# Patient Record
Sex: Male | Born: 1980 | Race: White | Hispanic: No | Marital: Single | Smoking: Former smoker
Health system: Southern US, Community
[De-identification: ages and names within clinical notes are randomized; demographics above are authoritative.]

## PROBLEM LIST (undated history)

## (undated) DIAGNOSIS — F329 Major depressive disorder, single episode, unspecified: Secondary | ICD-10-CM

## (undated) DIAGNOSIS — I10 Essential (primary) hypertension: Secondary | ICD-10-CM

## (undated) DIAGNOSIS — F32A Depression, unspecified: Secondary | ICD-10-CM

## (undated) HISTORY — PX: TONSILLECTOMY: SUR1361

## (undated) HISTORY — DX: Depression, unspecified: F32.A

## (undated) HISTORY — PX: FACIAL RECONSTRUCTION SURGERY: SHX631

## (undated) HISTORY — DX: Major depressive disorder, single episode, unspecified: F32.9

---

## 2001-06-30 ENCOUNTER — Inpatient Hospital Stay (HOSPITAL_COMMUNITY): Admission: EM | Admit: 2001-06-30 | Discharge: 2001-07-05 | Payer: Self-pay | Admitting: Psychiatry

## 2004-08-08 ENCOUNTER — Other Ambulatory Visit: Payer: Self-pay

## 2004-08-08 ENCOUNTER — Emergency Department: Payer: Self-pay | Admitting: Emergency Medicine

## 2008-01-07 ENCOUNTER — Emergency Department: Payer: Self-pay | Admitting: Emergency Medicine

## 2010-10-24 ENCOUNTER — Emergency Department: Payer: Self-pay | Admitting: Emergency Medicine

## 2011-04-08 ENCOUNTER — Emergency Department: Payer: Self-pay | Admitting: Internal Medicine

## 2011-08-18 ENCOUNTER — Encounter: Payer: Self-pay | Admitting: Orthopedic Surgery

## 2011-09-13 ENCOUNTER — Encounter: Payer: Self-pay | Admitting: Orthopedic Surgery

## 2012-09-24 ENCOUNTER — Inpatient Hospital Stay: Payer: Self-pay | Admitting: Psychiatry

## 2012-09-24 LAB — ETHANOL
Ethanol %: 0.153 % — ABNORMAL HIGH (ref 0.000–0.080)
Ethanol: 153 mg/dL

## 2012-09-24 LAB — DRUG SCREEN, URINE
Benzodiazepine, Ur Scrn: NEGATIVE (ref ?–200)
Cannabinoid 50 Ng, Ur ~~LOC~~: NEGATIVE (ref ?–50)
Cocaine Metabolite,Ur ~~LOC~~: NEGATIVE (ref ?–300)
Methadone, Ur Screen: NEGATIVE (ref ?–300)
Opiate, Ur Screen: NEGATIVE (ref ?–300)
Phencyclidine (PCP) Ur S: NEGATIVE (ref ?–25)

## 2012-09-24 LAB — URINALYSIS, COMPLETE
Bilirubin,UR: NEGATIVE
Blood: NEGATIVE
Glucose,UR: NEGATIVE mg/dL (ref 0–75)
Ketone: NEGATIVE
Ph: 5 (ref 4.5–8.0)
Protein: NEGATIVE
Specific Gravity: 1.01 (ref 1.003–1.030)
Squamous Epithelial: NONE SEEN
WBC UR: 1 /HPF (ref 0–5)

## 2012-09-24 LAB — COMPREHENSIVE METABOLIC PANEL
Alkaline Phosphatase: 69 U/L (ref 50–136)
BUN: 21 mg/dL — ABNORMAL HIGH (ref 7–18)
Bilirubin,Total: 0.3 mg/dL (ref 0.2–1.0)
Calcium, Total: 8.6 mg/dL (ref 8.5–10.1)
Chloride: 106 mmol/L (ref 98–107)
Creatinine: 0.95 mg/dL (ref 0.60–1.30)
EGFR (African American): >60
EGFR (Non-African Amer.): >60
Glucose: 123 mg/dL — ABNORMAL HIGH (ref 65–99)
Potassium: 3.5 mmol/L (ref 3.5–5.1)
SGOT(AST): 39 U/L — ABNORMAL HIGH (ref 15–37)
Sodium: 139 mmol/L (ref 136–145)
Total Protein: 7.6 g/dL (ref 6.4–8.2)

## 2012-09-24 LAB — TSH: Thyroid Stimulating Horm: 1.86 u[IU]/mL

## 2012-09-24 LAB — CBC
HCT: 46.7 % (ref 40.0–52.0)
HGB: 15.6 g/dL (ref 13.0–18.0)
MCH: 30.5 pg (ref 26.0–34.0)
MCV: 91 fL (ref 80–100)
Platelet: 252 10*3/uL (ref 150–440)
RBC: 5.11 10*6/uL (ref 4.40–5.90)

## 2012-10-08 ENCOUNTER — Emergency Department: Payer: Self-pay | Admitting: Internal Medicine

## 2012-10-08 ENCOUNTER — Emergency Department: Payer: Self-pay | Admitting: Emergency Medicine

## 2012-10-08 LAB — CBC
MCHC: 34.4 g/dL (ref 32.0–36.0)
Platelet: 269 10*3/uL (ref 150–440)
RDW: 12.5 % (ref 11.5–14.5)

## 2012-10-08 LAB — ETHANOL
Ethanol %: 0.021 % (ref 0.000–0.080)
Ethanol: 21 mg/dL

## 2012-10-08 LAB — DRUG SCREEN, URINE
Amphetamines, Ur Screen: NEGATIVE (ref ?–1000)
Benzodiazepine, Ur Scrn: NEGATIVE (ref ?–200)
Cocaine Metabolite,Ur ~~LOC~~: NEGATIVE (ref ?–300)
Methadone, Ur Screen: NEGATIVE (ref ?–300)
Opiate, Ur Screen: NEGATIVE (ref ?–300)
Phencyclidine (PCP) Ur S: NEGATIVE (ref ?–25)
Tricyclic, Ur Screen: NEGATIVE (ref ?–1000)

## 2012-10-08 LAB — COMPREHENSIVE METABOLIC PANEL
Albumin: 4.5 g/dL (ref 3.4–5.0)
Anion Gap: 7 (ref 7–16)
BUN: 12 mg/dL (ref 7–18)
Calcium, Total: 9.1 mg/dL (ref 8.5–10.1)
Co2: 27 mmol/L (ref 21–32)
Creatinine: 0.78 mg/dL (ref 0.60–1.30)
EGFR (African American): >60
EGFR (Non-African Amer.): >60
Glucose: 101 mg/dL — ABNORMAL HIGH (ref 65–99)
Potassium: 4 mmol/L (ref 3.5–5.1)
SGOT(AST): 28 U/L (ref 15–37)
SGPT (ALT): 25 U/L (ref 12–78)
Sodium: 138 mmol/L (ref 136–145)
Total Protein: 8 g/dL (ref 6.4–8.2)

## 2012-10-08 LAB — ACETAMINOPHEN LEVEL: Acetaminophen: <2 ug/mL

## 2012-10-08 LAB — URINALYSIS, COMPLETE
Bacteria: NONE SEEN
Blood: NEGATIVE
Ketone: NEGATIVE
Protein: NEGATIVE
Specific Gravity: 1.018 (ref 1.003–1.030)

## 2012-10-08 LAB — SALICYLATE LEVEL: Salicylates, Serum: <1.7 mg/dL

## 2012-10-08 LAB — TSH: Thyroid Stimulating Horm: 1.21 u[IU]/mL

## 2013-08-19 ENCOUNTER — Emergency Department: Payer: Self-pay | Admitting: Emergency Medicine

## 2013-08-19 LAB — ACETAMINOPHEN LEVEL: Acetaminophen: <2 ug/mL

## 2013-08-19 LAB — URINALYSIS, COMPLETE
Bilirubin,UR: NEGATIVE
Glucose,UR: NEGATIVE mg/dL (ref 0–75)
KETONE: NEGATIVE
LEUKOCYTE ESTERASE: NEGATIVE
NITRITE: NEGATIVE
PROTEIN: NEGATIVE
Ph: 5 (ref 4.5–8.0)
Specific Gravity: 1.005 (ref 1.003–1.030)

## 2013-08-19 LAB — COMPREHENSIVE METABOLIC PANEL
ALK PHOS: 64 U/L
AST: 44 U/L — AB (ref 15–37)
Albumin: 4 g/dL (ref 3.4–5.0)
Anion Gap: 10 (ref 7–16)
BUN: 16 mg/dL (ref 7–18)
Bilirubin,Total: 0.3 mg/dL (ref 0.2–1.0)
CALCIUM: 7.9 mg/dL — AB (ref 8.5–10.1)
CO2: 23 mmol/L (ref 21–32)
Chloride: 107 mmol/L (ref 98–107)
Creatinine: 0.89 mg/dL (ref 0.60–1.30)
EGFR (Non-African Amer.): >60
Glucose: 119 mg/dL — ABNORMAL HIGH (ref 65–99)
OSMOLALITY: 282 (ref 275–301)
Potassium: 3.7 mmol/L (ref 3.5–5.1)
SGPT (ALT): 40 U/L (ref 12–78)
SODIUM: 140 mmol/L (ref 136–145)
TOTAL PROTEIN: 7.5 g/dL (ref 6.4–8.2)

## 2013-08-19 LAB — CBC
HCT: 47.7 % (ref 40.0–52.0)
HGB: 16.1 g/dL (ref 13.0–18.0)
MCH: 31 pg (ref 26.0–34.0)
MCHC: 33.7 g/dL (ref 32.0–36.0)
MCV: 92 fL (ref 80–100)
PLATELETS: 253 10*3/uL (ref 150–440)
RBC: 5.2 10*6/uL (ref 4.40–5.90)
RDW: 13.3 % (ref 11.5–14.5)
WBC: 7.6 10*3/uL (ref 3.8–10.6)

## 2013-08-19 LAB — DRUG SCREEN, URINE
Amphetamines, Ur Screen: NEGATIVE (ref ?–1000)
Barbiturates, Ur Screen: NEGATIVE (ref ?–200)
Benzodiazepine, Ur Scrn: NEGATIVE (ref ?–200)
CANNABINOID 50 NG, UR ~~LOC~~: NEGATIVE (ref ?–50)
COCAINE METABOLITE, UR ~~LOC~~: NEGATIVE (ref ?–300)
MDMA (Ecstasy)Ur Screen: NEGATIVE (ref ?–500)
METHADONE, UR SCREEN: NEGATIVE (ref ?–300)
Opiate, Ur Screen: NEGATIVE (ref ?–300)
PHENCYCLIDINE (PCP) UR S: NEGATIVE (ref ?–25)
TRICYCLIC, UR SCREEN: NEGATIVE (ref ?–1000)

## 2013-08-19 LAB — ETHANOL
ETHANOL %: 0.263 % — AB (ref 0.000–0.080)
Ethanol: 263 mg/dL

## 2013-08-19 LAB — SALICYLATE LEVEL

## 2013-09-07 ENCOUNTER — Emergency Department: Payer: Self-pay | Admitting: Emergency Medicine

## 2013-09-07 LAB — URINALYSIS, COMPLETE
BACTERIA: NONE SEEN
BILIRUBIN, UR: NEGATIVE
Blood: NEGATIVE
GLUCOSE, UR: NEGATIVE mg/dL (ref 0–75)
Leukocyte Esterase: NEGATIVE
Nitrite: NEGATIVE
PROTEIN: NEGATIVE
Ph: 5 (ref 4.5–8.0)
Specific Gravity: 1.024 (ref 1.003–1.030)
Squamous Epithelial: <1

## 2013-09-15 ENCOUNTER — Emergency Department: Payer: Self-pay | Admitting: Emergency Medicine

## 2013-09-15 LAB — COMPREHENSIVE METABOLIC PANEL
ANION GAP: 4 — AB (ref 7–16)
AST: 36 U/L (ref 15–37)
Albumin: 4.1 g/dL (ref 3.4–5.0)
Alkaline Phosphatase: 70 U/L
BUN: 16 mg/dL (ref 7–18)
Bilirubin,Total: 0.3 mg/dL (ref 0.2–1.0)
CALCIUM: 8.8 mg/dL (ref 8.5–10.1)
Chloride: 102 mmol/L (ref 98–107)
Co2: 31 mmol/L (ref 21–32)
Creatinine: 1 mg/dL (ref 0.60–1.30)
EGFR (Non-African Amer.): >60
GLUCOSE: 78 mg/dL (ref 65–99)
Osmolality: 274 (ref 275–301)
POTASSIUM: 4 mmol/L (ref 3.5–5.1)
SGPT (ALT): 27 U/L (ref 12–78)
SODIUM: 137 mmol/L (ref 136–145)
TOTAL PROTEIN: 7.5 g/dL (ref 6.4–8.2)

## 2013-09-15 LAB — URINALYSIS, COMPLETE
BLOOD: NEGATIVE
Bacteria: NONE SEEN
Bilirubin,UR: NEGATIVE
Glucose,UR: NEGATIVE mg/dL (ref 0–75)
KETONE: NEGATIVE
NITRITE: NEGATIVE
PH: 6 (ref 4.5–8.0)
Protein: NEGATIVE
RBC, UR: NONE SEEN /HPF (ref 0–5)
SQUAMOUS EPITHELIAL: NONE SEEN
Specific Gravity: 1.029 (ref 1.003–1.030)

## 2013-09-15 LAB — ACETAMINOPHEN LEVEL

## 2013-09-15 LAB — SALICYLATE LEVEL: Salicylates, Serum: <1.7 mg/dL

## 2013-09-15 LAB — TROPONIN I: Troponin-I: <0.02 ng/mL

## 2013-09-15 LAB — DRUG SCREEN, URINE
Amphetamines, Ur Screen: NEGATIVE (ref ?–1000)
BARBITURATES, UR SCREEN: NEGATIVE (ref ?–200)
BENZODIAZEPINE, UR SCRN: NEGATIVE (ref ?–200)
Cannabinoid 50 Ng, Ur ~~LOC~~: NEGATIVE (ref ?–50)
Cocaine Metabolite,Ur ~~LOC~~: NEGATIVE (ref ?–300)
MDMA (ECSTASY) UR SCREEN: NEGATIVE (ref ?–500)
METHADONE, UR SCREEN: NEGATIVE (ref ?–300)
Opiate, Ur Screen: NEGATIVE (ref ?–300)
Phencyclidine (PCP) Ur S: NEGATIVE (ref ?–25)
Tricyclic, Ur Screen: NEGATIVE (ref ?–1000)

## 2013-09-15 LAB — CBC
HCT: 44.1 % (ref 40.0–52.0)
HGB: 15 g/dL (ref 13.0–18.0)
MCH: 30.8 pg (ref 26.0–34.0)
MCHC: 33.9 g/dL (ref 32.0–36.0)
MCV: 91 fL (ref 80–100)
PLATELETS: 232 10*3/uL (ref 150–440)
RBC: 4.86 10*6/uL (ref 4.40–5.90)
RDW: 12.9 % (ref 11.5–14.5)
WBC: 6 10*3/uL (ref 3.8–10.6)

## 2013-09-15 LAB — TSH: THYROID STIMULATING HORM: 0.88 u[IU]/mL

## 2013-09-15 LAB — ETHANOL

## 2014-01-01 ENCOUNTER — Encounter: Payer: Self-pay | Admitting: Internal Medicine

## 2014-01-01 ENCOUNTER — Encounter (INDEPENDENT_AMBULATORY_CARE_PROVIDER_SITE_OTHER): Payer: Self-pay

## 2014-01-01 ENCOUNTER — Ambulatory Visit (INDEPENDENT_AMBULATORY_CARE_PROVIDER_SITE_OTHER): Payer: PRIVATE HEALTH INSURANCE | Admitting: Internal Medicine

## 2014-01-01 VITALS — BP 118/74 | HR 61 | Temp 97.9°F | Ht 69.25 in | Wt 185.0 lb

## 2014-01-01 DIAGNOSIS — Z789 Other specified health status: Secondary | ICD-10-CM | POA: Insufficient documentation

## 2014-01-01 DIAGNOSIS — F329 Major depressive disorder, single episode, unspecified: Secondary | ICD-10-CM

## 2014-01-01 DIAGNOSIS — F3289 Other specified depressive episodes: Secondary | ICD-10-CM

## 2014-01-01 DIAGNOSIS — F32A Depression, unspecified: Secondary | ICD-10-CM | POA: Insufficient documentation

## 2014-01-01 LAB — CBC
HCT: 46.3 % (ref 39.0–52.0)
HEMOGLOBIN: 15.7 g/dL (ref 13.0–17.0)
MCHC: 33.9 g/dL (ref 30.0–36.0)
MCV: 93.7 fl (ref 78.0–100.0)
PLATELETS: 240 10*3/uL (ref 150.0–400.0)
RBC: 4.95 Mil/uL (ref 4.22–5.81)
RDW: 12.6 % (ref 11.5–15.5)
WBC: 6 10*3/uL (ref 4.0–10.5)

## 2014-01-01 LAB — COMPREHENSIVE METABOLIC PANEL
ALK PHOS: 54 U/L (ref 39–117)
ALT: 18 U/L (ref 0–53)
AST: 24 U/L (ref 0–37)
Albumin: 4.2 g/dL (ref 3.5–5.2)
BUN: 17 mg/dL (ref 6–23)
CALCIUM: 9.6 mg/dL (ref 8.4–10.5)
CHLORIDE: 100 meq/L (ref 96–112)
CO2: 31 mEq/L (ref 19–32)
CREATININE: 1.1 mg/dL (ref 0.4–1.5)
GFR: 83.72 mL/min (ref >60.00)
Glucose, Bld: 98 mg/dL (ref 70–99)
Potassium: 4.2 mEq/L (ref 3.5–5.1)
Sodium: 135 mEq/L (ref 135–145)
Total Bilirubin: 0.9 mg/dL (ref 0.2–1.2)
Total Protein: 7.3 g/dL (ref 6.0–8.3)

## 2014-01-01 LAB — TSH: TSH: 2.8 u[IU]/mL (ref 0.35–4.50)

## 2014-01-01 NOTE — Progress Notes (Signed)
Pre visit review using our clinic review tool, if applicable. No additional management support is needed unless otherwise documented below in the visit note. 

## 2014-01-01 NOTE — Assessment & Plan Note (Addendum)
R/t anxiety He is concerned about his drinking but does not feel like he can quit at this time Will check liver enzymes

## 2014-01-01 NOTE — Patient Instructions (Addendum)
Depression, Adult Depression refers to feeling sad, low, down in the dumps, blue, gloomy, or empty. In general, there are two kinds of depression: 1. Depression that we all experience from time to time because of upsetting life experiences, including the loss of a job or the ending of a relationship (normal sadness or normal grief). This kind of depression is considered normal, is short lived, and resolves within a few days to 2 weeks. (Depression experienced after the loss of a loved one is called bereavement. Bereavement often lasts longer than 2 weeks but normally gets better with time.) 2. Clinical depression, which lasts longer than normal sadness or normal grief or interferes with your ability to function at home, at work, and in school. It also interferes with your personal relationships. It affects almost every aspect of your life. Clinical depression is an illness. Symptoms of depression also can be caused by conditions other than normal sadness and grief or clinical depression. Examples of these conditions are listed as follows:  Physical illness--Some physical illnesses, including underactive thyroid gland (hypothyroidism), severe anemia, specific types of cancer, diabetes, uncontrolled seizures, heart and lung problems, strokes, and chronic pain are commonly associated with symptoms of depression.  Side effects of some prescription medicine--In some people, certain types of prescription medicine can cause symptoms of depression.  Substance abuse--Abuse of alcohol and illicit drugs can cause symptoms of depression. SYMPTOMS Symptoms of normal sadness and normal grief include the following:  Feeling sad or crying for short periods of time.  Not caring about anything (apathy).  Difficulty sleeping or sleeping too much.  No longer able to enjoy the things you used to enjoy.  Desire to be by oneself all the time (social isolation).  Lack of energy or motivation.  Difficulty  concentrating or remembering.  Change in appetite or weight.  Restlessness or agitation. Symptoms of clinical depression include the same symptoms of normal sadness or normal grief and also the following symptoms:  Feeling sad or crying all the time.  Feelings of guilt or worthlessness.  Feelings of hopelessness or helplessness.  Thoughts of suicide or the desire to harm yourself (suicidal ideation).  Loss of touch with reality (psychotic symptoms). Seeing or hearing things that are not real (hallucinations) or having false beliefs about your life or the people around you (delusions and paranoia). DIAGNOSIS  The diagnosis of clinical depression usually is based on the severity and duration of the symptoms. Your caregiver also will ask you questions about your medical history and substance use to find out if physical illness, use of prescription medicine, or substance abuse is causing your depression. Your caregiver also may order blood tests. TREATMENT  Typically, normal sadness and normal grief do not require treatment. However, sometimes antidepressant medicine is prescribed for bereavement to ease the depressive symptoms until they resolve. The treatment for clinical depression depends on the severity of your symptoms but typically includes antidepressant medicine, counseling with a mental health professional, or a combination of both. Your caregiver will help to determine what treatment is best for you. Depression caused by physical illness usually goes away with appropriate medical treatment of the illness. If prescription medicine is causing depression, talk with your caregiver about stopping the medicine, decreasing the dose, or substituting another medicine. Depression caused by abuse of alcohol or illicit drugs abuse goes away with abstinence from these substances. Some adults need professional help in order to stop drinking or using drugs. SEEK IMMEDIATE CARE IF:  You have thoughts  about   hurting yourself or others.  You lose touch with reality (have psychotic symptoms).  You are taking medicine for depression and have a serious side effect. FOR MORE INFORMATION National Alliance on Mental Illness: www.nami.org National Institute of Mental Health: www.nimh.nih.gov Document Released: 05/28/2000 Document Revised: 11/30/2011 Document Reviewed: 08/30/2011 ExitCare Patient Information 2015 [employer]. This information is not intended to replace advice given to you by your health care provider. Make sure you discuss any questions you have with your health care provider.  

## 2014-01-01 NOTE — Assessment & Plan Note (Addendum)
Would like new referral for psychiatry Referral placed today On paxil and trazadone Will check TSH

## 2014-01-01 NOTE — Progress Notes (Signed)
HPI  Pt present to the clinic today to establish care. He has not had a PCP in many years. He really wants to establish care for a new psychiatrist but was told he would need a referral from a primary care provider.  Flu: never Tetanus: 2012? Dentist: biannually  Past Medical History  Diagnosis Date  . Depression     Current Outpatient Prescriptions  Medication Sig Dispense Refill  . PARoxetine (PAXIL) 20 MG tablet Take 20 mg by mouth daily.      . traZODone (DESYREL) 50 MG tablet Take 50 mg by mouth at bedtime.       No current facility-administered medications for this visit.    No Known Allergies  Family History  Problem Relation Age of Onset  . Hyperlipidemia Mother   . Hyperlipidemia Father   . Alcohol abuse Maternal Grandmother   . Heart disease Maternal Grandmother   . Heart disease Maternal Grandfather   . Alzheimer's disease Paternal Grandfather     History   Social History  . Marital Status: Single    Spouse Name: N/A    Number of Children: N/A  . Years of Education: N/A   Occupational History  . Not on file.   Social History Main Topics  . Smoking status: Current Every Day Smoker -- 0.75 packs/day    Types: Cigarettes  . Smokeless tobacco: Never Used  . Alcohol Use: Yes     Comment: moderate  . Drug Use: No  . Sexual Activity: Not on file   Other Topics Concern  . Not on file   Social History Narrative  . No narrative on file    ROS:  Constitutional: Denies fever, malaise, fatigue, headache or abrupt weight changes.  Psych: Pt reports anxiety and depression. Denies SI/HI.  No other specific complaints in a complete review of systems (except as listed in HPI above).  PE:  BP 118/74  Pulse 61  Temp(Src) 97.9 F (36.6 C) (Oral)  Ht 5' 9.25" (1.759 m)  Wt 185 lb (83.915 kg)  BMI 27.12 kg/m2  SpO2 98% Wt Readings from Last 3 Encounters:  01/01/14 185 lb (83.915 kg)    General: Appears his stated age, in NAD. Cardiovascular:  Normal rate and rhythm. S1,S2 noted.  No murmur, rubs or gallops noted. No JVD or BLE edema. No carotid bruits noted. Pulmonary/Chest: Normal effort and positive vesicular breath sounds. No respiratory distress. No wheezes, rales or ronchi noted.  Psychiatric: Mood and affect normal. Behavior is normal. Judgment and thought content normal.    Assessment and Plan:

## 2014-01-17 ENCOUNTER — Other Ambulatory Visit: Payer: Self-pay

## 2014-01-17 MED ORDER — PAROXETINE HCL 20 MG PO TABS: 20.0000 mg | ORAL_TABLET | Freq: Every day | ORAL | Status: DC

## 2014-01-17 NOTE — Telephone Encounter (Signed)
Called [phone] and advised to call pts cell #. Left v/m requesting cb.

## 2014-01-17 NOTE — Telephone Encounter (Signed)
Pt left v/m requesting refill paxil to [facility] [address]. Nicki Reaperegina Baity NP has never prescribed; Doctor in [place] was prescribing.Please advise.

## 2014-01-18 NOTE — Telephone Encounter (Signed)
Left message advising pt to ck with pharmacy.

## 2014-01-18 NOTE — Telephone Encounter (Signed)
Pt called back and advised to check with pharm and that rx was called in yesterday

## 2014-03-05 ENCOUNTER — Ambulatory Visit: Payer: PRIVATE HEALTH INSURANCE | Admitting: Psychology

## 2014-03-28 ENCOUNTER — Telehealth: Payer: Self-pay

## 2014-03-28 NOTE — Telephone Encounter (Signed)
Pt said for 5 years pt has been taking Paxil 60 mg daily. Pt said when picked up rx was 20 mg a day. Pt has continued taking paxil 60 mg.pt request new rx sent to [facility] [address] with new instructions taking 60 mg daily.Please advise. No cb needed unless cannot get refill. Nicki Reaperegina Baity NP out of office. Please advise.

## 2014-03-28 NOTE — Telephone Encounter (Signed)
Ok to refill one month for now but needs to discuss this with PCP.

## 2014-03-29 NOTE — Telephone Encounter (Signed)
Per Jacob BryantLinda Key pt has appt with Psychologist on 04/02/2014 and pt was instructed by Jacob Kocheregina Key to take 20mg  QD pt was to take as instructed so pt should have refills lasting until January--

## 2014-04-02 ENCOUNTER — Ambulatory Visit: Payer: PRIVATE HEALTH INSURANCE | Admitting: Psychology

## 2014-04-03 ENCOUNTER — Ambulatory Visit (INDEPENDENT_AMBULATORY_CARE_PROVIDER_SITE_OTHER): Payer: PRIVATE HEALTH INSURANCE | Admitting: Psychology

## 2014-04-03 DIAGNOSIS — F1010 Alcohol abuse, uncomplicated: Secondary | ICD-10-CM

## 2014-04-08 ENCOUNTER — Other Ambulatory Visit: Payer: Self-pay | Admitting: Internal Medicine

## 2014-04-08 MED ORDER — PAROXETINE HCL 20 MG PO TABS: 20.0000 mg | ORAL_TABLET | Freq: Every day | ORAL | Status: DC

## 2014-04-08 NOTE — Telephone Encounter (Signed)
Pt left v/m requesting refill paxil; spoke with [facility] [address]; pt picked up paxil #30 on 01/17/14,02/12/14,03/06/14 and 03/22/14.Please advise.

## 2014-04-08 NOTE — Telephone Encounter (Signed)
Ok to send in paxil x 6 months

## 2014-04-08 NOTE — Telephone Encounter (Signed)
Rx sent through [facility]  

## 2014-04-08 NOTE — Addendum Note (Signed)
Addended by: Roena MaladyEVONTENNO, Herman Fiero Y on: 04/08/2014 04:52 PM   Modules accepted: Orders

## 2014-04-16 ENCOUNTER — Ambulatory Visit: Payer: PRIVATE HEALTH INSURANCE | Admitting: Psychology

## 2014-05-07 ENCOUNTER — Ambulatory Visit: Payer: PRIVATE HEALTH INSURANCE | Admitting: Psychology

## 2014-08-05 ENCOUNTER — Other Ambulatory Visit: Payer: Self-pay | Admitting: Internal Medicine

## 2014-08-05 NOTE — Telephone Encounter (Signed)
He sees psych. Only needs yearly follow up with me

## 2014-08-05 NOTE — Telephone Encounter (Signed)
Left message on voicemail.

## 2014-08-05 NOTE — Telephone Encounter (Signed)
Pt left v/m requesting refill paxil to [facility] [address]. Pt last seen (only time seen) 04/08/14. Spoke with Jacki ConesLaurie at [facility] [address] pt got # 30 on 04/08/14,04/28/14,05/19/14,06/09/14,06/26/14 and 07/18/14. No future appt scheduled.Please advise.

## 2014-08-19 IMAGING — CT CT HEAD WITHOUT CONTRAST
1 series · 15 of 29 positions shown, 19 images · non-contrast
Comparison: September 07, 2013

CLINICAL DATA: Dizziness and altered mental status; recent trauma

EXAM:
CT HEAD WITHOUT CONTRAST
TECHNIQUE: Contiguous axial images were obtained from the base of the skull
through the vertex without intravenous contrast. Study was obtained
within 24 hr of patient's arrival at the emergency department.

[Series 2: soft tissue · axial · 0.42mm/px · z∈[-298,-168]mm · 15 of 29 slices shown, 19 images]
[im 2/29  brain]
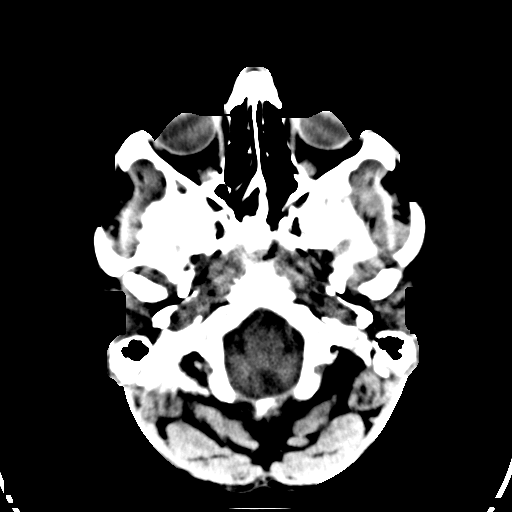
[im 2/29  bone]
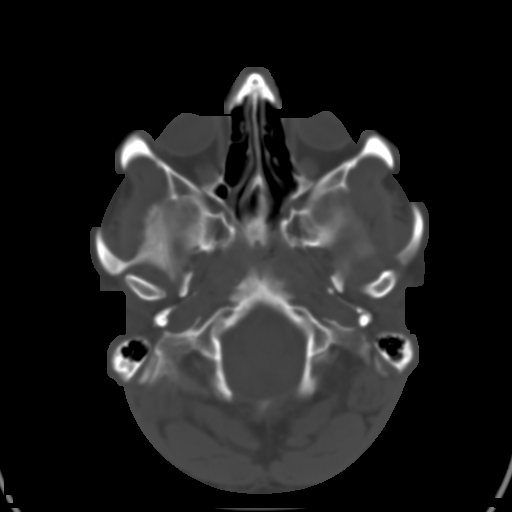
[im 4/29  brain]
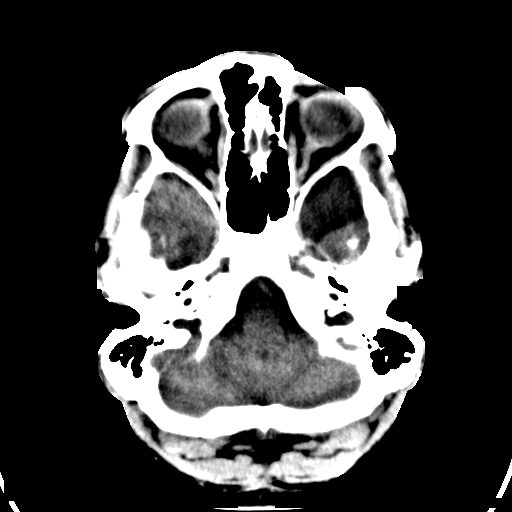
[im 6/29  brain]
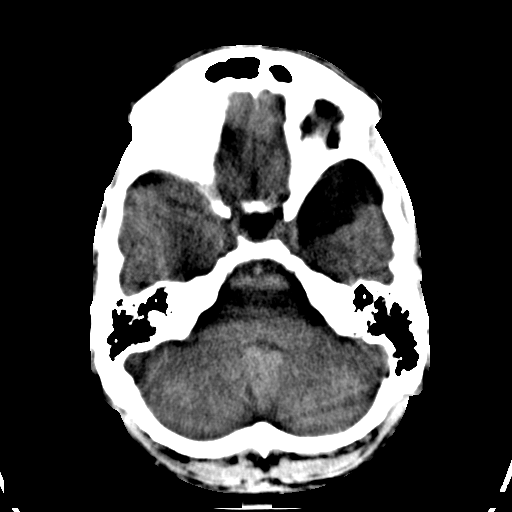
[im 8/29  brain]
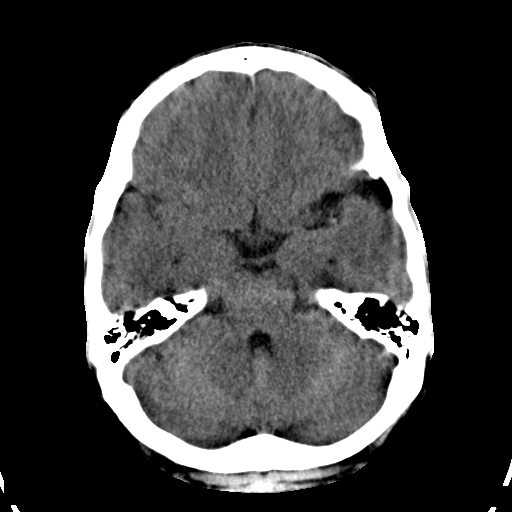
[im 10/29  brain]
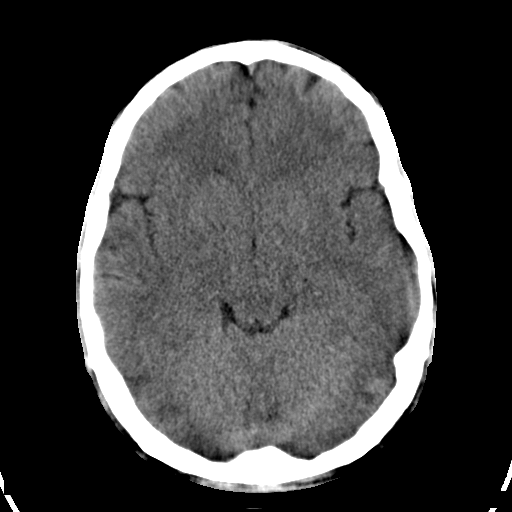
[im 10/29  bone]
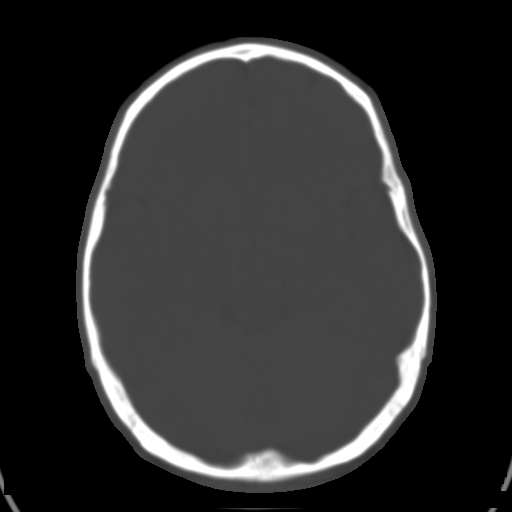
[im 11/29  brain]
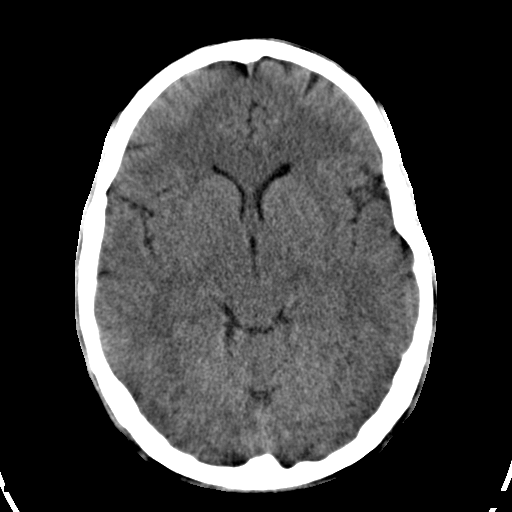
[im 13/29  brain]
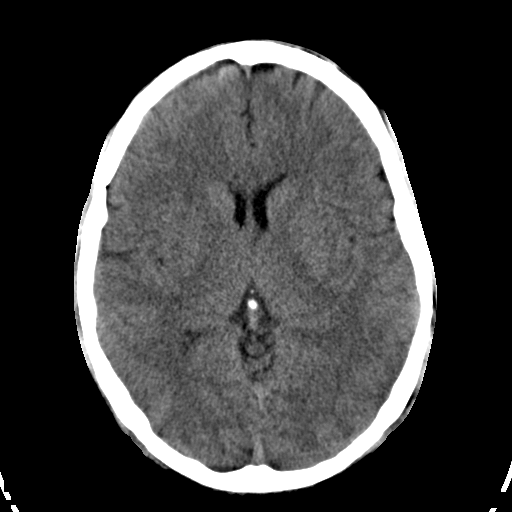
[im 15/29  brain]
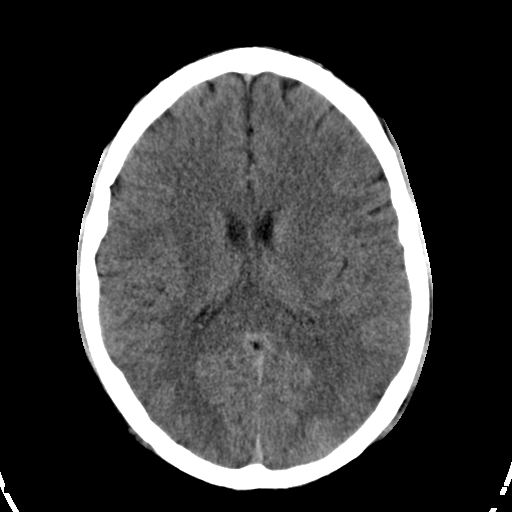
[im 17/29  brain]
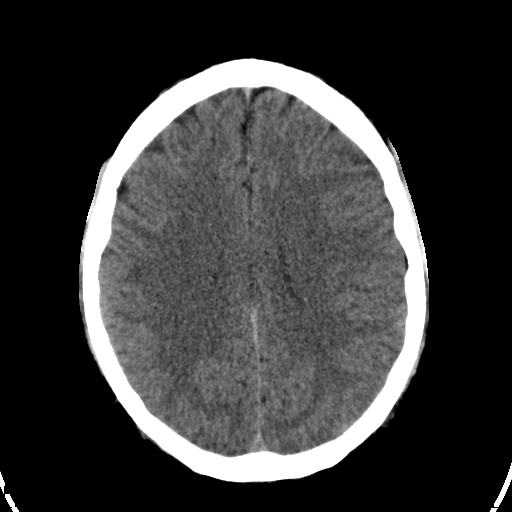
[im 17/29  bone]
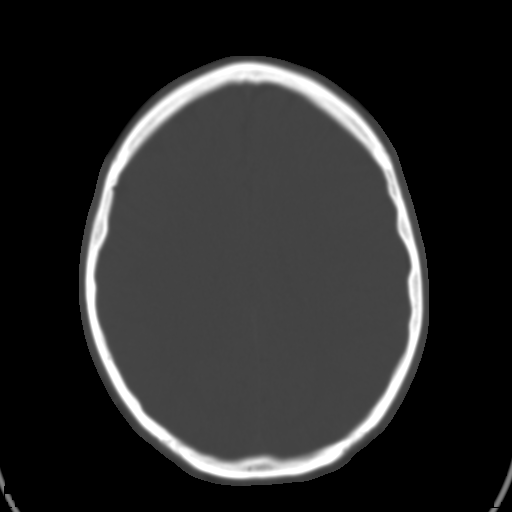
[im 19/29  brain]
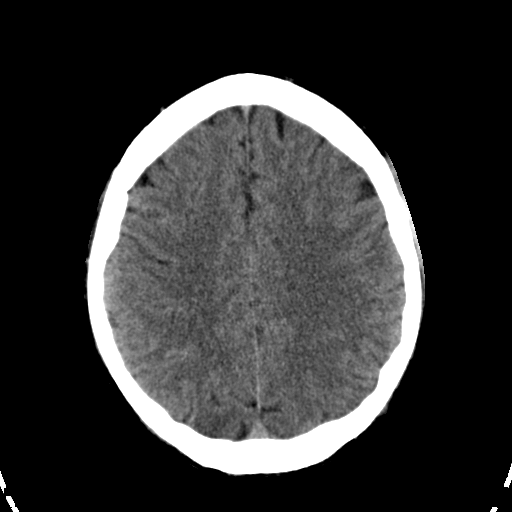
[im 20/29  brain]
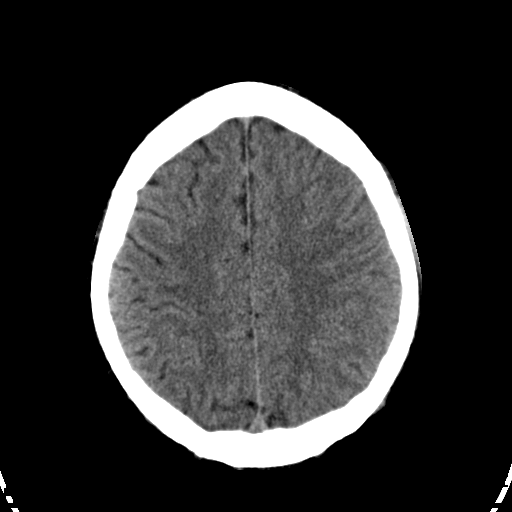
[im 22/29  brain]
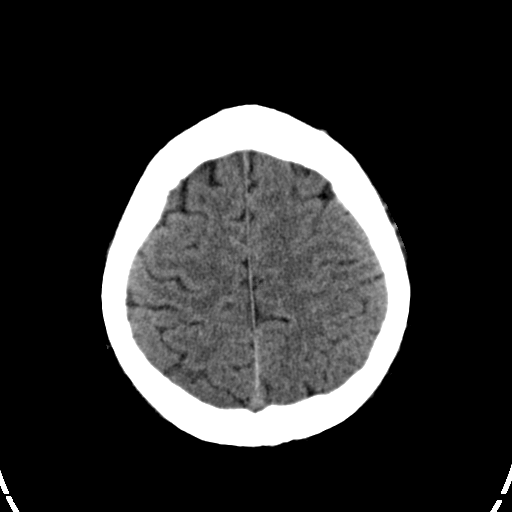
[im 24/29  brain]
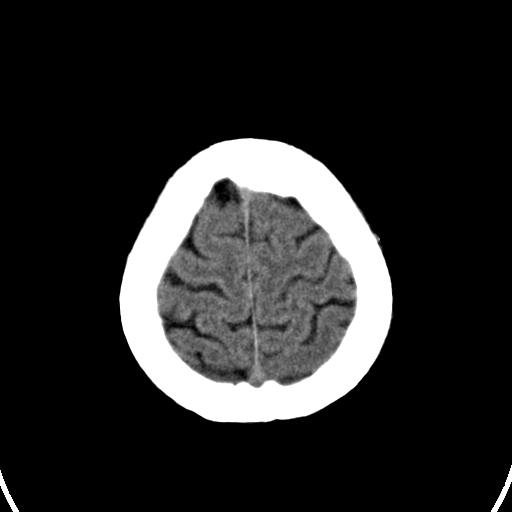
[im 24/29  bone]
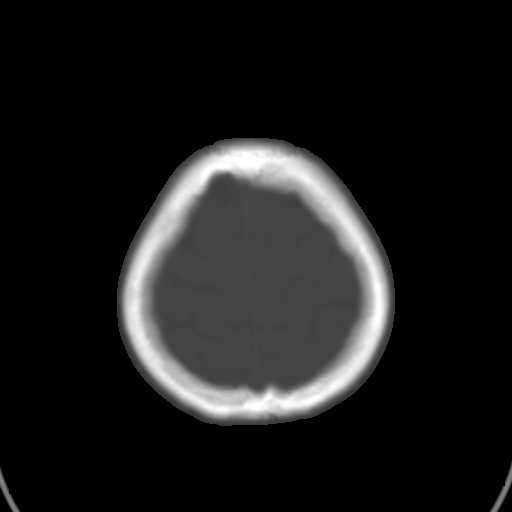
[im 26/29  brain]
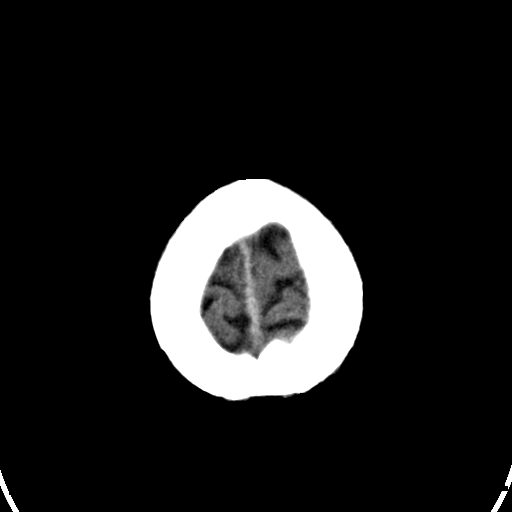
[im 28/29  brain]
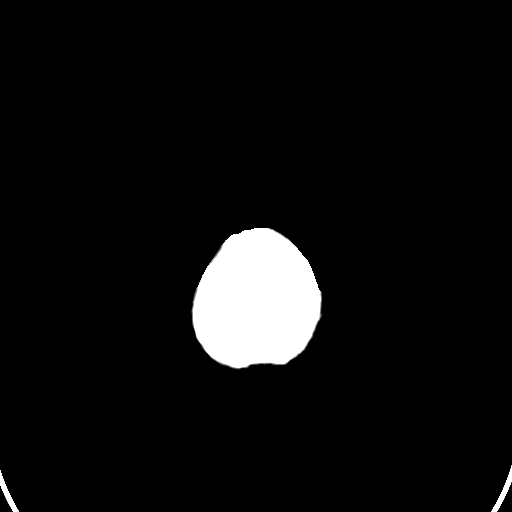

[15 of 29 positions shown; findings below may reference images not displayed]

FINDINGS: The ventricles are normal in size and configuration. There is a
degree of empty sella. There is a cystic lesion arising in the
anterior left temporal lobe measuring 4.0 x 2.1 cm consistent with
an arachnoid cyst. There is chronic appearing remodeling of the
anterior inferior left temporal lobe due to this apparent arachnoid
cyst, stable. There is no other mass. There is no hemorrhage,
extra-axial fluid collection, or midline shift. Gray-white
compartments appear normal. No acute infarct apparent. Bony
calvarium appears intact. The mastoid air cells are clear.
IMPRESSION: Apparent arachnoid cyst in the anterior left temporal lobe. There is
a degree of empty sella. There is no intracranial hemorrhage or
extra-axial fluid collection. No acute appearing infarct.

## 2014-08-20 ENCOUNTER — Ambulatory Visit: Payer: PRIVATE HEALTH INSURANCE | Admitting: Psychology
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
## 2014-10-05 NOTE — Consult Note (Signed)
PATIENT NAME:  [name], Jacob MR#:  [id number] DATE OF BIRTH:  [birth year]  DATE OF CONSULTATION:  08/19/2013  REFERRING PHYSICIAN:   CONSULTING PHYSICIAN:  Jinna Weinman K. Guss Bundehalla, MD  DATE OF DICTATION: 08/19/2013    PLACE OF DICTATION: [facility], [address], [place], [place].   AGE: 34 years   SEX: Male   RACE: White   SUBJECTIVE: The patient was seen in consultation in room #21A in Emergency Room at [facility]. 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 The patient just recently moved from [place]. Not married and has a friend. The patient reports that in [place] a physician prescribed him trazodone 50 mg to help him sleep, and last night, he felt nervous and anxious. He took a second tablet of trazodone and he also drank alcohol and got agitated and was brought here for help and IVC was taken out.   PAST PSYCHIATRIC HISTORY: No previous inpatient psychiatry. No history of suicide attempts. Not being followed by any psychiatrist. Got his trazodone 50 mg p.o. at bedtime from primary care physician in [place] as he just recently moved from [place].   ALCOHOL AND DRUGS: Drinks alcohol occasionally, not on a regular basis. Denies any other street or prescription drug abuse. Denies smoking nicotine cigarettes.   MENTAL STATUS: The patient is dressed in hospital scrubs. Alert and oriented. Calm, pleasant and cooperative. Affect is neutral. Mood stable. Denies feeling depressed. Denies feeling hopeless or helpless. No psychosis. Denies suicidal or homicidal plans. Insight and judgment fair.   IMPRESSION: Alcohol abuse with intoxication and took 2 tablets of trazodone 50 mg for anxiety and to help him rest.   PLAN: Discontinue IVC. Discharge the patient. He will go back to take care of the elderly man and will find a physician in the community for followup.   ____________________________ Jannet MantisSurya K. Guss Bunde[place]alla, MD skc:gb D: 08/19/2013 20:49:56  ET T: 08/20/2013 04:00:08 ET JOB#: [id number]  cc: Monika SalkSurya K. Guss Bunde[place]alla, MD, <Dictator> Beau FannySURYA K Reana Chacko MD ELECTRONICALLY SIGNED 08/22/2013 15:24

## 2014-11-15 ENCOUNTER — Other Ambulatory Visit: Payer: Self-pay

## 2014-11-15 NOTE — Telephone Encounter (Signed)
Pt left v/m requesting refill on paroxetine to [facility] [address]. Spoke with [facility] and pt got paroxetine # 30 refilled on 08/05/14,08/23/14, 09/14/14, 10/01/14 and 10/28/14. Please advise.

## 2014-11-18 MED ORDER — PAROXETINE HCL 20 MG PO TABS: 20.0000 mg | ORAL_TABLET | Freq: Every day | ORAL | Status: DC

## 2014-11-18 NOTE — Telephone Encounter (Signed)
Needs follow up in July

## 2014-11-19 ENCOUNTER — Telehealth: Payer: Self-pay | Admitting: Internal Medicine

## 2014-11-19 NOTE — Telephone Encounter (Signed)
Patient called asking about his rx.  I let him know the rx was called in to [facility] yesterday and he scheduled a follow up appointment on 12/26/14.

## 2014-12-26 ENCOUNTER — Ambulatory Visit: Payer: PRIVATE HEALTH INSURANCE | Admitting: Internal Medicine

## 2014-12-30 ENCOUNTER — Encounter: Payer: PRIVATE HEALTH INSURANCE | Admitting: Internal Medicine

## 2015-01-06 ENCOUNTER — Telehealth: Payer: Self-pay

## 2015-01-06 MED ORDER — PAROXETINE HCL 20 MG PO TABS: 20.0000 mg | ORAL_TABLET | Freq: Every day | ORAL | Status: DC

## 2015-01-06 NOTE — Telephone Encounter (Signed)
Pt request refill on paroxetine to [facility] [address]. Pt has cpx scheduled on 01/22/15 with Nicki Reaper NP; pt had 2 no shows on 12/26/14 and 12/30/14.Please advise. Pt seen 01/01/14 to establish care.

## 2015-01-06 NOTE — Telephone Encounter (Signed)
Will give 30 day supply If he no shows again, will dismiss from practice

## 2015-01-22 ENCOUNTER — Encounter: Payer: PRIVATE HEALTH INSURANCE | Admitting: Internal Medicine

## 2015-01-27 ENCOUNTER — Encounter: Payer: Self-pay | Admitting: Internal Medicine

## 2015-01-27 NOTE — Telephone Encounter (Signed)
Pt left v/m requesting refill on paxil to [facility] [address]. Pt apologized for missing appts. Appears pt no showed on 01/22/15.Please advise.

## 2015-01-27 NOTE — Addendum Note (Signed)
Addended by: Patience Musca on: 01/27/2015 03:14 PM   Modules accepted: Orders

## 2015-01-27 NOTE — Telephone Encounter (Signed)
He has been counseled on multiple occasion about the importance of keeping his appts. He needs to be dismissed for multiple no shows.

## 2015-01-28 ENCOUNTER — Other Ambulatory Visit: Payer: Self-pay | Admitting: Internal Medicine

## 2015-01-28 NOTE — Telephone Encounter (Signed)
Pt left v/m requesting cb about medication refill; advised pt via v/m to check with pharmacy about v/m question, anything further pt to cb.

## 2015-01-28 NOTE — Telephone Encounter (Signed)
Would you like to send in 30 day supply?--please advise

## 2015-01-29 ENCOUNTER — Telehealth: Payer: Self-pay | Admitting: Internal Medicine

## 2015-01-29 NOTE — Telephone Encounter (Signed)
Patient dismissed from [facility] by [facility] NP-C , effective January 27, 2015. Dismissal letter sent out by certified / registered mail.  DAJ  Received signed domestic return receipt verifying delivery of certified letter on January 31, 2015. Article number [phone] [id number]

## 2015-02-10 NOTE — Telephone Encounter (Signed)
We did  °

## 2015-02-10 NOTE — Telephone Encounter (Signed)
Patient will be dismissed due to no shows.  We are his provider for 30 more days for acute care.  Will we refill his Paxil?

## 2015-02-19 ENCOUNTER — Telehealth: Payer: Self-pay

## 2015-02-19 NOTE — Telephone Encounter (Signed)
We have from the time of his first appt, 20 mg daily. Not 60 mg daily. Can you call the pharmacy to verify and see if he has ever taken > 20 mg daily.

## 2015-02-19 NOTE — Telephone Encounter (Signed)
Pt said since he began seeing Nicki Reaper NP pt has been taking paxil 20 mg three tabs after lunch; pt is out of med and request refill to [facility] [address]. Pt has been without med for 2 days and does not want to go thru withdrawals. Pt request one more refill of med before dismissed from practice. Advised pt that the prescribed med of Paxil 20 mg should be taken only once daily according to instructions. Pt said he does not know why the instructions were never corrected. Pt request cb.

## 2015-02-20 ENCOUNTER — Other Ambulatory Visit: Payer: Self-pay | Admitting: Internal Medicine

## 2015-02-20 NOTE — Telephone Encounter (Signed)
I called [facility] and they stated dated back to 03/2014, it has been prescribed as  QD---not showing any other instructions

## 2015-02-20 NOTE — Telephone Encounter (Signed)
We will not given any further refills. He was dismissed for multiple no shows. If he needs to get his meds refilled he can go to [facility].

## 2015-02-20 NOTE — Telephone Encounter (Signed)
Rx had already been filled one last time since discharge on 01/28/2015--pt should not be out of medication

## 2015-03-20 ENCOUNTER — Encounter: Payer: Self-pay | Admitting: Family Medicine

## 2015-03-20 ENCOUNTER — Ambulatory Visit (INDEPENDENT_AMBULATORY_CARE_PROVIDER_SITE_OTHER): Payer: PRIVATE HEALTH INSURANCE | Admitting: Family Medicine

## 2015-03-20 VITALS — BP 128/76 | HR 57 | Temp 97.1°F | Ht 69.75 in | Wt 198.2 lb

## 2015-03-20 DIAGNOSIS — Z13220 Encounter for screening for lipoid disorders: Secondary | ICD-10-CM

## 2015-03-20 DIAGNOSIS — F329 Major depressive disorder, single episode, unspecified: Secondary | ICD-10-CM

## 2015-03-20 DIAGNOSIS — F32A Depression, unspecified: Secondary | ICD-10-CM

## 2015-03-20 DIAGNOSIS — R52 Pain, unspecified: Secondary | ICD-10-CM

## 2015-03-20 MED ORDER — PAROXETINE HCL 20 MG PO TABS: 20.0000 mg | ORAL_TABLET | Freq: Every day | ORAL | Status: DC

## 2015-03-20 NOTE — Assessment & Plan Note (Signed)
Will get him restarted on his paxil. Will start with  daily and will increase as needed per symptoms. Will follow up in 2-3 weeks to see how he is doing.

## 2015-03-20 NOTE — Progress Notes (Signed)
BP 128/76 mmHg  Pulse 57  Temp(Src) 97.1 F (36.2 C)  Ht 5' 9.75" (1.772 m)  Wt 198 lb 3.2 oz (89.903 kg)  BMI 28.63 kg/m2  SpO2 99%   Subjective:    Patient ID: Jacob Key, male    DOB: Sep 09, 1980, 34 y.o.   MRN: [id number]  HPI: Jacob Key is a 34 y.o. male  Chief Complaint  Patient presents with  . Establish Care    New Pt   2 weeks has been having bad body aches. Works outside a lot, doesn't work outside, Does have dogs at home. Did stop his paxil about a month ago and is not sure if this is what's going on.   DEPRESSION- has been off his paxil since the beginning of September. Attempted suicide when he was 54 Mood status: exacerbated Satisfied with current treatment?: did well on the paxil, but has been off it for about a month Symptom severity: moderate  Duration of current treatment : chronic Side effects: no Psychotherapy/counseling: yes in the past Depressed mood: yes Anxious mood: yes Anhedonia: no Significant weight loss or gain: no Insomnia: no  Fatigue: no Feelings of worthlessness or guilt: no Impaired concentration/indecisiveness: no Suicidal ideations: no Hopelessness: no Crying spells: no Depression screen PHQ 2/9 01/01/2014  Decreased Interest 0  Down, Depressed, Hopeless 0  PHQ - 2 Score 0   Relevant past medical, surgical, family and social history reviewed and updated as indicated. Interim medical history since our last visit reviewed. Allergies and medications reviewed and updated.  Review of Systems  Constitutional: Negative.   Respiratory: Negative.   Cardiovascular: Negative.   Gastrointestinal: Negative.   Musculoskeletal: Positive for myalgias. Negative for back pain, joint swelling, arthralgias, gait problem, neck pain and neck stiffness.  Skin: Negative.   Psychiatric/Behavioral: Negative.     Per HPI unless specifically indicated above     Objective:    BP 128/76 mmHg  Pulse 57  Temp(Src) 97.1 F (36.2 C)  Ht 5'  9.75" (1.772 m)  Wt 198 lb 3.2 oz (89.903 kg)  BMI 28.63 kg/m2  SpO2 99%  Wt Readings from Last 3 Encounters:  03/20/15 198 lb 3.2 oz (89.903 kg)  01/01/14 185 lb (83.915 kg)    Physical Exam  Constitutional: He is oriented to person, place, and time. He appears well-developed and well-nourished. No distress.  HENT:  Head: Normocephalic and atraumatic.  Right Ear: Hearing normal.  Left Ear: Hearing normal.  Nose: Nose normal.  Eyes: Conjunctivae and lids are normal. Right eye exhibits no discharge. Left eye exhibits no discharge. No scleral icterus.  Pulmonary/Chest: Effort normal. No respiratory distress.  Musculoskeletal: Normal range of motion.  Neurological: He is alert and oriented to person, place, and time.  Skin: Skin is intact. No rash noted.  Psychiatric: He has a normal mood and affect. His speech is normal and behavior is normal. Judgment and thought content normal. Cognition and memory are normal.    Results for orders placed or performed in visit on 01/01/14  CBC  Result Value Ref Range   WBC 6.0 4.0 - 10.5 K/uL   RBC 4.95 4.22 - 5.81 Mil/uL   Platelets 240.0 150.0 - 400.0 K/uL   Hemoglobin 15.7 13.0 - 17.0 g/dL   HCT 78.2 95.6 - 21.3 %   MCV 93.7 78.0 - 100.0 fl   MCHC 33.9 30.0 - 36.0 g/dL   RDW 08.6 57.8 - 46.9 %  Comprehensive metabolic panel  Result Value Ref Range  Sodium 135 135 - 145 mEq/L   Potassium 4.2 3.5 - 5.1 mEq/L   Chloride 100 96 - 112 mEq/L   CO2 31 19 - 32 mEq/L   Glucose, Bld 98 70 - 99 mg/dL   BUN 17 6 - 23 mg/dL   Creatinine, Ser 1.1 0.4 - 1.5 mg/dL   Total Bilirubin 0.9 0.2 - 1.2 mg/dL   Alkaline Phosphatase 54 39 - 117 U/L   AST 24 0 - 37 U/L   ALT 18 0 - 53 U/L   Total Protein 7.3 6.0 - 8.3 g/dL   Albumin 4.2 3.5 - 5.2 g/dL   Calcium 9.6 8.4 - 16.1 mg/dL   GFR 09.60 >45.40 mL/min  TSH  Result Value Ref Range   TSH 2.80 0.35 - 4.50 uIU/mL      Assessment & Plan:   Problem List Items Addressed This Visit      Other    Depression - Primary    Will get him restarted on his paxil. Will start with  daily and will increase as needed per symptoms. Will follow up in 2-3 weeks to see how he is doing.       Relevant Medications   PARoxetine (PAXIL) 20 MG tablet   Other Relevant Orders   CBC with Differential/Platelet   Comprehensive metabolic panel   Lipid Panel w/o Chol/HDL Ratio   TSH   Vit D  25 hydroxy (rtn osteoporosis monitoring)   Lyme Ab/Western Blot Reflex   Rocky mtn spotted fvr abs pnl(IgG+IgM)   Babesia microti Antibody Panel   Ehrlichia Antibody Panel    Other Visit Diagnoses    Body aches        Possibly due to paxil withdrawl. Will check labs including tick-bourne diseases and vitamin D and CMP. Await results. Continue to monitor.     Relevant Orders    CBC with Differential/Platelet    Comprehensive metabolic panel    Lipid Panel w/o Chol/HDL Ratio    TSH    Vit D  25 hydroxy (rtn osteoporosis monitoring)    Lyme Ab/Western Blot Reflex    Rocky mtn spotted fvr abs pnl(IgG+IgM)    Babesia microti Antibody Panel    Ehrlichia Antibody Panel    Screening for cholesterol level        Has not eaten. Will check cholesterol today. Await results.     Relevant Orders    CBC with Differential/Platelet    Comprehensive metabolic panel    Lipid Panel w/o Chol/HDL Ratio    TSH    Vit D  25 hydroxy (rtn osteoporosis monitoring)    Lyme Ab/Western Blot Reflex    Rocky mtn spotted fvr abs pnl(IgG+IgM)    Babesia microti Antibody Panel    Ehrlichia Antibody Panel        Follow up plan: Return 2-3 weeks, for Physical and follow up depression.

## 2015-03-21 LAB — CBC WITH DIFFERENTIAL/PLATELET
Basophils Absolute: 0 10*3/uL (ref 0.0–0.2)
Basos: 0 %
EOS (ABSOLUTE): 0.2 10*3/uL (ref 0.0–0.4)
Eos: 4 %
Hematocrit: 44.2 % (ref 37.5–51.0)
Hemoglobin: 15.7 g/dL (ref 12.6–17.7)
Immature Grans (Abs): 0 10*3/uL (ref 0.0–0.1)
Immature Granulocytes: 0 %
LYMPHS: 31 %
Lymphocytes Absolute: 1.6 10*3/uL (ref 0.7–3.1)
MCH: 31.8 pg (ref 26.6–33.0)
MCHC: 35.5 g/dL (ref 31.5–35.7)
MCV: 90 fL (ref 79–97)
Monocytes Absolute: 0.4 10*3/uL (ref 0.1–0.9)
Monocytes: 7 %
NEUTROS ABS: 3 10*3/uL (ref 1.4–7.0)
Neutrophils: 58 %
Platelets: 243 10*3/uL (ref 150–379)
RBC: 4.94 x10E6/uL (ref 4.14–5.80)
RDW: 13.7 % (ref 12.3–15.4)
WBC: 5.2 10*3/uL (ref 3.4–10.8)

## 2015-03-21 LAB — LIPID PANEL W/O CHOL/HDL RATIO
CHOLESTEROL TOTAL: 249 mg/dL — AB (ref 100–199)
HDL: 44 mg/dL (ref >39)
LDL Calculated: 157 mg/dL — ABNORMAL HIGH (ref 0–99)
Triglycerides: 240 mg/dL — ABNORMAL HIGH (ref 0–149)
VLDL CHOLESTEROL CAL: 48 mg/dL — AB (ref 5–40)

## 2015-03-21 LAB — COMPREHENSIVE METABOLIC PANEL
ALK PHOS: 65 IU/L (ref 39–117)
ALT: 25 IU/L (ref 0–44)
AST: 25 IU/L (ref 0–40)
Albumin/Globulin Ratio: 1.9 (ref 1.1–2.5)
Albumin: 4.4 g/dL (ref 3.5–5.5)
BILIRUBIN TOTAL: 0.5 mg/dL (ref 0.0–1.2)
BUN/Creatinine Ratio: 20 — ABNORMAL HIGH (ref 8–19)
BUN: 20 mg/dL (ref 6–20)
CHLORIDE: 99 mmol/L (ref 97–108)
CO2: 26 mmol/L (ref 18–29)
Calcium: 9.8 mg/dL (ref 8.7–10.2)
Creatinine, Ser: 0.98 mg/dL (ref 0.76–1.27)
GFR calc Af Amer: 116 mL/min/{1.73_m2} (ref >59)
GFR calc non Af Amer: 100 mL/min/{1.73_m2} (ref >59)
GLUCOSE: 96 mg/dL (ref 65–99)
Globulin, Total: 2.3 g/dL (ref 1.5–4.5)
Potassium: 4.6 mmol/L (ref 3.5–5.2)
Sodium: 140 mmol/L (ref 134–144)
Total Protein: 6.7 g/dL (ref 6.0–8.5)

## 2015-03-21 LAB — EHRLICHIA ANTIBODY PANEL
E. CHAFFEENSIS (HME) IGM TITER: NEGATIVE
E.Chaffeensis (HME) IgG: NEGATIVE
HGE IGG TITER: NEGATIVE
HGE IGM TITER: NEGATIVE

## 2015-03-21 LAB — LYME AB/WESTERN BLOT REFLEX
LYME DISEASE AB, QUANT, IGM: <0.8 index (ref 0.00–0.79)
Lyme IgG/IgM Ab: <0.91 {ISR} (ref 0.00–0.90)

## 2015-03-21 LAB — VITAMIN D 25 HYDROXY (VIT D DEFICIENCY, FRACTURES): Vit D, 25-Hydroxy: 31.4 ng/mL (ref 30.0–100.0)

## 2015-03-24 ENCOUNTER — Encounter: Payer: Self-pay | Admitting: Family Medicine

## 2015-03-24 ENCOUNTER — Telehealth: Payer: Self-pay | Admitting: Family Medicine

## 2015-03-24 DIAGNOSIS — B6000 Babesiosis, unspecified: Secondary | ICD-10-CM | POA: Insufficient documentation

## 2015-03-24 DIAGNOSIS — E785 Hyperlipidemia, unspecified: Secondary | ICD-10-CM | POA: Insufficient documentation

## 2015-03-24 LAB — BABESIA MICROTI ANTIBODY PANEL

## 2015-03-24 MED ORDER — ATOVAQUONE 750 MG/5ML PO SUSP: 750.0000 mg | Freq: Two times a day (BID) | ORAL | Status: DC

## 2015-03-24 MED ORDER — AZITHROMYCIN 250 MG PO TABS: ORAL_TABLET | ORAL | Status: DC

## 2015-03-24 NOTE — Telephone Encounter (Signed)
Discussed results with patient. Will start medicine. Will obtain peripheral smear next visit to confirm eradication.

## 2015-03-25 ENCOUNTER — Telehealth: Payer: Self-pay

## 2015-03-25 LAB — ROCKY MTN SPOTTED FVR ABS PNL(IGG+IGM): RMSF IGG: UNDETERMINED

## 2015-03-25 LAB — RMSF, IGG, IFA

## 2015-03-25 NOTE — Telephone Encounter (Signed)
Patient called, he would like to speak with you about the tick disease.  He would like to know the name of it, also he states that the antibiotic is really expensive, he can't afford it.

## 2015-03-25 NOTE — Telephone Encounter (Signed)
Called patient. He is feeling better since getting on the paxil- concerned if he needs to take this medicine. Will check peripheral smear to look for acute infection. Will do quinine and clindamycin if positive for cost.

## 2015-03-26 ENCOUNTER — Other Ambulatory Visit: Payer: Self-pay | Admitting: Family Medicine

## 2015-03-26 ENCOUNTER — Other Ambulatory Visit: Payer: PRIVATE HEALTH INSURANCE

## 2015-03-26 DIAGNOSIS — B6000 Babesiosis, unspecified: Secondary | ICD-10-CM

## 2015-03-28 LAB — PATHOLOGIST SMEAR REVIEW
Path Rev PLTs: NORMAL
Path Rev RBC: NORMAL
Path Rev WBC: NORMAL

## 2015-03-31 ENCOUNTER — Encounter: Payer: Self-pay | Admitting: Family Medicine

## 2015-03-31 ENCOUNTER — Telehealth: Payer: Self-pay | Admitting: Family Medicine

## 2015-03-31 NOTE — Telephone Encounter (Signed)
Called Jacob Key and gave him results of negative smear. Likely exposed to babesia in the past, but doesn't appear to have active infection. He is aware. Feeling better since back on his paxil.

## 2015-04-04 ENCOUNTER — Encounter: Payer: PRIVATE HEALTH INSURANCE | Admitting: Family Medicine

## 2015-04-11 ENCOUNTER — Encounter: Payer: Self-pay | Admitting: Family Medicine

## 2015-04-11 ENCOUNTER — Ambulatory Visit (INDEPENDENT_AMBULATORY_CARE_PROVIDER_SITE_OTHER): Payer: PRIVATE HEALTH INSURANCE | Admitting: Family Medicine

## 2015-04-11 VITALS — BP 134/78 | HR 67 | Temp 98.5°F | Ht 68.7 in | Wt 195.0 lb

## 2015-04-11 DIAGNOSIS — F32A Depression, unspecified: Secondary | ICD-10-CM

## 2015-04-11 DIAGNOSIS — F329 Major depressive disorder, single episode, unspecified: Secondary | ICD-10-CM | POA: Diagnosis not present

## 2015-04-11 DIAGNOSIS — Z0000 Encounter for general adult medical examination without abnormal findings: Secondary | ICD-10-CM | POA: Diagnosis not present

## 2015-04-11 NOTE — Patient Instructions (Signed)
Fat and Cholesterol Restricted Diet  Getting too much fat and cholesterol in your diet may cause health problems. Following this diet helps keep your fat and cholesterol at normal levels. This can keep you from getting sick.  WHAT TYPES OF FAT SHOULD I CHOOSE?  · Choose monosaturated and polyunsaturated fats. These are found in foods such as olive oil, canola oil, flaxseeds, walnuts, almonds, and seeds.  · Eat more omega-3 fats. Good choices include salmon, mackerel, sardines, tuna, flaxseed oil, and ground flaxseeds.  · Limit saturated fats. These are in animal products such as meats, butter, and cream. They can also be in plant products such as palm oil, palm kernel oil, and coconut oil.  ·  Avoid foods with partially hydrogenated oils in them. These contain trans fats. Examples of foods that have trans fats are stick margarine, some tub margarines, cookies, crackers, and other baked goods.  WHAT GENERAL GUIDELINES DO I NEED TO FOLLOW?   · Check food labels. Look for the words "trans fat" and "saturated fat."  · When preparing a meal:    Fill half of your plate with vegetables and green salads.    Fill one fourth of your plate with whole grains. Look for the word "whole" as the first word in the ingredient list.    Fill one fourth of your plate with lean protein foods.  · Limit fruit to two servings a day. Choose fruit instead of juice.  · Eat more foods with soluble fiber. Examples of foods with this type of fiber are apples, broccoli, carrots, beans, peas, and barley. Try to get 20-30 g (grams) of fiber per day.  · Eat more home-cooked foods. Eat less at restaurants and buffets.  · Limit or avoid alcohol.  · Limit foods high in starch and sugar.  · Limit fried foods.  · Cook foods without frying them. Baking, boiling, grilling, and broiling are all great options.  · Lose weight if you are overweight. Losing even a small amount of weight can help your overall health. It can also help prevent diseases such as  diabetes and heart disease.  WHAT FOODS CAN I EAT?  Grains  Whole grains, such as whole wheat or whole grain breads, crackers, cereals, and pasta. Unsweetened oatmeal, bulgur, barley, quinoa, or brown rice. Corn or whole wheat flour tortillas.  Vegetables  Fresh or frozen vegetables (raw, steamed, roasted, or grilled). Green salads.  Fruits  All fresh, canned (in natural juice), or frozen fruits.  Meat and Other Protein Products  Ground beef (85% or leaner), grass-fed beef, or beef trimmed of fat. Skinless chicken or turkey. Ground chicken or turkey. Pork trimmed of fat. All fish and seafood. Eggs. Dried beans, peas, or lentils. Unsalted nuts or seeds. Unsalted canned or dry beans.  Dairy  Low-fat dairy products, such as skim or 1% milk, 2% or reduced-fat cheeses, low-fat ricotta or cottage cheese, or plain low-fat yogurt.  Fats and Oils  Tub margarines without trans fats. Light or reduced-fat mayonnaise and salad dressings. Avocado. Olive, canola, sesame, or safflower oils. Natural peanut or almond butter (choose ones without added sugar and oil).  The items listed above may not be a complete list of recommended foods or beverages. Contact your dietitian for more options.  WHAT FOODS ARE NOT RECOMMENDED?  Grains  White bread. White pasta. White rice. Cornbread. Bagels, pastries, and croissants. Crackers that contain trans fat.  Vegetables  White potatoes. Corn. Creamed or fried vegetables. Vegetables in a cheese sauce.    Fruits  Dried fruits. Canned fruit in light or heavy syrup. Fruit juice.  Meat and Other Protein Products  Fatty cuts of meat. Ribs, chicken wings, bacon, sausage, bologna, salami, chitterlings, fatback, hot dogs, bratwurst, and packaged luncheon meats. Liver and organ meats.  Dairy  Whole or 2% milk, cream, half-and-half, and cream cheese. Whole milk cheeses. Whole-fat or sweetened yogurt. Full-fat cheeses. Nondairy creamers and whipped toppings. Processed cheese, cheese spreads, or cheese  curds.  Sweets and Desserts  Corn syrup, sugars, honey, and molasses. Candy. Jam and jelly. Syrup. Sweetened cereals. Cookies, pies, cakes, donuts, muffins, and ice cream.  Fats and Oils  Butter, stick margarine, lard, shortening, ghee, or bacon fat. Coconut, palm kernel, or palm oils.  Beverages  Alcohol. Sweetened drinks (such as sodas, lemonade, and fruit drinks or punches).  The items listed above may not be a complete list of foods and beverages to avoid. Contact your dietitian for more information.     This information is not intended to replace advice given to you by your health care provider. Make sure you discuss any questions you have with your health care provider.     Document Released: 11/30/2011 Document Revised: 06/21/2014 Document Reviewed: 08/30/2013  Elsevier Interactive Patient Education ©2016 Elsevier Inc.

## 2015-04-11 NOTE — Assessment & Plan Note (Signed)
Under good control. Continue current regimen. Call with any concerns. Follow up 6 months.

## 2015-04-11 NOTE — Progress Notes (Signed)
BP 134/78 mmHg  Pulse 67  Temp(Src) 98.5 F (36.9 C)  Ht 5' 8.7" (1.745 m)  Wt 195 lb (88.451 kg)  BMI 29.05 kg/m2  SpO2 98%   Subjective:    Patient ID: Jacob Key, male    DOB: [birth year], 34 y.o.   MRN: [id number]  HPI: Jacob Key is a 34 y.o. male presenting on 04/11/2015 for comprehensive medical examination. Current medical complaints include:  DEPRESSION Mood status: controlled Satisfied with current treatment?: yes Symptom severity: mild  Duration of current treatment : 1 month Side effects: no Medication compliance: excellent compliance Psychotherapy/counseling: no  Depressed mood: no Anxious mood: no Anhedonia: no Significant weight loss or gain: no Insomnia: no  Fatigue: no Feelings of worthlessness or guilt: no Impaired concentration/indecisiveness: no Suicidal ideations: no Hopelessness: no Crying spells: yes Depression screen [facility] 2/9 04/11/2015 01/01/2014  Decreased Interest 0 0  Down, Depressed, Hopeless 1 0  PHQ - 2 Score 1 0  Altered sleeping 0 -  Tired, decreased energy 0 -  Change in appetite 1 -  Feeling bad or failure about yourself  1 -  Trouble concentrating 0 -  Moving slowly or fidgety/restless 0 -  Suicidal thoughts 0 -  PHQ-9 Score 3 -  Difficult doing work/chores Somewhat difficult -   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 Depression      Surgical History:  Past Surgical History  Procedure Laterality Date  . Tonsillectomy    . Facial reconstruction surgery      Lower face    Medications:  Current Outpatient Prescriptions on File Prior to Visit  Medication Sig  . PARoxetine (PAXIL) 20 MG tablet Take 1 tablet (20 mg total) by mouth daily.   No current facility-administered medications on file prior to visit.    Allergies:  No Known Allergies  Social History:  Social History   Social History  . Marital Status: Single    Spouse Name: N/A  . Number of Children: N/A  . Years of Education: N/A   Occupational History  . Not on file.   Social History Main Topics  . Smoking status: Former Smoker -- 0.75 packs/day for .5 years    Types: Cigarettes, E-cigarettes    Start date: 01/18/2015  . Smokeless tobacco: Never Used  . Alcohol Use: 12.0 oz/week    20 Cans of beer per week     Comment: moderate  . Drug Use: No  . Sexual Activity: Yes   Other Topics Concern  . Not on file   Social History Narrative   History  Smoking status  . Former Smoker -- 0.75 packs/day for .5 years  . Types: Cigarettes, E-cigarettes  . Start date: 01/18/2015  Smokeless tobacco  . Never Used   History  Alcohol Use  . 12.0 oz/week  . 20 Cans of  beer per week    Comment: moderate    Family History:  Family History  Problem Relation Age of Onset  . Hyperlipidemia Mother   . Hyperlipidemia Father   . Alcohol abuse Maternal Grandmother   . Heart disease Maternal Grandmother   . Heart disease Maternal Grandfather   . Alzheimer's disease Paternal Grandfather     Past medical history, surgical history, medications, allergies, family history and social history reviewed with patient today and changes made to appropriate areas of the chart.   Review of Systems  Constitutional: Negative.   HENT: Negative.   Eyes: Negative.   Respiratory: Negative.   Cardiovascular: Negative.   Gastrointestinal: Positive for  heartburn. Negative for nausea, vomiting, abdominal pain, diarrhea, constipation, blood in stool and melena.  Genitourinary: Negative.   Musculoskeletal: Negative.   Skin: Negative.   Neurological: Negative.   Endo/Heme/Allergies: Negative.   Psychiatric/Behavioral: Negative.    All other ROS negative except what is listed above and in the HPI.      Objective:    BP 134/78 mmHg  Pulse 67  Temp(Src) 98.5 F (36.9 C)  Ht 5' 8.7" (1.745 m)  Wt 195 lb (88.451 kg)  BMI 29.05 kg/m2  SpO2 98%  Wt Readings from Last 3 Encounters:  04/11/15 195 lb (88.451 kg)  03/20/15 198 lb 3.2 oz (89.903 kg)  01/01/14 185 lb (83.915 kg)    Physical Exam  Constitutional: He is oriented to person, place, and time. He appears well-developed and well-nourished. No distress.  HENT:  Head: Normocephalic and atraumatic.  Right Ear: Hearing and external ear normal.  Left Ear: Hearing and external ear normal.  Nose: Nose normal.  Mouth/Throat: Oropharynx is clear and moist. No oropharyngeal exudate.  Eyes: Conjunctivae, EOM and lids are normal. Pupils are equal, round, and reactive to light. Right eye exhibits no discharge. Left eye exhibits no discharge. No scleral icterus.  Neck: Normal range of motion. Neck supple. No JVD present. No tracheal deviation present. No thyromegaly present.  Cardiovascular: Normal rate, regular rhythm, normal heart sounds and intact distal pulses.  Exam reveals no gallop and no friction rub.   No murmur heard. Pulmonary/Chest: Effort normal and breath sounds normal. No stridor. No respiratory distress. He has no wheezes. He has no rales. He exhibits no tenderness.  Abdominal: Soft. Bowel sounds are normal. He exhibits no distension and no mass. There is no tenderness. There is no rebound and no guarding. Hernia confirmed negative in the right inguinal area and confirmed negative in the left inguinal area.  Genitourinary: Testes normal and penis normal. Circumcised. No penile  tenderness.  Musculoskeletal: Normal range of motion. He exhibits no edema or tenderness.  Lymphadenopathy:    He has no cervical adenopathy.       Right: No inguinal adenopathy present.       Left: No inguinal adenopathy present.  Neurological: He is alert and oriented to person, place, and time. He has normal reflexes. He displays normal reflexes. No cranial nerve deficit. He exhibits normal muscle tone. Coordination normal.  Skin: Skin is warm, dry and intact. No rash noted. He is not diaphoretic. No erythema. No pallor.  Psychiatric: He has a normal mood and affect. His speech is normal and behavior is normal. Judgment and thought content normal. Cognition and memory are normal.  Nursing note and vitals reviewed.   Results for orders placed or performed in visit on 03/26/15  Pathologist smear review  Result Value Ref Range   Path Rev  WBC  Normal    Path Rev RBC  Normal    Path Rev PLTs  Normal    PATHOLOGIST NAME Comment       Assessment & Plan:   Problem List Items Addressed This Visit      Other   Depression    Under good control. Continue current regimen. Call with any concerns. Follow up 6 months.        Other Visit Diagnoses    Routine general medical examination at a health care facility    -  Primary    Up to date on tdap. Refuses pneumo and flu. Screening labs done previously. Work on diet and exercise. Check in in 6 months.        LABORATORY TESTING:  Health maintenance labs ordered today as discussed above.   IMMUNIZATIONS:   - Tdap: Tetanus vaccination status reviewed: last tetanus booster within 10 years. - Influenza: Refused - Pneumovax: Refused  PATIENT COUNSELING:    Sexuality: Discussed sexually transmitted diseases, partner selection, use of condoms, avoidance of unintended pregnancy  and contraceptive alternatives.   Advised to avoid cigarette smoking.  I discussed with the patient that most people either abstain from alcohol or drink within  safe limits (<=14/week and <=4 drinks/occasion for males, <=7/weeks and <= 3 drinks/occasion for females) and that the risk for alcohol disorders and other health effects rises proportionally with the number of drinks per week and how often a drinker exceeds daily limits.  Discussed cessation/primary prevention of drug use and availability of treatment for abuse.   Diet: Encouraged to adjust caloric intake to maintain  or achieve ideal body weight, to reduce intake of dietary saturated fat and total fat, to limit sodium intake by avoiding high sodium foods and not adding table salt, and to maintain adequate dietary potassium and calcium preferably from fresh fruits, vegetables, and low-fat dairy products.    stressed the importance of regular exercise  Injury prevention: Discussed safety belts, safety helmets, smoke detector, smoking near bedding or upholstery.   Dental health: Discussed importance of regular tooth brushing, flossing, and dental visits.   Follow up plan: NEXT PREVENTATIVE PHYSICAL DUE IN 1 YEAR. Return in about 6 months (around 10/10/2015).

## 2015-06-05 ENCOUNTER — Telehealth: Payer: Self-pay

## 2015-06-05 MED ORDER — PAROXETINE HCL 20 MG PO TABS: 20.0000 mg | ORAL_TABLET | Freq: Every day | ORAL | Status: DC

## 2015-06-05 NOTE — Telephone Encounter (Signed)
Yes, done.  

## 2015-06-05 NOTE — Telephone Encounter (Signed)
[facility]: [address] [place] [id number]  [phone]  Patient is out of town for the holidays and he left his paroxetine 20mg  here, he would like to know if 5 days can be sent to the above pharmacy.

## 2015-06-25 ENCOUNTER — Telehealth: Payer: Self-pay | Admitting: Family Medicine

## 2015-06-25 MED ORDER — PAROXETINE HCL 20 MG PO TABS: 20.0000 mg | ORAL_TABLET | Freq: Every day | ORAL | Status: DC

## 2015-06-25 NOTE — Telephone Encounter (Signed)
Patient will just go ahead and get the same dose. He will call and schedule for another appointment if he thinks he needs it

## 2015-06-25 NOTE — Telephone Encounter (Signed)
Rx sent to his pharmacy

## 2015-06-25 NOTE — Telephone Encounter (Signed)
Needs appointment if he wants to go up. Will send refill through depending on what he wants to do.

## 2015-06-25 NOTE — Telephone Encounter (Signed)
Pt called stated he needs a refill on Paxil, pt stated he would like to increase dosage to 40mg  if possible. Pharm is [facility] on [address]. Thanks.

## 2015-06-25 NOTE — Telephone Encounter (Signed)
Forward to provider, Last seen in 04/11/15 next due in April.

## 2015-06-26 ENCOUNTER — Other Ambulatory Visit: Payer: Self-pay | Admitting: Family Medicine

## 2015-06-26 MED ORDER — PAROXETINE HCL 20 MG PO TABS: 20.0000 mg | ORAL_TABLET | Freq: Every day | ORAL | Status: DC

## 2015-06-26 NOTE — Telephone Encounter (Signed)
Rx refill error. Rx sent to his pharmacy.

## 2015-06-26 NOTE — Addendum Note (Signed)
Addended by: Dorcas CarrowJOHNSON, Meeghan Skipper P on: 06/26/2015 01:51 PM   Modules accepted: Orders, Medications

## 2015-10-10 ENCOUNTER — Ambulatory Visit: Payer: PRIVATE HEALTH INSURANCE | Admitting: Family Medicine

## 2015-10-13 ENCOUNTER — Ambulatory Visit: Payer: PRIVATE HEALTH INSURANCE | Admitting: Family Medicine

## 2015-10-16 ENCOUNTER — Ambulatory Visit (INDEPENDENT_AMBULATORY_CARE_PROVIDER_SITE_OTHER): Payer: PRIVATE HEALTH INSURANCE | Admitting: Family Medicine

## 2015-10-16 ENCOUNTER — Encounter: Payer: Self-pay | Admitting: Family Medicine

## 2015-10-16 VITALS — BP 131/80 | HR 76 | Temp 97.9°F | Ht 62.2 in | Wt 176.0 lb

## 2015-10-16 DIAGNOSIS — E785 Hyperlipidemia, unspecified: Secondary | ICD-10-CM | POA: Diagnosis not present

## 2015-10-16 DIAGNOSIS — F329 Major depressive disorder, single episode, unspecified: Secondary | ICD-10-CM | POA: Diagnosis not present

## 2015-10-16 DIAGNOSIS — F32A Depression, unspecified: Secondary | ICD-10-CM

## 2015-10-16 LAB — LIPID PANEL PICCOLO, WAIVED
CHOLESTEROL PICCOLO, WAIVED: 264 mg/dL — AB (ref <200)
Chol/HDL Ratio Piccolo,Waive: 4.3 mg/dL
HDL CHOL PICCOLO, WAIVED: 62 mg/dL (ref >59)
LDL Chol Calc Piccolo Waived: 150 mg/dL — ABNORMAL HIGH (ref <100)
Triglycerides Piccolo,Waived: 263 mg/dL — ABNORMAL HIGH (ref <150)
VLDL Chol Calc Piccolo,Waive: 53 mg/dL — ABNORMAL HIGH (ref <30)

## 2015-10-16 MED ORDER — PAROXETINE HCL 40 MG PO TABS: 40.0000 mg | ORAL_TABLET | Freq: Every day | ORAL | Status: DC

## 2015-10-16 MED ORDER — TRAZODONE HCL 50 MG PO TABS
25.0000 mg | ORAL_TABLET | Freq: Every evening | ORAL | Status: DC | PRN
Start: 2015-10-16 — End: 2015-11-20

## 2015-10-16 MED ORDER — PAROXETINE HCL 40 MG PO TABS: 20.0000 mg | ORAL_TABLET | Freq: Every day | ORAL | Status: DC

## 2015-10-16 NOTE — Patient Instructions (Signed)
High Cholesterol High cholesterol refers to having a high level of cholesterol in your blood. Cholesterol is a white, waxy, fat-like protein that your body needs in small amounts. Your liver makes all the cholesterol you need. Excess cholesterol comes from the food you eat. Cholesterol travels in your bloodstream through your blood vessels. If you have high cholesterol, deposits (plaque) may build up on the walls of your blood vessels. This makes the arteries narrower and stiffer. Plaque increases your risk of heart attack and stroke. Work with your health care provider to keep your cholesterol levels in a healthy range. RISK FACTORS Several things can make you more likely to have high cholesterol. These include:   Eating foods high in animal fat (saturated fat) or cholesterol.  Being overweight.  Not getting enough exercise.  Having a family history of high cholesterol. SIGNS AND SYMPTOMS High cholesterol does not cause symptoms. DIAGNOSIS  Your health care provider can do a blood test to check whether you have high cholesterol. If you are older than 20, your health care provider may check your cholesterol every 4-6 years. You may be checked more often if you already have high cholesterol or other risk factors for heart disease. The blood test for cholesterol measures the following:  Bad cholesterol (LDL cholesterol). This is the type of cholesterol that causes heart disease. This number should be less than 100.  Good cholesterol (HDL cholesterol). This type helps protect against heart disease. A healthy level of HDL cholesterol is 60 or higher.  Total cholesterol. This is the combined number of LDL cholesterol and HDL cholesterol. A healthy number is less than 200. TREATMENT  High cholesterol can be treated with diet changes, lifestyle changes, and medicine.   Diet changes may include eating more whole grains, fruits, vegetables, nuts, and fish. You may also have to cut back on red meat  and foods with a lot of added sugar.  Lifestyle changes may include getting at least 40 minutes of aerobic exercise three times a week. Aerobic exercises include walking, biking, and swimming. Aerobic exercise along with a healthy diet can help you maintain a healthy weight. Lifestyle changes may also include quitting smoking.  If diet and lifestyle changes are not enough to lower your cholesterol, your health care provider may prescribe a statin medicine. This medicine has been shown to lower cholesterol and also lower the risk of heart disease. HOME CARE INSTRUCTIONS  Only take over-the-counter or prescription medicines as directed by your health care provider.   Follow a healthy diet as directed by your health care provider. For instance:   Eat chicken (without skin), fish, veal, shellfish, ground turkey breast, and round or loin cuts of red meat.  Do not eat fried foods and fatty meats, such as hot dogs and salami.   Eat plenty of fruits, such as apples.   Eat plenty of vegetables, such as broccoli, potatoes, and carrots.   Eat beans, peas, and lentils.   Eat grains, such as barley, rice, couscous, and bulgur wheat.   Eat pasta without cream sauces.   Use skim or nonfat milk and low-fat or nonfat yogurt and cheeses. Do not eat or drink whole milk, cream, ice cream, egg yolks, and hard cheeses.   Do not eat stick margarine or tub margarines that contain trans fats (also called partially hydrogenated oils).   Do not eat cakes, cookies, crackers, or other baked goods that contain trans fats.   Do not eat saturated tropical oils, such as   coconut and palm oil.   Exercise as directed by your health care provider. Increase your activity level with activities such as gardening or walking.   Keep all follow-up appointments.  SEEK MEDICAL CARE IF:  You are struggling to maintain a healthy diet or weight.  You need help starting an exercise program.  You need help to  stop smoking. SEEK IMMEDIATE MEDICAL CARE IF:  You have chest pain.  You have trouble breathing.   This information is not intended to replace advice given to you by your health care provider. Make sure you discuss any questions you have with your health care provider.   Document Released: 05/31/2005 Document Revised: 06/21/2014 Document Reviewed: 03/23/2013 Elsevier Interactive Patient Education 2016 [employer] Food Choices to Lower Your Triglycerides Triglycerides are a type of fat in your blood. High levels of triglycerides can increase the risk of heart disease and stroke. If your triglyceride levels are high, the foods you eat and your eating habits are very important. Choosing the right foods can help lower your triglycerides.  WHAT GENERAL GUIDELINES DO I NEED TO FOLLOW?  Lose weight if you are overweight.   Limit or avoid alcohol.   Fill one half of your plate with vegetables and green salads.   Limit fruit to two servings a day. Choose fruit instead of juice.   Make one fourth of your plate whole grains. Look for the word "whole" as the first word in the ingredient list.  Fill one fourth of your plate with lean protein foods.  Enjoy fatty fish (such as salmon, mackerel, sardines, and tuna) three times a week.   Choose healthy fats.   Limit foods high in starch and sugar.  Eat more home-cooked food and less restaurant, buffet, and fast food.  Limit fried foods.  Cook foods using methods other than frying.  Limit saturated fats.  Check ingredient lists to avoid foods with partially hydrogenated oils (trans fats) in them. WHAT FOODS CAN I EAT?  Grains Whole grains, such as whole wheat or whole grain breads, crackers, cereals, and pasta. Unsweetened oatmeal, bulgur, barley, quinoa, or brown rice. Corn or whole wheat flour tortillas.  Vegetables Fresh or frozen vegetables (raw, steamed, roasted, or grilled). Green salads. Fruits All fresh, canned (in  natural juice), or frozen fruits. Meat and Other Protein Products Ground beef (85% or leaner), grass-fed beef, or beef trimmed of fat. Skinless chicken or [place]. Ground chicken or [place]. Pork trimmed of fat. All fish and seafood. Eggs. Dried beans, peas, or lentils. Unsalted nuts or seeds. Unsalted canned or dry beans. Dairy Low-fat dairy products, such as skim or 1% milk, 2% or reduced-fat cheeses, low-fat ricotta or cottage cheese, or plain low-fat yogurt. Fats and Oils Tub margarines without trans fats. Light or reduced-fat mayonnaise and salad dressings. Avocado. Safflower, olive, or canola oils. Natural peanut or almond butter. The items listed above may not be a complete list of recommended foods or beverages. Contact your dietitian for more options. WHAT FOODS ARE NOT RECOMMENDED?  Grains White bread. White pasta. White rice. Cornbread. Bagels, pastries, and croissants. Crackers that contain trans fat. Vegetables White potatoes. Corn. Creamed or fried vegetables. Vegetables in a cheese sauce. Fruits Dried fruits. Canned fruit in light or heavy syrup. Fruit juice. Meat and Other Protein Products Fatty cuts of meat. Ribs, chicken wings, bacon, sausage, bologna, salami, chitterlings, fatback, hot dogs, bratwurst, and packaged luncheon meats. Dairy Whole or 2% milk, cream, half-and-half, and cream cheese. Whole-fat or sweetened yogurt.  Full-fat cheeses. Nondairy creamers and whipped toppings. Processed cheese, cheese spreads, or cheese curds. Sweets and Desserts Corn syrup, sugars, honey, and molasses. Candy. Jam and jelly. Syrup. Sweetened cereals. Cookies, pies, cakes, donuts, muffins, and ice cream. Fats and Oils Butter, stick margarine, lard, shortening, ghee, or bacon fat. Coconut, palm kernel, or palm oils. Beverages Alcohol. Sweetened drinks (such as sodas, lemonade, and fruit drinks or punches). The items listed above may not be a complete list of foods and beverages to avoid.  Contact your dietitian for more information.   This information is not intended to replace advice given to you by your health care provider. Make sure you discuss any questions you have with your health care provider.   Document Released: 03/18/2004 Document Revised: 06/21/2014 Document Reviewed: 04/04/2013 Elsevier Interactive Patient Education [employer]

## 2015-10-16 NOTE — Assessment & Plan Note (Signed)
Worse than previously, but has not been watching very much. Will work on diet and exercise and recheck in about 3 months. Call with concerns.

## 2015-10-16 NOTE — Assessment & Plan Note (Signed)
Not under good control. Will increase his paxil and add trazodone for sleep, consider buspar or atarax if not improving. Call if not getting better. Recheck 1 month.

## 2015-10-16 NOTE — Progress Notes (Signed)
BP 131/80 mmHg  Pulse 76  Temp(Src) 97.9 F (36.6 C)  Ht 5' 2.2" (1.58 m)  Wt 176 lb (79.833 kg)  BMI 31.98 kg/m2  SpO2 98%   Subjective:    Patient ID: Jacob Key, Jacob    DOB: [birth year], 35 y.o.   MRN: [id number]  HPI: Jacob Key is a 35 y.o. Jacob  Chief Complaint  Patient presents with  . Depression  . Anxiety  . 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 Mood status: exacerbated Satisfied with current treatment?: no Symptom severity: moderate  Duration of current treatment : chronic Side effects: no Medication compliance: excellent compliance Psychotherapy/counseling: no to talk to hospice about grief counseling and would like to see someone long term Depressed mood: yes Anxious mood: yes Anhedonia: no Significant weight loss or gain: yes- with dietary changes Insomnia: yes hard to stay asleep Fatigue: yes Feelings of worthlessness or guilt: yes Impaired concentration/indecisiveness: yes Suicidal ideations: no Hopelessness: no Crying spells: no Depression screen [facility] 2/9 10/16/2015 04/11/2015 01/01/2014  Decreased Interest 0 0 0  Down, Depressed, Hopeless 2 1 0  PHQ - 2 Score 2 1 0  Altered sleeping 1 0 -  Tired, decreased energy 0 0 -  Change in appetite 1 1 -  Feeling bad or failure about yourself  2 1 -  Trouble concentrating 0 0 -  Moving slowly or fidgety/restless 1 0 -  Suicidal thoughts 0 0 -  PHQ-9 Score 7 3 -  Difficult doing work/chores - Somewhat difficult -   HYPERLIPIDEMIA Hyperlipidemia status: Worse than previously- has been trying to watch diet a little bit, but ate [employer] today Satisfied with current treatment?  no Past cholesterol meds: none Supplements: none Aspirin:  no Chest pain:  no Coronary artery disease:  no Family history CAD:  yes Family history early CAD:  no  Relevant past medical, surgical, family and social history reviewed and updated as indicated. Interim  medical history since our last visit reviewed. Allergies and medications reviewed and updated.  Review of Systems  Constitutional: Negative.   Respiratory: Negative.   Cardiovascular: Negative.   Psychiatric/Behavioral: Positive for sleep disturbance and dysphoric mood. Negative for suicidal ideas, hallucinations, behavioral problems, confusion, self-injury, decreased concentration and agitation. The patient is nervous/anxious. The patient is not hyperactive.     Per HPI unless specifically indicated above     Objective:    BP 131/80 mmHg  Pulse 76  Temp(Src) 97.9 F (36.6 C)  Ht 5' 2.2" (1.58 m)  Wt 176 lb (79.833 kg)  BMI 31.98 kg/m2  SpO2 98%  Wt Readings from Last 3 Encounters:  10/16/15 176 lb (79.833 kg)  04/11/15 195 lb (88.451 kg)  03/20/15 198 lb 3.2 oz (89.903 kg)    Physical Exam  Constitutional: He is oriented to person, place, and time. He appears well-developed and well-nourished. No distress.  HENT:  Head: Normocephalic and atraumatic.  Right Ear: Hearing normal.  Left Ear: Hearing normal.  Nose: Nose normal.  Eyes: Conjunctivae and lids are normal. Right eye exhibits no discharge. Left eye exhibits no discharge. No scleral icterus.  Cardiovascular: Normal rate, regular rhythm, normal heart sounds and intact distal pulses.  Exam reveals no gallop and no friction rub.   No murmur heard. Pulmonary/Chest: Effort normal and breath sounds normal. No respiratory distress. He has no wheezes. He has no rales. He exhibits no tenderness.  Musculoskeletal: Normal range of motion.  Neurological:  He is alert and oriented to person, place, and time.  Skin: Skin is warm, dry and intact. No rash noted. No erythema. No pallor.  Psychiatric: He has a normal mood and affect. His speech is normal and behavior is normal. Judgment and thought content normal. Cognition and memory are normal.  Nursing note and vitals reviewed.   Results for orders placed or performed in visit on  03/26/15  Pathologist smear review  Result Value Ref Range   Path Rev WBC  Normal    Path Rev RBC  Normal    Path Rev PLTs  Normal    PATHOLOGIST NAME Comment       Assessment & Plan:   Problem List Items Addressed This Visit      Other   Depression - Primary    Not under good control. Will increase his paxil and add trazodone for sleep, consider buspar or atarax if not improving. Call if not getting better. Recheck 1 month.       Relevant Medications   PARoxetine (PAXIL) 40 MG tablet   traZODone (DESYREL) 50 MG tablet   Other Relevant Orders   Comprehensive metabolic panel   TSH   Hyperlipidemia    Worse than previously, but has not been watching very much. Will work on diet and exercise and recheck in about 3 months. Call with concerns.       Relevant Orders   Comprehensive metabolic panel   Lipid Panel Piccolo, Waived       Follow up plan: Return in about 4 weeks (around 11/13/2015) for Follow up.

## 2015-10-17 ENCOUNTER — Encounter: Payer: Self-pay | Admitting: Family Medicine

## 2015-10-17 LAB — COMPREHENSIVE METABOLIC PANEL
ALT: 19 IU/L (ref 0–44)
AST: 22 IU/L (ref 0–40)
Albumin/Globulin Ratio: 2.3 — ABNORMAL HIGH (ref 1.2–2.2)
Albumin: 5.1 g/dL (ref 3.5–5.5)
Alkaline Phosphatase: 54 IU/L (ref 39–117)
BUN/Creatinine Ratio: 13 (ref 9–20)
BUN: 11 mg/dL (ref 6–20)
Bilirubin Total: 0.5 mg/dL (ref 0.0–1.2)
CALCIUM: 9.9 mg/dL (ref 8.7–10.2)
CHLORIDE: 98 mmol/L (ref 96–106)
CO2: 26 mmol/L (ref 18–29)
Creatinine, Ser: 0.88 mg/dL (ref 0.76–1.27)
GFR, EST AFRICAN AMERICAN: 130 mL/min/{1.73_m2} (ref >59)
GFR, EST NON AFRICAN AMERICAN: 112 mL/min/{1.73_m2} (ref >59)
GLUCOSE: 83 mg/dL (ref 65–99)
Globulin, Total: 2.2 g/dL (ref 1.5–4.5)
Potassium: 4.5 mmol/L (ref 3.5–5.2)
Sodium: 142 mmol/L (ref 134–144)
TOTAL PROTEIN: 7.3 g/dL (ref 6.0–8.5)

## 2015-10-17 LAB — TSH: TSH: 1.07 u[IU]/mL (ref 0.450–4.500)

## 2015-11-20 ENCOUNTER — Encounter: Payer: Self-pay | Admitting: Family Medicine

## 2015-11-20 ENCOUNTER — Ambulatory Visit (INDEPENDENT_AMBULATORY_CARE_PROVIDER_SITE_OTHER): Payer: PRIVATE HEALTH INSURANCE | Admitting: Family Medicine

## 2015-11-20 VITALS — BP 137/79 | HR 61 | Temp 97.9°F | Wt 177.0 lb

## 2015-11-20 DIAGNOSIS — F32A Depression, unspecified: Secondary | ICD-10-CM

## 2015-11-20 DIAGNOSIS — F329 Major depressive disorder, single episode, unspecified: Secondary | ICD-10-CM | POA: Diagnosis not present

## 2015-11-20 MED ORDER — TRAZODONE HCL 50 MG PO TABS: 25.0000 mg | ORAL_TABLET | Freq: Every evening | ORAL | Status: DC | PRN

## 2015-11-20 MED ORDER — PAROXETINE HCL 40 MG PO TABS: 40.0000 mg | ORAL_TABLET | Freq: Every day | ORAL | Status: DC

## 2015-11-20 NOTE — Progress Notes (Signed)
BP 137/79 mmHg  Pulse 61  Temp(Src) 97.9 F (36.6 C)  Wt 177 lb (80.287 kg)  SpO2 100%   Subjective:    Patient ID: Jacob Key, male    DOB: 01/04/1981, 35 y.o.   MRN: [id number]  HPI: Jacob Key is a 35 y.o. male  Chief Complaint  Patient presents with  . Depression   DEPRESSION Mood status: better Satisfied with current treatment?: yes Symptom severity: mild  Duration of current treatment : chronic Side effects: no Medication compliance: excellent compliance Psychotherapy/counseling: no in the past Depressed mood: yes Anxious mood: yes Anhedonia: no Significant weight loss or gain: no Insomnia: no  Fatigue: no Feelings of worthlessness or guilt: no Impaired concentration/indecisiveness: no Suicidal ideations: no Hopelessness: no Crying spells: no [facility] 11/20/2015 10/16/2015 04/11/2015 01/01/2014  Decreased Interest 0 0 0 0  Down, Depressed, Hopeless 1 2 1  0  PHQ - 2 Score 1 2 1  0  Altered sleeping 1 1 0 -  Tired, decreased energy 0 0 0 -  Change in appetite 1 1 1  -  Feeling bad or failure about yourself  2 2 1  -  Trouble concentrating 1 0 0 -  Moving slowly or fidgety/restless 1 1 0 -  Suicidal thoughts 0 0 0 -  PHQ-9 Score 7 7 3  -  Difficult doing work/chores - - Somewhat difficult -   Relevant past medical, surgical, family and social history reviewed and updated as indicated. Interim medical history since our last visit reviewed. Allergies and medications reviewed and updated.  Review of Systems  Constitutional: Negative.   Respiratory: Negative.   Cardiovascular: Negative.   Psychiatric/Behavioral: Negative.    Per HPI unless specifically indicated above     Objective:    BP 137/79 mmHg  Pulse 61  Temp(Src) 97.9 F (36.6 C)  Wt 177 lb (80.287 kg)  SpO2 100%  Wt Readings from Last 3 Encounters:  11/20/15 177 lb (80.287 kg)  10/16/15 176 lb (79.833 kg)  04/11/15 195 lb (88.451 kg)    Physical Exam  Constitutional:  He is oriented to person, place, and time. He appears well-developed and well-nourished. No distress.  HENT:  Head: Normocephalic and atraumatic.  Right Ear: Hearing normal.  Left Ear: Hearing normal.  Nose: Nose normal.  Eyes: Conjunctivae and lids are normal. Right eye exhibits no discharge. Left eye exhibits no discharge. No scleral icterus.  Cardiovascular: Normal rate, regular rhythm, normal heart sounds and intact distal pulses.  Exam reveals no gallop and no friction rub.   No murmur heard. Pulmonary/Chest: Effort normal and breath sounds normal. No respiratory distress. He has no wheezes. He has no rales. He exhibits no tenderness.  Musculoskeletal: Normal range of motion.  Neurological: He is alert and oriented to person, place, and time.  Skin: Skin is intact. No rash noted.  Psychiatric: He has a normal mood and affect. His speech is normal and behavior is normal. Judgment and thought content normal. Cognition and memory are normal.  Nursing note and vitals reviewed.   Results for orders placed or performed in visit on 10/16/15  Comprehensive metabolic panel  Result Value Ref Range   Glucose 83 65 - 99 mg/dL   BUN 11 6 - 20 mg/dL   Creatinine, Ser 3.080.88 0.76 - 1.27 mg/dL   GFR calc non Af Amer 112 >59 mL/min/1.73   GFR calc Af Amer 130 >59 mL/min/1.73   BUN/Creatinine Ratio 13 9 - 20   Sodium 142 134 - 144  mmol/L   Potassium 4.5 3.5 - 5.2 mmol/L   Chloride 98 96 - 106 mmol/L   CO2 26 18 - 29 mmol/L   Calcium 9.9 8.7 - 10.2 mg/dL   Total Protein 7.3 6.0 - 8.5 g/dL   Albumin 5.1 3.5 - 5.5 g/dL   Globulin, Total 2.2 1.5 - 4.5 g/dL   Albumin/Globulin Ratio 2.3 (H) 1.2 - 2.2   Bilirubin Total 0.5 0.0 - 1.2 mg/dL   Alkaline Phosphatase 54 39 - 117 IU/L   AST 22 0 - 40 IU/L   ALT 19 0 - 44 IU/L  Lipid Panel Piccolo, Waived  Result Value Ref Range   Cholesterol Piccolo, Waived 264 (H) <200 mg/dL   HDL Chol Piccolo, Waived 62 >59 mg/dL   Triglycerides Piccolo,Waived 263  (H) <150 mg/dL   Chol/HDL Ratio Piccolo,Waive 4.3 mg/dL   LDL Chol Calc Piccolo Waived 150 (H) <100 mg/dL   VLDL Chol Calc Piccolo,Waive 53 (H) <30 mg/dL  TSH  Result Value Ref Range   TSH 1.070 0.450 - 4.500 uIU/mL      Assessment & Plan:   Problem List Items Addressed This Visit      Other   Depression - Primary    Doing much better. Doesn't want to change dose on medicine. Continue current regimen. Continue to monitor. Recheck 2 months.       Relevant Medications   traZODone (DESYREL) 50 MG tablet   PARoxetine (PAXIL) 40 MG tablet       Follow up plan: Return in about 2 months (around 01/20/2016) for cholesterol follow up.

## 2015-11-20 NOTE — Assessment & Plan Note (Signed)
Doing much better. Doesn't want to change dose on medicine. Continue current regimen. Continue to monitor. Recheck 2 months.

## 2016-01-27 ENCOUNTER — Ambulatory Visit: Payer: PRIVATE HEALTH INSURANCE | Admitting: Family Medicine

## 2016-01-29 ENCOUNTER — Ambulatory Visit: Payer: PRIVATE HEALTH INSURANCE | Admitting: Family Medicine

## 2016-01-29 NOTE — Progress Notes (Deleted)
   There were no vitals taken for this visit.   Subjective:    Patient ID: Jacob Key, male    DOB: 08/31/1980, 35 y.o.   MRN: [id number]  HPI: Jacob Ladendrew Wiens is a 35 y.o. male  No chief complaint on file.  HYPERLIPIDEMIA Hyperlipidemia status: {Blank single:19197::"excellent compliance","good compliance","fair compliance","poor compliance"} Past cholesterol meds: none Supplements: {Blank multiple:19196::"none","fish oil","niacin","red yeast rice"} Aspirin:  {Blank single:19197::"yes","no"} Chest pain:  {Blank single:19197::"yes","no"} Coronary artery disease:  {Blank single:19197::"yes","no"} Family history CAD:  {Blank single:19197::"yes","no"} Family history early CAD:  {Blank single:19197::"yes","no"}  Relevant past medical, surgical, family and social history reviewed and updated as indicated. Interim medical history since our last visit reviewed. Allergies and medications reviewed and updated.  Review of Systems  Per HPI unless specifically indicated above     Objective:    There were no vitals taken for this visit.  Wt Readings from Last 3 Encounters:  11/20/15 177 lb (80.3 kg)  10/16/15 176 lb (79.8 kg)  04/11/15 195 lb (88.5 kg)    Physical Exam  Results for orders placed or performed in visit on 10/16/15  Comprehensive metabolic panel  Result Value Ref Range   Glucose 83 65 - 99 mg/dL   BUN 11 6 - 20 mg/dL   Creatinine, Ser 4.090.88 0.76 - 1.27 mg/dL   GFR calc non Af Amer 112 >59 mL/min/1.73   GFR calc Af Amer 130 >59 mL/min/1.73   BUN/Creatinine Ratio 13 9 - 20   Sodium 142 134 - 144 mmol/L   Potassium 4.5 3.5 - 5.2 mmol/L   Chloride 98 96 - 106 mmol/L   CO2 26 18 - 29 mmol/L   Calcium 9.9 8.7 - 10.2 mg/dL   Total Protein 7.3 6.0 - 8.5 g/dL   Albumin 5.1 3.5 - 5.5 g/dL   Globulin, Total 2.2 1.5 - 4.5 g/dL   Albumin/Globulin Ratio 2.3 (H) 1.2 - 2.2   Bilirubin Total 0.5 0.0 - 1.2 mg/dL   Alkaline Phosphatase 54 39 - 117 IU/L   AST 22 0 - 40 IU/L     ALT 19 0 - 44 IU/L  Lipid Panel Piccolo, Waived  Result Value Ref Range   Cholesterol Piccolo, Waived 264 (H) <200 mg/dL   HDL Chol Piccolo, Waived 62 >59 mg/dL   Triglycerides Piccolo,Waived 263 (H) <150 mg/dL   Chol/HDL Ratio Piccolo,Waive 4.3 mg/dL   LDL Chol Calc Piccolo Waived 150 (H) <100 mg/dL   VLDL Chol Calc Piccolo,Waive 53 (H) <30 mg/dL  TSH  Result Value Ref Range   TSH 1.070 0.450 - 4.500 uIU/mL      Assessment & Plan:   Problem List Items Addressed This Visit      Other   Hyperlipidemia - Primary    Other Visit Diagnoses   None.      Follow up plan: No Follow-up on file.

## 2016-02-02 ENCOUNTER — Ambulatory Visit: Payer: PRIVATE HEALTH INSURANCE | Admitting: Family Medicine

## 2016-02-06 ENCOUNTER — Ambulatory Visit (INDEPENDENT_AMBULATORY_CARE_PROVIDER_SITE_OTHER): Payer: PRIVATE HEALTH INSURANCE | Admitting: Family Medicine

## 2016-02-06 ENCOUNTER — Encounter: Payer: Self-pay | Admitting: Family Medicine

## 2016-02-06 VITALS — BP 138/84 | HR 62 | Temp 97.5°F | Wt 180.0 lb

## 2016-02-06 DIAGNOSIS — E785 Hyperlipidemia, unspecified: Secondary | ICD-10-CM

## 2016-02-06 LAB — LP+ALT+AST PICCOLO, WAIVED
ALT (SGPT) PICCOLO, WAIVED: 23 U/L (ref 10–47)
AST (SGOT) PICCOLO, WAIVED: 33 U/L (ref 11–38)
CHOLESTEROL PICCOLO, WAIVED: 220 mg/dL — AB (ref <200)
Chol/HDL Ratio Piccolo,Waive: 4.3 mg/dL
HDL CHOL PICCOLO, WAIVED: 52 mg/dL — AB (ref >59)
LDL Chol Calc Piccolo Waived: 120 mg/dL — ABNORMAL HIGH (ref <100)
Triglycerides Piccolo,Waived: 241 mg/dL — ABNORMAL HIGH (ref <150)
VLDL Chol Calc Piccolo,Waive: 48 mg/dL — ABNORMAL HIGH (ref <30)

## 2016-02-06 NOTE — Assessment & Plan Note (Signed)
Doing much better! Down to 220. Continue to work on diet and exercise. Recheck at physical in  6months.

## 2016-02-06 NOTE — Progress Notes (Signed)
BP 138/84 (BP Location: Left Arm, Patient Position: Sitting, Cuff Size: Normal)   Pulse 62   Temp 97.5 F (36.4 C)   Wt 180 lb (81.6 kg)   BMI 32.71 kg/m    Subjective:    Patient ID: Jacob Key, male    DOB: 05/03/1981, 35 y.o.   MRN: [id number]  HPI: Jacob Key is a 35 y.o. male  Chief Complaint  Patient presents with  . Hyperlipidemia   HYPERLIPIDEMIA Hyperlipidemia status: Doing better on diet Satisfied with current treatment?  yes Past cholesterol meds: none Supplements: none Aspirin:  no Chest pain:  no Coronary artery disease:  no Family history CAD:  yes  Needs a form filled out for insurance stating that he has been treated for mental illness and has a history of suicide attempt. Will fill out for him.  Relevant past medical, surgical, family and social history reviewed and updated as indicated. Interim medical history since our last visit reviewed. Allergies and medications reviewed and updated.  Review of Systems  Constitutional: Negative.   Respiratory: Negative.   Cardiovascular: Negative.   Psychiatric/Behavioral: Negative.     Per HPI unless specifically indicated above     Objective:    BP 138/84 (BP Location: Left Arm, Patient Position: Sitting, Cuff Size: Normal)   Pulse 62   Temp 97.5 F (36.4 C)   Wt 180 lb (81.6 kg)   BMI 32.71 kg/m   Wt Readings from Last 3 Encounters:  02/06/16 180 lb (81.6 kg)  11/20/15 177 lb (80.3 kg)  10/16/15 176 lb (79.8 kg)    Physical Exam  Constitutional: He is oriented to person, place, and time. He appears well-developed and well-nourished. No distress.  HENT:  Head: Normocephalic and atraumatic.  Right Ear: Hearing normal.  Left Ear: Hearing normal.  Nose: Nose normal.  Eyes: Conjunctivae and lids are normal. Right eye exhibits no discharge. Left eye exhibits no discharge. No scleral icterus.  Cardiovascular: Normal rate, regular rhythm, normal heart sounds and intact distal pulses.  Exam  reveals no gallop and no friction rub.   No murmur heard. Pulmonary/Chest: Effort normal and breath sounds normal. No respiratory distress. He has no wheezes. He has no rales. He exhibits no tenderness.  Musculoskeletal: Normal range of motion.  Neurological: He is alert and oriented to person, place, and time.  Skin: Skin is warm, dry and intact. No rash noted. No erythema. No pallor.  Psychiatric: He has a normal mood and affect. His speech is normal and behavior is normal. Judgment and thought content normal. Cognition and memory are normal.  Nursing note and vitals reviewed.   Results for orders placed or performed in visit on 10/16/15  Comprehensive metabolic panel  Result Value Ref Range   Glucose 83 65 - 99 mg/dL   BUN 11 6 - 20 mg/dL   Creatinine, Ser 6.96 0.76 - 1.27 mg/dL   GFR calc non Af Amer 112 >59 mL/min/1.73   GFR calc Af Amer 130 >59 mL/min/1.73   BUN/Creatinine Ratio 13 9 - 20   Sodium 142 134 - 144 mmol/L   Potassium 4.5 3.5 - 5.2 mmol/L   Chloride 98 96 - 106 mmol/L   CO2 26 18 - 29 mmol/L   Calcium 9.9 8.7 - 10.2 mg/dL   Total Protein 7.3 6.0 - 8.5 g/dL   Albumin 5.1 3.5 - 5.5 g/dL   Globulin, Total 2.2 1.5 - 4.5 g/dL   Albumin/Globulin Ratio 2.3 (H) 1.2 - 2.2  Bilirubin Total 0.5 0.0 - 1.2 mg/dL   Alkaline Phosphatase 54 39 - 117 IU/L   AST 22 0 - 40 IU/L   ALT 19 0 - 44 IU/L  Lipid Panel Piccolo, Waived  Result Value Ref Range   Cholesterol Piccolo, Waived 264 (H) <200 mg/dL   HDL Chol Piccolo, Waived 62 >59 mg/dL   Triglycerides Piccolo,Waived 263 (H) <150 mg/dL   Chol/HDL Ratio Piccolo,Waive 4.3 mg/dL   LDL Chol Calc Piccolo Waived 150 (H) <100 mg/dL   VLDL Chol Calc Piccolo,Waive 53 (H) <30 mg/dL  TSH  Result Value Ref Range   TSH 1.070 0.450 - 4.500 uIU/mL      Assessment & Plan:   Problem List Items Addressed This Visit      Other   Hyperlipidemia - Primary    Doing much better! Down to 220. Continue to work on diet and exercise. Recheck  at physical in  6months.       Relevant Orders   LP+ALT+AST [place]colo, [place]    Other Visit Diagnoses   None.      Follow up plan: Return in about 6 months (around 08/08/2016) for Physical.

## 2016-08-09 ENCOUNTER — Ambulatory Visit: Payer: PRIVATE HEALTH INSURANCE | Admitting: Family Medicine

## 2016-08-11 ENCOUNTER — Encounter: Payer: PRIVATE HEALTH INSURANCE | Admitting: Family Medicine

## 2016-11-22 ENCOUNTER — Telehealth: Payer: Self-pay | Admitting: Family Medicine

## 2016-11-22 ENCOUNTER — Other Ambulatory Visit: Payer: Self-pay | Admitting: Family Medicine

## 2016-11-22 MED ORDER — PAROXETINE HCL 40 MG PO TABS: 40.0000 mg | ORAL_TABLET | Freq: Every day | ORAL | 6 refills | Status: DC

## 2016-11-22 NOTE — Telephone Encounter (Signed)
Rx sent to his pharmacy. Please have him make an appointment in the next 6 months.

## 2016-11-22 NOTE — Telephone Encounter (Signed)
Pt called and stated he had lost his insurance and would like to know if he could have a refill for his PARoxetine (PAXIL) 40 MG tablet sent to [facility]. Pt scheduled appt for 02/24/17.

## 2017-02-24 ENCOUNTER — Ambulatory Visit: Payer: PRIVATE HEALTH INSURANCE | Admitting: Family Medicine

## 2018-01-19 ENCOUNTER — Telehealth: Payer: Self-pay | Admitting: Family Medicine

## 2018-01-19 ENCOUNTER — Other Ambulatory Visit: Payer: Self-pay | Admitting: Family Medicine

## 2018-01-19 NOTE — Telephone Encounter (Signed)
Copied from CRM [id number]. Topic: General - Other >> Jan 19, 2018 12:59 PM Dawoud, Joyce CopaJessica L wrote: Reason for CRM: pt called and stated that he does not have insurance but needs a medication refill and would like to know a round about cost it would be. Pt is having financial issues. Please advise. CB#[phone] >> Jan 19, 2018  1:40 PM Sharol GivenWilliamson, Christan M wrote: Is there a way to know about what level this pt will be? Pt last seen 02/06/16.

## 2018-01-19 NOTE — Telephone Encounter (Signed)
appt made. Pt notified that he would be able to make a $50 payment and be billed for the remaining balance.

## 2018-01-19 NOTE — Telephone Encounter (Signed)
Would likely be a [id number], but I wouldn't know without seeing him, shouldn't be more than that.

## 2018-01-21 ENCOUNTER — Other Ambulatory Visit: Payer: Self-pay | Admitting: Family Medicine

## 2018-01-27 ENCOUNTER — Ambulatory Visit: Payer: Self-pay | Admitting: Family Medicine

## 2018-01-27 ENCOUNTER — Telehealth: Payer: Self-pay

## 2018-01-27 ENCOUNTER — Encounter: Payer: Self-pay | Admitting: Family Medicine

## 2018-01-27 ENCOUNTER — Other Ambulatory Visit: Payer: Self-pay

## 2018-01-27 VITALS — BP 145/78 | HR 61 | Temp 98.2°F | Ht 69.0 in | Wt 170.3 lb

## 2018-01-27 DIAGNOSIS — Z599 Problem related to housing and economic circumstances, unspecified: Secondary | ICD-10-CM

## 2018-01-27 DIAGNOSIS — Z598 Other problems related to housing and economic circumstances: Secondary | ICD-10-CM

## 2018-01-27 DIAGNOSIS — E782 Mixed hyperlipidemia: Secondary | ICD-10-CM

## 2018-01-27 DIAGNOSIS — F331 Major depressive disorder, recurrent, moderate: Secondary | ICD-10-CM

## 2018-01-27 DIAGNOSIS — F429 Obsessive-compulsive disorder, unspecified: Secondary | ICD-10-CM

## 2018-01-27 MED ORDER — PAROXETINE HCL 10 MG PO TABS: 10.0000 mg | ORAL_TABLET | Freq: Every day | ORAL | 6 refills | Status: DC

## 2018-01-27 NOTE — Telephone Encounter (Signed)
Patient requesting information on insurance, will place c3 referral to see if he qualifies for medicaid or if there are any other resources available .

## 2018-01-27 NOTE — Progress Notes (Signed)
BP (!) 145/78   Pulse 61   Temp 98.2 F (36.8 C) (Oral)   Ht 5\' 9"  (1.753 m)   Wt 170 lb 4.8 oz (77.2 kg)   SpO2 99%   BMI 25.15 kg/m    Subjective:    Patient ID: Jacob Key, male    DOB: 07/26/1980, 37 y.o.   MRN: [id number]  HPI: Jacob Ladendrew Seidman is a 37 y.o. male  Chief Complaint  Patient presents with  . Medication Refill    paxil  . Depression    pt would like to discuss mental health per pt  . Flu Vaccine    pt refused   States his OCD is bothering him more than normal, and also having some paranoia from his obsessive thoughts. Has been under decent control with 40 mg paxil for over a year. Leery about trying new psychiatric medications. Took zoloft as a teenager and felt like a zombie. Has not tried any counseling. Lost insurance a while back and states he can't afford it as well as most medications. Denies SI/HI, hallucinations, severe mood swings.   [facility] 01/27/2018 11/20/2015 10/16/2015  Decreased Interest 0 0 0  Down, Depressed, Hopeless 0 1 2  PHQ - 2 Score 0 1 2  Altered sleeping 0 1 1  Tired, decreased energy 0 0 0  Change in appetite 0 1 1  Feeling bad or failure about yourself  0 2 2  Trouble concentrating 0 1 0  Moving slowly or fidgety/restless 1 1 1   Suicidal thoughts 0 0 0  PHQ-9 Score 1 7 7   Difficult doing work/chores - - -    Relevant past medical, surgical, family and social history reviewed and updated as indicated. Interim medical history since our last visit reviewed. Allergies and medications reviewed and updated.  Review of Systems  Per HPI unless specifically indicated above     Objective:    BP (!) 145/78   Pulse 61   Temp 98.2 F (36.8 C) (Oral)   Ht 5\' 9"  (1.753 m)   Wt 170 lb 4.8 oz (77.2 kg)   SpO2 99%   BMI 25.15 kg/m   Wt Readings from Last 3 Encounters:  01/27/18 170 lb 4.8 oz (77.2 kg)  02/06/16 180 lb (81.6 kg)  11/20/15 177 lb (80.3 kg)    Physical Exam  Constitutional: He appears  well-developed and well-nourished. No distress.  HENT:  Head: Atraumatic.  Eyes: Conjunctivae and EOM are normal.  Neck: Normal range of motion. Neck supple.  Cardiovascular: Normal rate and regular rhythm.  Pulmonary/Chest: Effort normal and breath sounds normal.  Musculoskeletal: Normal range of motion.  Neurological: He is alert.  Skin: Skin is warm and dry.  Psychiatric: He has a normal mood and affect. His behavior is normal.  Nursing note and vitals reviewed.   Results for orders placed or performed in visit on 01/27/18  Lipid Panel w/o Chol/HDL Ratio  Result Value Ref Range   Cholesterol, Total 226 (H) 100 - 199 mg/dL   Triglycerides 409232 (H) 0 - 149 mg/dL   HDL 52 >81>39 mg/dL   VLDL Cholesterol Cal 46 (H) 5 - 40 mg/dL   LDL Calculated 191128 (H) 0 - 99 mg/dL      Assessment & Plan:   Problem List Items Addressed This Visit      Other   Depression - Primary    Several suggestions on medications to try, pt wanting to just increase to max dose paxil  at 50 mg. Scared of sedation risks with trying a new medication as well as cost. Pt will work on obtaining some sort of insurance in the near future to open up options for his care      Relevant Medications   PARoxetine (PAXIL) 10 MG tablet   Hyperlipidemia    Diet controlled, wanting to check levels today while he's here. Await lipid results. Adjust as needed      Relevant Orders   Lipid Panel w/o Chol/HDL Ratio (Completed)    Other Visit Diagnoses    Obsessive-compulsive disorder, unspecified type       Increase paxil, monitor closely. Scared to start new medications due to sedation risks and cost per patient.    Relevant Medications   PARoxetine (PAXIL) 10 MG tablet       Follow up plan: Return in about 4 weeks (around 02/24/2018) for Mood f/u.

## 2018-01-28 LAB — LIPID PANEL W/O CHOL/HDL RATIO
CHOLESTEROL TOTAL: 226 mg/dL — AB (ref 100–199)
HDL: 52 mg/dL (ref >39)
LDL Calculated: 128 mg/dL — ABNORMAL HIGH (ref 0–99)
TRIGLYCERIDES: 232 mg/dL — AB (ref 0–149)
VLDL Cholesterol Cal: 46 mg/dL — ABNORMAL HIGH (ref 5–40)

## 2018-01-28 NOTE — Telephone Encounter (Signed)
Referral received will reach out to Mr. Jacob Key this coming week. Knb

## 2018-01-30 NOTE — Assessment & Plan Note (Signed)
Several suggestions on medications to try, pt wanting to just increase to max dose paxil at 50 mg. Scared of sedation risks with trying a new medication as well as cost. Pt will work on obtaining some sort of insurance in the near future to open up options for his care

## 2018-01-30 NOTE — Patient Instructions (Signed)
Follow up in 1 month   

## 2018-01-30 NOTE — Assessment & Plan Note (Signed)
Diet controlled, wanting to check levels today while he's here. Await lipid results. Adjust as needed

## 2018-02-07 ENCOUNTER — Telehealth: Payer: Self-pay | Admitting: Family Medicine

## 2018-02-07 MED ORDER — PAROXETINE HCL 10 MG PO TABS: 10.0000 mg | ORAL_TABLET | Freq: Every day | ORAL | 6 refills | Status: DC

## 2018-02-07 NOTE — Telephone Encounter (Signed)
Spoke with pt.  [facility] [place]  Pt. was advised that nurse will process his request.  [facility]  [facility], per pt. request.

## 2018-02-07 NOTE — Telephone Encounter (Signed)
Copied from CRM [id number]. Topic: Quick Communication - See Telephone Encounter >> Feb 07, 2018  2:22 PM Tamela OddiMartin, Don'Quashia, NT wrote: CRM for notification. See Telephone encounter for: 02/07/18. Patient called and states his PARoxetine (PAXIL) 10 MG [facility]. [facility] [address] - [place], [place] - [id number] [phone] (Phone) [phone] (Fax)

## 2018-05-15 ENCOUNTER — Ambulatory Visit: Payer: Self-pay

## 2018-05-15 NOTE — Telephone Encounter (Signed)
Pt c/o left side of upper lip edema. Pt edema began of Friday. On Friday, pt had upper lip swelling. Pt thought it could have been a food allergy. Saturday the pt's whole face became swollen and he took 1 Benadryl and the swelling improved. Today he woke up and the left half of upper lip was swollen. Pt took 1 Benadryl at 8:30 and at lunch. Pt stated that his lip is already improving. Pt also c/o aching ribs, back and intermittent sore throat pain. Pt given care advice and pt verbalized understanding. Appt made for pt 05/16/18 at 8:45 am with Olevia PerchesMegan Johnson DO.  Reason for Disposition . Lip swelling lasts > 3 days  Answer Assessment - Initial Assessment Questions 1. ONSET: "When did the swelling start?" (e.g., minutes, hours, days)     Friday 2. SEVERITY: "How swollen is it?"     Left half of lip 3. ITCHING: "Is there any itching?" If so, ask: "How much?"   (Scale 1-10; mild, moderate or severe)     no 4. PAIN: "Is the swelling painful to touch?" If so, ask: "How painful is it?"   (Scale 1-10; mild, moderate or severe)    Body aches none from the lip edema 5. CAUSE: "What do you think is causing the lip swelling?"     Pt does not know 6. RECURRENT SYMPTOM: "Have you had lip swelling before?" If so, ask: "When was the last time?" "What happened that time?"    Yes  from a bee sting 7. OTHER SYMPTOMS: "Do you have any other symptoms?" (e.g., toothache)    Aching ribs, neck , back, intermittent sore throat pain 8. PREGNANCY: "Is there any chance you are pregnant?" "When was your last menstrual period?"     n/a  Protocols used: [facility]

## 2018-05-16 ENCOUNTER — Encounter: Payer: Self-pay | Admitting: Family Medicine

## 2018-05-16 ENCOUNTER — Ambulatory Visit (INDEPENDENT_AMBULATORY_CARE_PROVIDER_SITE_OTHER): Payer: Self-pay | Admitting: Family Medicine

## 2018-05-16 VITALS — BP 128/85 | HR 70 | Temp 97.6°F | Wt 171.0 lb

## 2018-05-16 DIAGNOSIS — T783XXA Angioneurotic edema, initial encounter: Secondary | ICD-10-CM

## 2018-05-16 MED ORDER — TRIAMCINOLONE ACETONIDE 40 MG/ML IJ SUSP
40.0000 mg | Freq: Once | INTRAMUSCULAR | Status: AC
  Administered 2018-05-16: 40 mg via INTRAMUSCULAR

## 2018-05-16 MED ORDER — EPINEPHRINE 0.3 MG/0.3ML IJ SOAJ: 0.3000 mg | Freq: Once | INTRAMUSCULAR | 12 refills | Status: AC

## 2018-05-16 MED ORDER — PREDNISONE 10 MG PO TABS: ORAL_TABLET | ORAL | 0 refills | Status: DC

## 2018-05-16 NOTE — Patient Instructions (Signed)
Angioedema Angioedema is the sudden swelling of tissue in the body. Angioedema can affect any part of the body, but it most often affects the deeper parts of the skin, causing red, itchy patches (hives) to appear over the affected area. It often begins during the night and is found in the morning. Depending on the cause, angioedema may happen:  Only once.  Several times. It may come back in unpredictable patterns.  Repeatedly for several years. Over time, it may gradually stop coming back.  Angioedema can be life-threatening if it affects the air passages that you breathe through. What are the causes? This condition may be caused by:  Foods, such as milk, eggs, shellfish, wheat, or nuts.  Certain medicines, such as ACE inhibitors, antibiotics, nonsteroidal anti-inflammatory drugs, birth control pills, or dyes used in X-rays.  Insect stings.  Infections.  Angioedema can be inherited, and episodes can be triggered by:  Mild injury.  Dental work.  Surgery.  Stress.  Sudden changes in temperature.  Exercise.  In some cases, the cause of this condition is not known. What are the signs or symptoms? Symptoms of this condition depend on where the swelling happens. Symptoms may include:  Swollen skin.  Red, itchy patches of skin (hives).  Redness in the affected area.  Pain in the affected area.  Swollen lips or tongue.  Wheezing.  Breathing problems.  If your internal organs are involved, symptoms may also include:  Nausea.  Abdominal pain.  Vomiting.  Difficulty swallowing.  Difficulty passing urine.  How is this diagnosed? This condition may be diagnosed based on:  An exam of the affected area.  Your medical history.  Whether anyone in your family has had this condition before.  A review of any medicines you have been taking.  Tests, including: ? Allergy skin tests to see if the condition was caused by an allergic reaction. ? Blood tests to see  if the condition was caused by a gene. ? Tests to check for underlying diseases that could cause the condition.  How is this treated? Treatment for this condition depends on the cause. It may involve any of the following:  If something triggered the condition, making changes to keep it from triggering the condition again.  If the condition affects your breathing, having tubes placed in your airway to keep it open.  Taking medicines to treat symptoms or prevent future episodes. These may include: ? Antihistamines. ? Epinephrine injections. ? Steroids.  If your condition is severe, you may need to be treated at the hospital. Angioedema usually gets better in 24-48 hours. Follow these instructions at home:  Take over-the-counter and prescription medicines only as told by your health care provider.  If you were given medicines for emergency allergy treatment, always carry them with you.  Wear a medical bracelet as told by your health care provider.  If something triggers your condition, avoid the trigger, if possible.  If your condition is inherited and you are thinking about having children, talk to your health care provider. It is important to discuss the risks of passing on the condition to your children. Contact a health care provider if:  You have repeated episodes of angioedema.  Episodes of angioedema start to happen more often than they used to, even after you take steps to prevent them.  You have episodes of angioedema that are more severe than they have been before, even after you take steps to prevent them.  You are thinking about having children. Get   help right away if:  You have severe swelling of your mouth, tongue, or lips.  You have trouble breathing.  You have trouble swallowing.  You faint. This information is not intended to replace advice given to you by your health care provider. Make sure you discuss any questions you have with your health care  provider. Document Released: 08/09/2001 Document Revised: 12/27/2015 Document Reviewed: 12/09/2015 Elsevier Interactive Patient Education  2018 [employer]  

## 2018-05-16 NOTE — Progress Notes (Signed)
BP 128/85   Pulse 70   Temp 97.6 F (36.4 C) (Oral)   Wt 171 lb (77.6 kg)   SpO2 99%   BMI 25.25 kg/m    Subjective:    Patient ID: Jacob Key, male    DOB: 12/05/1980, 37 y.o.   MRN: [id number]  HPI: Jacob Ladendrew [place]kington is a 37 y.o. male  Chief Complaint  Patient presents with  . Allergic Reaction    a few days ago. Edema. Started on Friday with swollen upper lip. Saturday his whole face was swollen. Been taking benadryl to control. Has been better but woke up w/ some edema in lip yestrerday   Had a stomach virus about 2 weeks ago. Started 5 days ago with a swollen face, and sore throat. Body aches. No fevers. No chills or sweats. Has been taking benadryl with some benefit. Not sure if he ate anything that he hadn't had before. Did have some swelling of the back of his tongue, but that is gone now. Otherwise feeling well with no other concerns or complaints at this time.   Relevant past medical, surgical, family and social history reviewed and updated as indicated. Interim medical history since our last visit reviewed. Allergies and medications reviewed and updated.  Review of Systems  Constitutional: Negative.   HENT: Positive for facial swelling and sore throat. Negative for congestion, dental problem, drooling, ear discharge, ear pain, hearing loss, mouth sores, nosebleeds, postnasal drip, rhinorrhea, sinus pressure, sinus pain, sneezing, tinnitus, trouble swallowing and voice change.   Eyes: Negative.   Respiratory: Negative.   Cardiovascular: Negative.   Gastrointestinal: Negative.   Musculoskeletal: Positive for myalgias. Negative for arthralgias, back pain, gait problem, joint swelling, neck pain and neck stiffness.  Skin: Negative.   Neurological: Negative.   Psychiatric/Behavioral: Negative.     Per HPI unless specifically indicated above     Objective:    BP 128/85   Pulse 70   Temp 97.6 F (36.4 C) (Oral)   Wt 171 lb (77.6 kg)   SpO2 99%   BMI 25.25  kg/m   Wt Readings from Last 3 Encounters:  05/16/18 171 lb (77.6 kg)  01/27/18 170 lb 4.8 oz (77.2 kg)  02/06/16 180 lb (81.6 kg)    Physical Exam  Constitutional: He is oriented to person, place, and time. He appears well-developed and well-nourished. No distress.  HENT:  Head: Normocephalic and atraumatic.  Right Ear: Hearing and external ear normal.  Left Ear: Hearing and external ear normal.  Nose: Nose normal.  Mouth/Throat: Oropharynx is clear and moist. No oropharyngeal exudate.  Mild swelling of upper lip  Eyes: Pupils are equal, round, and reactive to light. Conjunctivae, EOM and lids are normal. Right eye exhibits no discharge. Left eye exhibits no discharge. No scleral icterus.  Neck: Normal range of motion. Neck supple. No JVD present. No tracheal deviation present. No thyromegaly present.  Cardiovascular: Normal rate, regular rhythm, normal heart sounds and intact distal pulses. Exam reveals no gallop and no friction rub.  No murmur heard. Pulmonary/Chest: Effort normal and breath sounds normal. No stridor. No respiratory distress. He has no wheezes. He has no rales. He exhibits no tenderness.  Musculoskeletal: Normal range of motion.  Lymphadenopathy:    He has no cervical adenopathy.  Neurological: He is alert and oriented to person, place, and time.  Skin: Skin is warm, dry and intact. Capillary refill takes less than 2 seconds. No rash noted. He is not diaphoretic. No erythema. No pallor.  Psychiatric: He has a normal mood and affect. His speech is normal and behavior is normal. Judgment and thought content normal. Cognition and memory are normal.  Nursing note and vitals reviewed.   Results for orders placed or performed in visit on 01/27/18  Lipid Panel w/o Chol/HDL Ratio  Result Value Ref Range   Cholesterol, Total 226 (H) 100 - 199 mg/dL   Triglycerides 324 (H) 0 - 149 mg/dL   HDL 52 >40 mg/dL   VLDL Cholesterol Cal 46 (H) 5 - 40 mg/dL   LDL Calculated 102  (H) 0 - 99 mg/dL      Assessment & Plan:   Problem List Items Addressed This Visit    None    Visit Diagnoses    Angioedema of lips, initial encounter    -  Primary   Resolving. Will treat with triamcinalone shot, zyrtec, benadryl and prednisone. Offered referral to allergist- declined. Call if not getting better or worse.    Relevant Medications   triamcinolone acetonide (KENALOG-40) injection 40 mg       Follow up plan: Return if symptoms worsen or fail to improve.

## 2019-02-02 ENCOUNTER — Other Ambulatory Visit: Payer: Self-pay | Admitting: Family Medicine

## 2019-02-03 ENCOUNTER — Other Ambulatory Visit: Payer: Self-pay | Admitting: Family Medicine

## 2019-02-05 ENCOUNTER — Telehealth: Payer: Self-pay | Admitting: Family Medicine

## 2019-02-05 MED ORDER — PAROXETINE HCL 40 MG PO TABS: 40.0000 mg | ORAL_TABLET | Freq: Every day | ORAL | 0 refills | Status: DC

## 2019-02-05 NOTE — Telephone Encounter (Signed)
Rx sent to his pharmacy- OK to wait until his follow up in 1 month.

## 2019-02-05 NOTE — Telephone Encounter (Signed)
Copied from [place]. Topic: Quick Communication - See Telephone Encounter >> Feb 05, 2019  3:26 PM Loma Boston wrote: CRM for notification. See Telephone encounter for: 02/05/19. Pt has called in very upset not being able to receive PARoxetine (PAXIL) 40 MG tablet [Pharmacy Med Name: PARoxetine HCl 40 MG Oral Tablet PT was refused script but wants 10 to do till the end of the month till he has his appt., offered to move appt up but he is not happy, wants his dr to call him [phone] unsure as it seems that he does need an appointment before any meds can be issued

## 2019-03-12 ENCOUNTER — Other Ambulatory Visit: Payer: Self-pay

## 2019-03-12 ENCOUNTER — Encounter: Payer: Self-pay | Admitting: Family Medicine

## 2019-03-12 ENCOUNTER — Ambulatory Visit (INDEPENDENT_AMBULATORY_CARE_PROVIDER_SITE_OTHER): Payer: BC Managed Care – PPO | Admitting: Family Medicine

## 2019-03-12 VITALS — BP 131/83 | HR 61 | Temp 98.5°F | Ht 68.5 in | Wt 171.0 lb

## 2019-03-12 DIAGNOSIS — E782 Mixed hyperlipidemia: Secondary | ICD-10-CM | POA: Diagnosis not present

## 2019-03-12 DIAGNOSIS — Z0000 Encounter for general adult medical examination without abnormal findings: Secondary | ICD-10-CM

## 2019-03-12 DIAGNOSIS — F331 Major depressive disorder, recurrent, moderate: Secondary | ICD-10-CM

## 2019-03-12 LAB — UA/M W/RFLX CULTURE, ROUTINE
Bilirubin, UA: NEGATIVE
Glucose, UA: NEGATIVE
Leukocytes,UA: NEGATIVE
Nitrite, UA: NEGATIVE
Protein,UA: NEGATIVE
RBC, UA: NEGATIVE
Specific Gravity, UA: 1.025 (ref 1.005–1.030)
Urobilinogen, Ur: 4 mg/dL — ABNORMAL HIGH (ref 0.2–1.0)
pH, UA: 7 (ref 5.0–7.5)

## 2019-03-12 MED ORDER — PAROXETINE HCL 40 MG PO TABS: 40.0000 mg | ORAL_TABLET | Freq: Every day | ORAL | 1 refills | Status: DC

## 2019-03-12 NOTE — Assessment & Plan Note (Signed)
Rechecking labs today. Treat as needed. Call with any concerns.  

## 2019-03-12 NOTE — Patient Instructions (Signed)

## 2019-03-12 NOTE — Assessment & Plan Note (Signed)
Under good control on current regimen. Continue current regimen. Continue to monitor. Call with any concerns. Refills given.   

## 2019-03-12 NOTE — Progress Notes (Signed)
BP 131/83   Pulse 61   Temp 98.5 F (36.9 C) (Oral)   Ht 5' 8.5" (1.74 m)   Wt 171 lb (77.6 kg)   SpO2 99%   BMI 25.62 kg/m    Subjective:    Patient ID: Jacob Key, male    DOB: Apr 12, 1981, 38 y.o.   MRN: [id number]  HPI: Jacob Key is a 38 y.o. male presenting on 03/12/2019 for comprehensive medical examination. Current medical complaints include:  DEPRESSION Mood status: controlled Satisfied with current treatment?: no Symptom severity: mild  Duration of current treatment : chronic Side effects: no Medication compliance: excellent compliance Psychotherapy/counseling: no  Previous psychiatric medications: paxil Depressed mood: no Anxious mood: no Anhedonia: no Significant weight loss or gain: no Insomnia: no  Fatigue: no Feelings of worthlessness or guilt: no Impaired concentration/indecisiveness: no Suicidal ideations: no Hopelessness: no Crying spells: no Depression screen [facility] 2/9 03/12/2019 01/27/2018 11/20/2015 10/16/2015 04/11/2015  Decreased Interest 0 0 0 0 0  Down, Depressed, Hopeless 0 0 1 2 1   PHQ - 2 Score 0 0 1 2 1   Altered sleeping 1 0 1 1 0  Tired, decreased energy 0 0 0 0 0  Change in appetite 0 0 1 1 1   Feeling bad or failure about yourself  0 0 2 2 1   Trouble concentrating 0 0 1 0 0  Moving slowly or fidgety/restless 0 1 1 1  0  Suicidal thoughts 0 0 0 0 0  PHQ-9 Score 1 1 7 7 3   Difficult doing work/chores Not difficult at all - - - Somewhat difficult   GAD 7 : Generalized Anxiety Score 03/12/2019 10/16/2015  Nervous, Anxious, on Edge 1 2  Control/stop worrying 1 2  Worry too much - different things 1 2  Trouble relaxing 0 1  Restless 1 0  Easily annoyed or irritable 0 1  Afraid - awful might happen 1 2  Total GAD 7 Score 5 10  Anxiety Difficulty Not difficult at all Somewhat difficult   HYPERLIPIDEMIA Hyperlipidemia status: excellent compliance Satisfied with current treatment?  yes Side effects:  Not on anything Supplements: none  Aspirin:  no The ASCVD Risk score DC Jr., et al., 2013) failed to calculate for the following reasons:   The 2013 ASCVD risk score is only valid for ages 63 to 11 Chest pain:  no Coronary artery disease:  no  He currently lives with: alone Interim Problems from his last visit: no  Depression Screen done today and results listed below:  Depression screen [facility] 2/9 03/12/2019 01/27/2018 11/20/2015 10/16/2015 04/11/2015  Decreased Interest 0 0 0 0 0  Down, Depressed, Hopeless 0 0 1 2 1   PHQ - 2 Score 0 0 1 2 1   Altered sleeping 1 0 1 1 0  Tired, decreased energy 0 0 0 0 0  Change in appetite 0 0 1 1 1   Feeling bad or failure about yourself  0 0 2 2 1   Trouble concentrating 0 0 1 0 0  Moving slowly or fidgety/restless 0 1 1 1  0  Suicidal thoughts 0 0 0 0 0  PHQ-9 Score 1 1 7 7 3   Difficult doing work/chores Not difficult at all - - - Somewhat difficult    Past Medical History:  Past Medical History:  Diagnosis Date  . Depression     Surgical History:  Past Surgical History:  Procedure Laterality Date  . FACIAL RECONSTRUCTION SURGERY     Lower face  . TONSILLECTOMY  Medications:  No current outpatient medications on file prior to visit.   No current facility-administered medications on file prior to visit.     Allergies:  No Known Allergies  Social History:  Social History   Socioeconomic History  . Marital status: Single    Spouse name: Not on file  . Number of children: Not on file  . Years of education: Not on file  . Highest education level: Not on file  Occupational History  . Not on file  Social Needs  . Financial resource strain: Not on file  . Food insecurity    Worry: Not on file    Inability: Not on file  . Transportation needs    Medical: Not on file    Non-medical: Not on file  Tobacco Use  . Smoking status: Current Every Day Smoker    Packs/day: 0.50    Years: 0.50    Pack years: 0.25    Types: Cigarettes, E-cigarettes    Start date:  01/18/2015  . Smokeless tobacco: Never Used  Substance and Sexual Activity  . Alcohol use: Yes    Alcohol/week: 20.0 standard drinks    Types: 20 Cans of beer per week    Comment: moderate  . Drug use: No  . Sexual activity: Yes  Lifestyle  . Physical activity    Days per week: Not on file    Minutes per session: Not on file  . Stress: Not on file  Relationships  . Social Herbalist on phone: Not on file    Gets together: Not on file    Attends religious service: Not on file    Active member of club or organization: Not on file    Attends meetings of clubs or organizations: Not on file    Relationship status: Not on file  . Intimate partner violence    Fear of current or ex partner: Not on file    Emotionally abused: Not on file    Physically abused: Not on file    Forced sexual activity: Not on file  Other Topics Concern  . Not on file  Social History Narrative  . Not on file   Social History   Tobacco Use  Smoking Status Current Every Day Smoker  . Packs/day: 0.50  . Years: 0.50  . Pack years: 0.25  . Types: Cigarettes, E-cigarettes  . Start date: 01/18/2015  Smokeless Tobacco Never Used   Social History   Substance and Sexual Activity  Alcohol Use Yes  . Alcohol/week: 20.0 standard drinks  . Types: 20 Cans of beer per week   Comment: moderate    Family History:  Family History  Problem Relation Age of Onset  . Hyperlipidemia Mother   . Hyperlipidemia Father   . Alcohol abuse Maternal Grandmother   . Heart disease Maternal Grandmother   . Heart disease Maternal Grandfather   . Alzheimer's disease Paternal Grandfather     Past medical history, surgical history, medications, allergies, family history and social history reviewed with patient today and changes made to appropriate areas of the chart.   Review of Systems  Constitutional: Negative.   HENT: Positive for ear pain. Negative for congestion, ear discharge, hearing loss, nosebleeds, sinus  pain, sore throat and tinnitus.   Eyes: Negative.   Respiratory: Negative.  Negative for stridor.   Cardiovascular: Negative.   Gastrointestinal: Positive for heartburn. Negative for abdominal pain, blood in stool, constipation, diarrhea, melena, nausea and vomiting.  Genitourinary: Negative.  Musculoskeletal: Negative.   Skin: Negative.   Neurological: Negative.   Endo/Heme/Allergies: Positive for environmental allergies. Negative for polydipsia. Does not bruise/bleed easily.  Psychiatric/Behavioral: Negative.     All other ROS negative except what is listed above and in the HPI.      Objective:    BP 131/83   Pulse 61   Temp 98.5 F (36.9 C) (Oral)   Ht 5' 8.5" (1.74 m)   Wt 171 lb (77.6 kg)   SpO2 99%   BMI 25.62 kg/m   Wt Readings from Last 3 Encounters:  03/12/19 171 lb (77.6 kg)  05/16/18 171 lb (77.6 kg)  01/27/18 170 lb 4.8 oz (77.2 kg)    Physical Exam Vitals signs and nursing note reviewed.  Constitutional:      General: He is not in acute distress.    Appearance: Normal appearance. He is normal weight. He is not ill-appearing, toxic-appearing or diaphoretic.  HENT:     Head: Normocephalic and atraumatic.     Right Ear: Tympanic membrane, ear canal and external ear normal. There is no impacted cerumen.     Left Ear: Tympanic membrane, ear canal and external ear normal. There is no impacted cerumen.     Nose: Nose normal. No congestion or rhinorrhea.     Mouth/Throat:     Mouth: Mucous membranes are moist.     Pharynx: Oropharynx is clear. No oropharyngeal exudate or posterior oropharyngeal erythema.     Comments: Scaring, poor dentition Eyes:     General: No scleral icterus.       Right eye: No discharge.        Left eye: No discharge.     Extraocular Movements: Extraocular movements intact.     Conjunctiva/sclera: Conjunctivae normal.     Pupils: Pupils are equal, round, and reactive to light.  Neck:     Musculoskeletal: Normal range of motion and  neck supple. No neck rigidity or muscular tenderness.     Vascular: No carotid bruit.  Cardiovascular:     Rate and Rhythm: Normal rate and regular rhythm.     Pulses: Normal pulses.     Heart sounds: No murmur. No friction rub. No gallop.   Pulmonary:     Effort: Pulmonary effort is normal. No respiratory distress.     Breath sounds: Normal breath sounds. No stridor. No wheezing, rhonchi or rales.  Chest:     Chest wall: No tenderness.  Abdominal:     General: Abdomen is flat. Bowel sounds are normal. There is no distension.     Palpations: Abdomen is soft. There is no mass.     Tenderness: There is no abdominal tenderness. There is no right CVA tenderness, left CVA tenderness, guarding or rebound.     Hernia: No hernia is present.  Genitourinary:    Comments: Genital exam deferred with shared decision making Musculoskeletal:        General: No swelling, tenderness, deformity or signs of injury.     Right lower leg: No edema.     Left lower leg: No edema.  Lymphadenopathy:     Cervical: No cervical adenopathy.  Skin:    General: Skin is warm and dry.     Capillary Refill: Capillary refill takes less than 2 seconds.     Coloration: Skin is not jaundiced or pale.     Findings: No bruising, erythema, lesion or rash.  Neurological:     General: No focal deficit present.     Mental  Status: He is alert and oriented to person, place, and time.     Cranial Nerves: No cranial nerve deficit.     Sensory: No sensory deficit.     Motor: No weakness.     Coordination: Coordination normal.     Gait: Gait normal.     Deep Tendon Reflexes: Reflexes normal.  Psychiatric:        Mood and Affect: Mood normal.        Behavior: Behavior normal.        Thought Content: Thought content normal.        Judgment: Judgment normal.     Results for orders placed or performed in visit on 01/27/18  Lipid Panel w/o Chol/HDL Ratio  Result Value Ref Range   Cholesterol, Total 226 (H) 100 - 199 mg/dL    Triglycerides 161 (H) 0 - 149 mg/dL   HDL 52 >09 mg/dL   VLDL Cholesterol Cal 46 (H) 5 - 40 mg/dL   LDL Calculated 604 (H) 0 - 99 mg/dL      Assessment & Plan:   Problem List Items Addressed This Visit      Other   Depression    Under good control on current regimen. Continue current regimen. Continue to monitor. Call with any concerns. Refills given.        Relevant Medications   PARoxetine (PAXIL) 40 MG tablet   Other Relevant Orders   CBC with Differential/Platelet   Comprehensive metabolic panel   TSH   Hyperlipidemia    Rechecking labs today. Treat as needed. Call with any concerns.       Relevant Orders   Comprehensive metabolic panel   Lipid Panel w/o Chol/HDL Ratio    Other Visit Diagnoses    Routine general medical examination at a health care facility    -  Primary   Vaccines declined. Screening labs checked today. Continue diet and exercise. Call with any concerns. Continue to monitor.    Relevant Orders   CBC with Differential/Platelet   Comprehensive metabolic panel   Lipid Panel w/o Chol/HDL Ratio   HIV Antibody (routine testing w rflx)   TSH   UA/M w/rflx Culture, Routine      LABORATORY TESTING:  Health maintenance labs ordered today as discussed above.   IMMUNIZATIONS:   - Tdap: Tetanus vaccination status reviewed: last tetanus booster within 10 years. - Influenza: Refused - Pneumovax: Refused  PATIENT COUNSELING:    Sexuality: Discussed sexually transmitted diseases, partner selection, use of condoms, avoidance of unintended pregnancy  and contraceptive alternatives.   Advised to avoid cigarette smoking.  I discussed with the patient that most people either abstain from alcohol or drink within safe limits (<=14/week and <=4 drinks/occasion for males, <=7/weeks and <= 3 drinks/occasion for females) and that the risk for alcohol disorders and other health effects rises proportionally with the number of drinks per week and how often a drinker  exceeds daily limits.  Discussed cessation/primary prevention of drug use and availability of treatment for abuse.   Diet: Encouraged to adjust caloric intake to maintain  or achieve ideal body weight, to reduce intake of dietary saturated fat and total fat, to limit sodium intake by avoiding high sodium foods and not adding table salt, and to maintain adequate dietary potassium and calcium preferably from fresh fruits, vegetables, and low-fat dairy products.    stressed the importance of regular exercise  Injury prevention: Discussed safety belts, safety helmets, smoke detector, smoking near bedding or upholstery.  Dental health: Discussed importance of regular tooth brushing, flossing, and dental visits.   Follow up plan: NEXT PREVENTATIVE PHYSICAL DUE IN 1 YEAR. Return in about 6 months (around 09/09/2019) for Follow up mood.

## 2019-03-13 LAB — COMPREHENSIVE METABOLIC PANEL
ALT: 25 IU/L (ref 0–44)
AST: 28 IU/L (ref 0–40)
Albumin/Globulin Ratio: 2 (ref 1.2–2.2)
Albumin: 4.7 g/dL (ref 4.0–5.0)
Alkaline Phosphatase: 65 IU/L (ref 39–117)
BUN/Creatinine Ratio: 19 (ref 9–20)
BUN: 19 mg/dL (ref 6–20)
Bilirubin Total: 0.4 mg/dL (ref 0.0–1.2)
CO2: 23 mmol/L (ref 20–29)
Calcium: 9.6 mg/dL (ref 8.7–10.2)
Chloride: 103 mmol/L (ref 96–106)
Creatinine, Ser: 1.02 mg/dL (ref 0.76–1.27)
GFR calc Af Amer: 107 mL/min/{1.73_m2} (ref >59)
GFR calc non Af Amer: 93 mL/min/{1.73_m2} (ref >59)
Globulin, Total: 2.4 g/dL (ref 1.5–4.5)
Glucose: 87 mg/dL (ref 65–99)
Potassium: 4.2 mmol/L (ref 3.5–5.2)
Sodium: 140 mmol/L (ref 134–144)
Total Protein: 7.1 g/dL (ref 6.0–8.5)

## 2019-03-13 LAB — LIPID PANEL W/O CHOL/HDL RATIO
Cholesterol, Total: 246 mg/dL — ABNORMAL HIGH (ref 100–199)
HDL: 50 mg/dL (ref >39)
LDL Chol Calc (NIH): 160 mg/dL — ABNORMAL HIGH (ref 0–99)
Triglycerides: 196 mg/dL — ABNORMAL HIGH (ref 0–149)
VLDL Cholesterol Cal: 36 mg/dL (ref 5–40)

## 2019-03-13 LAB — CBC WITH DIFFERENTIAL/PLATELET
Basophils Absolute: 0 10*3/uL (ref 0.0–0.2)
Basos: 0 %
EOS (ABSOLUTE): 0.1 10*3/uL (ref 0.0–0.4)
Eos: 2 %
Hematocrit: 43.7 % (ref 37.5–51.0)
Hemoglobin: 15.3 g/dL (ref 13.0–17.7)
Immature Grans (Abs): 0 10*3/uL (ref 0.0–0.1)
Immature Granulocytes: 0 %
Lymphocytes Absolute: 1.4 10*3/uL (ref 0.7–3.1)
Lymphs: 28 %
MCH: 31.5 pg (ref 26.6–33.0)
MCHC: 35 g/dL (ref 31.5–35.7)
MCV: 90 fL (ref 79–97)
Monocytes Absolute: 0.3 10*3/uL (ref 0.1–0.9)
Monocytes: 7 %
Neutrophils Absolute: 3.1 10*3/uL (ref 1.4–7.0)
Neutrophils: 63 %
Platelets: 265 10*3/uL (ref 150–450)
RBC: 4.86 x10E6/uL (ref 4.14–5.80)
RDW: 12.5 % (ref 11.6–15.4)
WBC: 4.9 10*3/uL (ref 3.4–10.8)

## 2019-03-13 LAB — TSH: TSH: 1.03 u[IU]/mL (ref 0.450–4.500)

## 2019-03-13 LAB — HIV ANTIBODY (ROUTINE TESTING W REFLEX): HIV Screen 4th Generation wRfx: NONREACTIVE

## 2019-07-19 DIAGNOSIS — M62051 Separation of muscle (nontraumatic), right thigh: Secondary | ICD-10-CM | POA: Diagnosis not present

## 2019-07-19 DIAGNOSIS — M9903 Segmental and somatic dysfunction of lumbar region: Secondary | ICD-10-CM | POA: Diagnosis not present

## 2019-07-19 DIAGNOSIS — S39012A Strain of muscle, fascia and tendon of lower back, initial encounter: Secondary | ICD-10-CM | POA: Diagnosis not present

## 2019-07-19 DIAGNOSIS — M9902 Segmental and somatic dysfunction of thoracic region: Secondary | ICD-10-CM | POA: Diagnosis not present

## 2019-07-19 DIAGNOSIS — M62830 Muscle spasm of back: Secondary | ICD-10-CM | POA: Diagnosis not present

## 2019-07-19 DIAGNOSIS — M9906 Segmental and somatic dysfunction of lower extremity: Secondary | ICD-10-CM | POA: Diagnosis not present

## 2019-08-24 ENCOUNTER — Encounter: Payer: Self-pay | Admitting: Family Medicine

## 2019-08-24 ENCOUNTER — Other Ambulatory Visit: Payer: Self-pay

## 2019-08-24 ENCOUNTER — Ambulatory Visit (INDEPENDENT_AMBULATORY_CARE_PROVIDER_SITE_OTHER): Payer: BC Managed Care – PPO | Admitting: Family Medicine

## 2019-08-24 ENCOUNTER — Ambulatory Visit: Payer: BC Managed Care – PPO | Admitting: Family Medicine

## 2019-08-24 VITALS — BP 138/87 | HR 56 | Temp 97.6°F | Ht 68.5 in | Wt 173.0 lb

## 2019-08-24 DIAGNOSIS — F17210 Nicotine dependence, cigarettes, uncomplicated: Secondary | ICD-10-CM | POA: Diagnosis not present

## 2019-08-24 DIAGNOSIS — E782 Mixed hyperlipidemia: Secondary | ICD-10-CM

## 2019-08-24 DIAGNOSIS — M545 Low back pain, unspecified: Secondary | ICD-10-CM

## 2019-08-24 MED ORDER — CYCLOBENZAPRINE HCL 10 MG PO TABS: 10.0000 mg | ORAL_TABLET | Freq: Three times a day (TID) | ORAL | 0 refills | Status: DC | PRN

## 2019-08-24 MED ORDER — IBUPROFEN 800 MG PO TABS: 800.0000 mg | ORAL_TABLET | Freq: Three times a day (TID) | ORAL | 0 refills | Status: DC | PRN

## 2019-08-24 MED ORDER — TRIAMCINOLONE ACETONIDE 40 MG/ML IJ SUSP
40.0000 mg | Freq: Once | INTRAMUSCULAR | Status: AC
  Administered 2019-08-24: 40 mg via INTRAMUSCULAR

## 2019-08-24 MED ORDER — ATORVASTATIN CALCIUM 20 MG PO TABS: 20.0000 mg | ORAL_TABLET | Freq: Every day | ORAL | 1 refills | Status: DC

## 2019-08-24 NOTE — Patient Instructions (Signed)
[facility]

## 2019-08-25 LAB — LIPID PANEL W/O CHOL/HDL RATIO
Cholesterol, Total: 234 mg/dL — ABNORMAL HIGH (ref 100–199)
HDL: 51 mg/dL (ref >39)
LDL Chol Calc (NIH): 166 mg/dL — ABNORMAL HIGH (ref 0–99)
Triglycerides: 94 mg/dL (ref 0–149)
VLDL Cholesterol Cal: 17 mg/dL (ref 5–40)

## 2019-08-26 DIAGNOSIS — F17210 Nicotine dependence, cigarettes, uncomplicated: Secondary | ICD-10-CM | POA: Insufficient documentation

## 2019-08-26 NOTE — Assessment & Plan Note (Signed)
Pt wishes to start medication at this time in addition to continued diet and exercise, efforts toward smoking cessation. Will start lipitor and recheck in 6 months

## 2019-08-26 NOTE — Progress Notes (Signed)
BP 138/87   Pulse (!) 56   Temp 97.6 F (36.4 C) (Oral)   Ht 5' 8.5" (1.74 m)   Wt 173 lb (78.5 kg)   SpO2 98%   BMI 25.92 kg/m    Subjective:    Patient ID: Jacob Key, male    DOB: 12-02-1980, 39 y.o.   MRN: [id number]  HPI: Jacob Key is a 39 y.o. male  Chief Complaint  Patient presents with  . Back Pain    right sided x about 2 months  . Hip Pain  . Knee Pain  . Hyperlipidemia    pt would like a blood work to check his cholesterol   About 2 months ago started with lower back pain, b/l hip pain but now mostly right hip. Can be sharp or dull, mostly painful with movement. Now the past week has also been having right knee pain. Went to a [employer] and got massage and adjustment but states that was too expensive and time consuming to go to all follow ups. Also noted no improvement. Trying ibuprofen or goody powder, massager and heat with temporary relief. Runs about 3 miles per day, which does seem to loosen his muscles in legs and temporarily ease his sxs. No swelling, redness, point tenderness, CP, SOB, palpitations, recent injury.   Also concerned about his continued elevated cholesterol despite running 3 miles a day and trying to watch what he eats. Smokes cigarettes, trying to cut back but not ready to quit. Both parents have high cholesterol as well. Denies any CP, SOB, claudication.   Relevant past medical, surgical, family and social history reviewed and updated as indicated. Interim medical history since our last visit reviewed. Allergies and medications reviewed and updated.  Review of Systems  Per HPI unless specifically indicated above     Objective:    BP 138/87   Pulse (!) 56   Temp 97.6 F (36.4 C) (Oral)   Ht 5' 8.5" (1.74 m)   Wt 173 lb (78.5 kg)   SpO2 98%   BMI 25.92 kg/m   Wt Readings from Last 3 Encounters:  08/24/19 173 lb (78.5 kg)  03/12/19 171 lb (77.6 kg)  05/16/18 171 lb (77.6 kg)    Physical Exam Vitals and nursing note  reviewed.  Constitutional:      Appearance: Normal appearance.  HENT:     Head: Atraumatic.  Eyes:     Extraocular Movements: Extraocular movements intact.     Conjunctiva/sclera: Conjunctivae normal.  Cardiovascular:     Rate and Rhythm: Normal rate and regular rhythm.     Pulses: Normal pulses.  Pulmonary:     Effort: Pulmonary effort is normal.     Breath sounds: Normal breath sounds.  Musculoskeletal:        General: Tenderness (ttp right lumbar paraspinal muscles, piriformis, and IT band and quadricep ) present. No swelling, deformity or signs of injury. Normal range of motion.     Cervical back: Normal range of motion and neck supple.  Skin:    General: Skin is warm and dry.  Neurological:     General: No focal deficit present.     Mental Status: He is oriented to person, place, and time.     Sensory: No sensory deficit.  Psychiatric:        Mood and Affect: Mood normal.        Thought Content: Thought content normal.        Judgment: Judgment normal.  Results for orders placed or performed in visit on 08/24/19  Lipid Panel w/o Chol/HDL Ratio  Result Value Ref Range   Cholesterol, Total 234 (H) 100 - 199 mg/dL   Triglycerides 94 0 - 149 mg/dL   HDL 51 >26 mg/dL   VLDL Cholesterol Cal 17 5 - 40 mg/dL   LDL Chol Calc (NIH) 712 (H) 0 - 99 mg/dL      Assessment & Plan:   Problem List Items Addressed This Visit      Other   Hyperlipidemia - Primary    Pt wishes to start medication at this time in addition to continued diet and exercise, efforts toward smoking cessation. Will start lipitor and recheck in 6 months      Relevant Medications   atorvastatin (LIPITOR) 20 MG tablet   Other Relevant Orders   Lipid Panel w/o Chol/HDL Ratio (Completed)   Cigarette smoker    Working on cutting back but not ready to quit entirely just yet. Counseling given       Other Visit Diagnoses    Acute right-sided low back pain without sciatica       Suspect his pain sxs  muscular in nature, recommended flexeril, NSAIDs, stretches, rest from strenuous exercise for short time. IM kenalog given today   Relevant Medications   triamcinolone acetonide (KENALOG-40) injection 40 mg (Completed)   cyclobenzaprine (FLEXERIL) 10 MG tablet   ibuprofen (ADVIL) 800 MG tablet   Other Relevant Orders   DG Lumbar Spine Complete       Follow up plan: Return in about 6 months (around 02/24/2020) for CPE after 9/28.

## 2019-08-26 NOTE — Assessment & Plan Note (Signed)
Working on cutting back but not ready to quit entirely just yet. Counseling given

## 2019-08-28 ENCOUNTER — Other Ambulatory Visit: Payer: Self-pay

## 2019-08-28 ENCOUNTER — Ambulatory Visit
Admission: RE | Admit: 2019-08-28 | Discharge: 2019-08-28 | Disposition: A | Discharge status: Home / Self Care | Payer: BC Managed Care – PPO | Attending: Family Medicine | Admitting: Family Medicine

## 2019-08-28 ENCOUNTER — Ambulatory Visit
Admission: RE | Admit: 2019-08-28 | Discharge: 2019-08-28 | Disposition: A | Discharge status: Home / Self Care | Payer: BC Managed Care – PPO | Source: Ambulatory Visit | Attending: Family Medicine | Admitting: Family Medicine

## 2019-08-28 DIAGNOSIS — M545 Low back pain, unspecified: Secondary | ICD-10-CM

## 2019-08-29 ENCOUNTER — Telehealth: Payer: Self-pay | Admitting: Family Medicine

## 2019-08-29 DIAGNOSIS — M545 Low back pain, unspecified: Secondary | ICD-10-CM

## 2019-08-29 NOTE — Telephone Encounter (Signed)
Routing to provider for advise

## 2019-08-29 NOTE — Telephone Encounter (Signed)
Copied from CRM [id number]. Topic: General - Other >> Aug 29, 2019 12:39 PM Tamela Oddi wrote: Reason for CRM: Patient would like the nurse or doctor to call him to explain his x-ray results.  CB# [phone]

## 2019-08-30 NOTE — Telephone Encounter (Signed)
From results: "Your x-ray shows some degenerative changes in the area of your pain. I would recommend getting you over to orthopedics to see if joint injections or further imaging are recommended. Let me know if you're open to this"

## 2019-08-30 NOTE — Telephone Encounter (Signed)
Looks like you saw him °

## 2019-08-30 NOTE — Telephone Encounter (Signed)
Tried calling patient. No answer. LVM to return phone call or we will try him again tomorrow.

## 2019-08-30 NOTE — Telephone Encounter (Signed)
Result note generated and sent via mychart, ok to review with him

## 2019-08-31 NOTE — Telephone Encounter (Signed)
Referral placed per patients request 

## 2019-08-31 NOTE — Telephone Encounter (Signed)
Called patient to discuss xray results, no answer, left a voicemail for patient to return my call.

## 2019-08-31 NOTE — Telephone Encounter (Signed)
Patient notified, and he would like to go see ortho, please refer.

## 2019-09-12 DIAGNOSIS — M545 Low back pain: Secondary | ICD-10-CM | POA: Diagnosis not present

## 2019-09-12 DIAGNOSIS — M5416 Radiculopathy, lumbar region: Secondary | ICD-10-CM | POA: Diagnosis not present

## 2019-10-12 ENCOUNTER — Ambulatory Visit: Payer: BC Managed Care – PPO | Admitting: Family Medicine

## 2019-10-12 ENCOUNTER — Other Ambulatory Visit: Payer: Self-pay

## 2019-10-12 ENCOUNTER — Encounter: Payer: Self-pay | Admitting: Family Medicine

## 2019-10-12 VITALS — BP 161/98 | HR 58 | Temp 97.7°F | Wt 173.0 lb

## 2019-10-12 DIAGNOSIS — M5441 Lumbago with sciatica, right side: Secondary | ICD-10-CM | POA: Diagnosis not present

## 2019-10-12 DIAGNOSIS — Z8709 Personal history of other diseases of the respiratory system: Secondary | ICD-10-CM

## 2019-10-12 MED ORDER — LIDOCAINE 4 % EX PTCH: 1.0000 | MEDICATED_PATCH | Freq: Two times a day (BID) | CUTANEOUS | 3 refills | Status: DC

## 2019-10-12 NOTE — Progress Notes (Signed)
BP (!) 161/98 (BP Location: Left Arm, Patient Position: Sitting, Cuff Size: Normal)   Pulse (!) 58   Temp 97.7 F (36.5 C) (Oral)   Wt 173 lb (78.5 kg)   SpO2 99%   BMI 25.92 kg/m    Subjective:    Patient ID: Jacob Key, male    DOB: 02-Mar-1981, 39 y.o.   MRN: [id number]  HPI: Jacob Key is a 39 y.o. male  Chief Complaint  Patient presents with  . Back Pain   BACK PAIN- saw the orthopedist, but they told him it was pretty much muscular as well.  Duration: 3 months Mechanism of injury: unknown Location: R>L and low back Onset: gradual Severity: moderate to severe Quality: aching and throbbing and sharp Frequency: constant Radiation: R leg below the knee Aggravating factors: lifting Alleviating factors: moving, running Status: stable Treatments attempted: rest, ice, heat, APAP, ibuprofen, aleve and HEP  Relief with NSAIDs?: significant Nighttime pain:  no Paresthesias / decreased sensation:  no Bowel / bladder incontinence:  no Fevers:  no Dysuria / urinary frequency:  no  Relevant past medical, surgical, family and social history reviewed and updated as indicated. Interim medical history since our last visit reviewed. Allergies and medications reviewed and updated.  Review of Systems  Constitutional: Negative.   Respiratory: Negative.   Cardiovascular: Negative.   Gastrointestinal: Negative.   Musculoskeletal: Positive for arthralgias, back pain and myalgias. Negative for gait problem, joint swelling, neck pain and neck stiffness.  Skin: Negative.   Neurological: Positive for numbness. Negative for dizziness, tremors, seizures, syncope, facial asymmetry, speech difficulty, weakness, light-headedness and headaches.  Psychiatric/Behavioral: Negative.     Per HPI unless specifically indicated above     Objective:    BP (!) 161/98 (BP Location: Left Arm, Patient Position: Sitting, Cuff Size: Normal)   Pulse (!) 58   Temp 97.7 F (36.5 C) (Oral)    Wt 173 lb (78.5 kg)   SpO2 99%   BMI 25.92 kg/m   Wt Readings from Last 3 Encounters:  10/12/19 173 lb (78.5 kg)  08/24/19 173 lb (78.5 kg)  03/12/19 171 lb (77.6 kg)    Physical Exam Vitals and nursing note reviewed.  Constitutional:      General: He is not in acute distress.    Appearance: Normal appearance. He is not ill-appearing, toxic-appearing or diaphoretic.  HENT:     Head: Normocephalic and atraumatic.     Right Ear: External ear normal.     Left Ear: External ear normal.     Nose: Nose normal.     Mouth/Throat:     Mouth: Mucous membranes are moist.     Pharynx: Oropharynx is clear.  Eyes:     General: No scleral icterus.       Right eye: No discharge.        Left eye: No discharge.     Extraocular Movements: Extraocular movements intact.     Conjunctiva/sclera: Conjunctivae normal.     Pupils: Pupils are equal, round, and reactive to light.  Cardiovascular:     Rate and Rhythm: Normal rate and regular rhythm.     Pulses: Normal pulses.     Heart sounds: Normal heart sounds. No murmur. No friction rub. No gallop.   Pulmonary:     Effort: Pulmonary effort is normal. No respiratory distress.     Breath sounds: Normal breath sounds. No stridor. No wheezing, rhonchi or rales.  Chest:     Chest wall: No tenderness.  Musculoskeletal:        General: Normal range of motion.     Cervical back: Normal range of motion and neck supple.  Skin:    General: Skin is warm and dry.     Capillary Refill: Capillary refill takes less than 2 seconds.     Coloration: Skin is not jaundiced or pale.     Findings: No bruising, erythema, lesion or rash.  Neurological:     General: No focal deficit present.     Mental Status: He is alert and oriented to person, place, and time. Mental status is at baseline.  Psychiatric:        Mood and Affect: Mood normal.        Behavior: Behavior normal.        Thought Content: Thought content normal.        Judgment: Judgment normal.      Results for orders placed or performed in visit on 08/24/19  Lipid Panel w/o Chol/HDL Ratio  Result Value Ref Range   Cholesterol, Total 234 (H) 100 - 199 mg/dL   Triglycerides 94 0 - 149 mg/dL   HDL 51 >00 mg/dL   VLDL Cholesterol Cal 17 5 - 40 mg/dL   LDL Chol Calc (NIH) 762 (H) 0 - 99 mg/dL      Assessment & Plan:   Problem List Items Addressed This Visit    None    Visit Diagnoses    Acute right-sided low back pain with right-sided sciatica    -  Primary   Not getting better. Cannot afford PT- will send to [place] PT school. Will get MRI. Ordered today. Start lidocaine patches. Continue to monitor. Recheck 1 month.    Relevant Orders   MR Lumbar Spine Wo Contrast   Ambulatory referral to Physical Therapy   History of URI (upper respiratory infection)       Would like to check COVID antibodies. Ordered today. Await results.    Relevant Orders   SAR CoV2 Serology (COVID 19)AB(IGG)IA       Follow up plan: Return in about 4 weeks (around 11/09/2019).

## 2019-10-13 LAB — SAR COV2 SEROLOGY (COVID19)AB(IGG),IA: DiaSorin SARS-CoV-2 Ab, IgG: NEGATIVE

## 2019-10-18 ENCOUNTER — Telehealth: Payer: Self-pay | Admitting: Family Medicine

## 2019-10-18 NOTE — Telephone Encounter (Signed)
Copied from CRM [id number]. Topic: General - Other >> Oct 18, 2019 10:16 AM Tamela Oddi wrote: Reason for CRM: Called to follow up with the nurse or doctor regarding an MRI at [facility] for the patient.  Please call to discuss at [phone], Opt#2, Opt#3, Ref# [id number]

## 2019-10-18 NOTE — Telephone Encounter (Signed)
Called number and it was [facility]. This is for patient's authorization on his MRI. Saverio Danker has been working on this referral. Routing to her to call [facility] for authorization/precert.

## 2019-10-19 NOTE — Telephone Encounter (Signed)
Pt needs printed and signed order faxed to [facility]. This location is cheaper out of pocket for the pt.   Please fax to [phone] Thank you

## 2019-10-22 NOTE — Telephone Encounter (Signed)
Absolutely.

## 2019-10-22 NOTE — Telephone Encounter (Signed)
Order faxed.

## 2019-11-02 ENCOUNTER — Ambulatory Visit: Payer: BC Managed Care – PPO | Admitting: Family Medicine

## 2019-11-05 ENCOUNTER — Ambulatory Visit: Payer: BC Managed Care – PPO

## 2019-11-05 DIAGNOSIS — Z1810 Retained metal fragments, unspecified: Secondary | ICD-10-CM | POA: Diagnosis not present

## 2019-11-05 DIAGNOSIS — R937 Abnormal findings on diagnostic imaging of other parts of musculoskeletal system: Secondary | ICD-10-CM | POA: Diagnosis not present

## 2019-11-05 DIAGNOSIS — M5441 Lumbago with sciatica, right side: Secondary | ICD-10-CM | POA: Diagnosis not present

## 2019-11-05 DIAGNOSIS — Z135 Encounter for screening for eye and ear disorders: Secondary | ICD-10-CM | POA: Diagnosis not present

## 2019-11-13 ENCOUNTER — Other Ambulatory Visit: Payer: Self-pay

## 2019-11-13 ENCOUNTER — Encounter: Payer: Self-pay | Admitting: Family Medicine

## 2019-11-13 ENCOUNTER — Ambulatory Visit: Payer: BC Managed Care – PPO | Admitting: Family Medicine

## 2019-11-13 VITALS — BP 156/92 | HR 55 | Temp 97.6°F | Ht 68.5 in | Wt 173.6 lb

## 2019-11-13 DIAGNOSIS — R030 Elevated blood-pressure reading, without diagnosis of hypertension: Secondary | ICD-10-CM

## 2019-11-13 DIAGNOSIS — E782 Mixed hyperlipidemia: Secondary | ICD-10-CM | POA: Diagnosis not present

## 2019-11-13 DIAGNOSIS — M5441 Lumbago with sciatica, right side: Secondary | ICD-10-CM | POA: Diagnosis not present

## 2019-11-13 NOTE — Patient Instructions (Signed)
DASH Eating Plan DASH stands for "Dietary Approaches to Stop Hypertension." The DASH eating plan is a healthy eating plan that has been shown to reduce high blood pressure (hypertension). It may also reduce your risk for type 2 diabetes, heart disease, and stroke. The DASH eating plan may also help with weight loss. What are tips for following this plan?  General guidelines  Avoid eating more than 2,300 mg (milligrams) of salt (sodium) a day. If you have hypertension, you may need to reduce your sodium intake to 1,500 mg a day.  Limit alcohol intake to no more than 1 drink a day for nonpregnant women and 2 drinks a day for men. One drink equals 12 oz of beer, 5 oz of wine, or 1 oz of hard liquor.  Work with your health care provider to maintain a healthy body weight or to lose weight. Ask what an ideal weight is for you.  Get at least 30 minutes of exercise that causes your heart to beat faster (aerobic exercise) most days of the week. Activities may include walking, swimming, or biking.  Work with your health care provider or diet and nutrition specialist (dietitian) to adjust your eating plan to your individual calorie needs. Reading food labels   Check food labels for the amount of sodium per serving. Choose foods with less than 5 percent of the Daily Value of sodium. Generally, foods with less than 300 mg of sodium per serving fit into this eating plan.  To find whole grains, look for the word "whole" as the first word in the ingredient list. Shopping  Buy products labeled as "low-sodium" or "no salt added."  Buy fresh foods. Avoid canned foods and premade or frozen meals. Cooking  Avoid adding salt when cooking. Use salt-free seasonings or herbs instead of table salt or sea salt. Check with your health care provider or pharmacist before using salt substitutes.  Do not fry foods. Cook foods using healthy methods such as baking, boiling, grilling, and broiling instead.  Cook with  heart-healthy oils, such as olive, canola, soybean, or sunflower oil. Meal planning  Eat a balanced diet that includes: ? 5 or more servings of fruits and vegetables each day. At each meal, try to fill half of your plate with fruits and vegetables. ? Up to 6-8 servings of whole grains each day. ? Less than 6 oz of lean meat, poultry, or fish each day. A 3-oz serving of meat is about the same size as a deck of cards. One egg equals 1 oz. ? 2 servings of low-fat dairy each day. ? A serving of nuts, seeds, or beans 5 times each week. ? Heart-healthy fats. Healthy fats called Omega-3 fatty acids are found in foods such as flaxseeds and coldwater fish, like sardines, salmon, and mackerel.  Limit how much you eat of the following: ? Canned or prepackaged foods. ? Food that is high in trans fat, such as fried foods. ? Food that is high in saturated fat, such as fatty meat. ? Sweets, desserts, sugary drinks, and other foods with added sugar. ? Full-fat dairy products.  Do not salt foods before eating.  Try to eat at least 2 vegetarian meals each week.  Eat more home-cooked food and less restaurant, buffet, and fast food.  When eating at a restaurant, ask that your food be prepared with less salt or no salt, if possible. What foods are recommended? The items listed may not be a complete list. Talk with your dietitian about   what dietary choices are best for you. Grains Whole-grain or whole-wheat bread. Whole-grain or whole-wheat pasta. Brown rice. Oatmeal. Quinoa. Bulgur. Whole-grain and low-sodium cereals. Pita bread. Low-fat, low-sodium crackers. Whole-wheat flour tortillas. Vegetables Fresh or frozen vegetables (raw, steamed, roasted, or grilled). Low-sodium or reduced-sodium tomato and vegetable juice. Low-sodium or reduced-sodium tomato sauce and tomato paste. Low-sodium or reduced-sodium canned vegetables. Fruits All fresh, dried, or frozen fruit. Canned fruit in natural juice (without  added sugar). Meat and other protein foods Skinless chicken or turkey. Ground chicken or turkey. Pork with fat trimmed off. Fish and seafood. Egg whites. Dried beans, peas, or lentils. Unsalted nuts, nut butters, and seeds. Unsalted canned beans. Lean cuts of beef with fat trimmed off. Low-sodium, lean deli meat. Dairy Low-fat (1%) or fat-free (skim) milk. Fat-free, low-fat, or reduced-fat cheeses. Nonfat, low-sodium ricotta or cottage cheese. Low-fat or nonfat yogurt. Low-fat, low-sodium cheese. Fats and oils Soft margarine without trans fats. Vegetable oil. Low-fat, reduced-fat, or light mayonnaise and salad dressings (reduced-sodium). Canola, safflower, olive, soybean, and sunflower oils. Avocado. Seasoning and other foods Herbs. Spices. Seasoning mixes without salt. Unsalted popcorn and pretzels. Fat-free sweets. What foods are not recommended? The items listed may not be a complete list. Talk with your dietitian about what dietary choices are best for you. Grains Baked goods made with fat, such as croissants, muffins, or some breads. Dry pasta or rice meal packs. Vegetables Creamed or fried vegetables. Vegetables in a cheese sauce. Regular canned vegetables (not low-sodium or reduced-sodium). Regular canned tomato sauce and paste (not low-sodium or reduced-sodium). Regular tomato and vegetable juice (not low-sodium or reduced-sodium). Pickles. Olives. Fruits Canned fruit in a light or heavy syrup. Fried fruit. Fruit in cream or butter sauce. Meat and other protein foods Fatty cuts of meat. Ribs. Fried meat. Bacon. Sausage. Bologna and other processed lunch meats. Salami. Fatback. Hotdogs. Bratwurst. Salted nuts and seeds. Canned beans with added salt. Canned or smoked fish. Whole eggs or egg yolks. Chicken or turkey with skin. Dairy Whole or 2% milk, cream, and half-and-half. Whole or full-fat cream cheese. Whole-fat or sweetened yogurt. Full-fat cheese. Nondairy creamers. Whipped toppings.  Processed cheese and cheese spreads. Fats and oils Butter. Stick margarine. Lard. Shortening. Ghee. Bacon fat. Tropical oils, such as coconut, palm kernel, or palm oil. Seasoning and other foods Salted popcorn and pretzels. Onion salt, garlic salt, seasoned salt, table salt, and sea salt. Worcestershire sauce. Tartar sauce. Barbecue sauce. Teriyaki sauce. Soy sauce, including reduced-sodium. Steak sauce. Canned and packaged gravies. Fish sauce. Oyster sauce. Cocktail sauce. Horseradish that you find on the shelf. Ketchup. Mustard. Meat flavorings and tenderizers. Bouillon cubes. Hot sauce and Tabasco sauce. Premade or packaged marinades. Premade or packaged taco seasonings. Relishes. Regular salad dressings. Where to find more information:  National Heart, Lung, and Blood Institute: www.nhlbi.nih.gov  American Heart Association: www.heart.org Summary  The DASH eating plan is a healthy eating plan that has been shown to reduce high blood pressure (hypertension). It may also reduce your risk for type 2 diabetes, heart disease, and stroke.  With the DASH eating plan, you should limit salt (sodium) intake to 2,300 mg a day. If you have hypertension, you may need to reduce your sodium intake to 1,500 mg a day.  When on the DASH eating plan, aim to eat more fresh fruits and vegetables, whole grains, lean proteins, low-fat dairy, and heart-healthy fats.  Work with your health care provider or diet and nutrition specialist (dietitian) to adjust your eating plan to your   individual calorie needs. This information is not intended to replace advice given to you by your health care provider. Make sure you discuss any questions you have with your health care provider. Document Revised: 05/13/2017 Document Reviewed: 05/24/2016 Elsevier Patient Education  2020 [employer]  

## 2019-11-13 NOTE — Progress Notes (Signed)
BP (!) 156/92 (BP Location: Left Arm, Patient Position: Sitting, Cuff Size: Normal)    Pulse (!) 55    Temp 97.6 F (36.4 C) (Oral)    Ht 5' 8.5" (1.74 m)    Wt 173 lb 9.6 oz (78.7 kg)    SpO2 99%    BMI 26.01 kg/m    Subjective:    Patient ID: Jacob Key, male    DOB: [birth year], 39 y.o.   MRN: [id number]  HPI: Jacob Key is a 39 y.o. male  Chief Complaint  Patient presents with   Back Pain   Sciatica   Playing phone tag with the PT. His MRI did not show any nerve entrapment, likely muscular. Still not feeling any better. Continues with pain and tight muscles on the R side of his back with some radiation down his R leg. Better with rest and worse with work. He has no numbness or tingling. He is otherwise doing OK.   HYPERLIPIDEMIA Hyperlipidemia status: excellent compliance Satisfied with current treatment?  yes Side effects:  no Medication compliance: excellent compliance Past cholesterol meds: atorvastatin Supplements: none Aspirin:  no The ASCVD Risk score Mikey Bussing DC Jr., et al., 2013) failed to calculate for the following reasons:   The 2013 ASCVD risk score is only valid for ages 47 to 28 Chest pain:  no Coronary artery disease:  no  ELEVATED BLOOD PRESSURE Duration of elevated BP: about a month BP monitoring frequency: not checking BP range:  Previous BP meds: no Recent stressors: yes Family history of hypertension: yes Recurrent headaches: no Visual changes: no Palpitations: no  Dyspnea: no Chest pain: no Lower extremity edema: no Dizzy/lightheaded: no Transient ischemic attacks: no   Relevant past medical, surgical, family and social history reviewed and updated as indicated. Interim medical history since our last visit reviewed. Allergies and medications reviewed and updated.  Review of Systems  Constitutional: Negative.   Respiratory: Negative.   Cardiovascular: Negative.   Gastrointestinal: Negative.   Musculoskeletal: Negative.     Neurological: Negative.   Psychiatric/Behavioral: Negative.     Per HPI unless specifically indicated above     Objective:    BP (!) 156/92 (BP Location: Left Arm, Patient Position: Sitting, Cuff Size: Normal)    Pulse (!) 55    Temp 97.6 F (36.4 C) (Oral)    Ht 5' 8.5" (1.74 m)    Wt 173 lb 9.6 oz (78.7 kg)    SpO2 99%    BMI 26.01 kg/m   Wt Readings from Last 3 Encounters:  11/13/19 173 lb 9.6 oz (78.7 kg)  10/12/19 173 lb (78.5 kg)  08/24/19 173 lb (78.5 kg)    Physical Exam Vitals and nursing note reviewed.  Constitutional:      General: He is not in acute distress.    Appearance: Normal appearance. He is not ill-appearing, toxic-appearing or diaphoretic.  HENT:     Head: Normocephalic and atraumatic.     Right Ear: External ear normal.     Left Ear: External ear normal.     Nose: Nose normal.     Mouth/Throat:     Mouth: Mucous membranes are moist.     Pharynx: Oropharynx is clear.  Eyes:     General: No scleral icterus.       Right eye: No discharge.        Left eye: No discharge.     Extraocular Movements: Extraocular movements intact.     Conjunctiva/sclera: Conjunctivae normal.  Pupils: Pupils are equal, round, and reactive to light.  Cardiovascular:     Rate and Rhythm: Normal rate and regular rhythm.     Pulses: Normal pulses.     Heart sounds: Normal heart sounds. No murmur. No friction rub. No gallop.   Pulmonary:     Effort: Pulmonary effort is normal. No respiratory distress.     Breath sounds: Normal breath sounds. No stridor. No wheezing, rhonchi or rales.  Chest:     Chest wall: No tenderness.  Musculoskeletal:        General: Normal range of motion.     Cervical back: Normal range of motion and neck supple.  Skin:    General: Skin is warm and dry.     Capillary Refill: Capillary refill takes less than 2 seconds.     Coloration: Skin is not jaundiced or pale.     Findings: No bruising, erythema, lesion or rash.  Neurological:     General:  No focal deficit present.     Mental Status: He is alert and oriented to person, place, and time. Mental status is at baseline.  Psychiatric:        Mood and Affect: Mood normal.        Behavior: Behavior normal.        Thought Content: Thought content normal.        Judgment: Judgment normal.     Acute Interface, Incoming Rad Results - 11/06/2019  9:30 AM EDT TECHNIQUE: Multiplanar, multisequence MR imaging of the lumbar spine was obtained without contrast  COMPARISON: None  INDICATION: Lumbago with sciatica, right side  FINDINGS:  . The conus medullaris appears normal, terminating at the L1 level. No abnormal signal demonstrated within the visualized distal spinal cord. Cauda equina unremarkable.  . Vertebral body heights maintained. Marrow signal within normal limits for age.  Alignment anatomic.  . Modic 1 or 2 endplate changes:None . Vertebral numbering: Standard  . The visualized lower thoracic spine demonstrates no disc herniation, significant central canal or foraminal stenosis.  . T12-L1: No disc herniation, significant central canal or foraminal stenosis. Marland Kitchen L1-L2: No disc herniation, significant central canal or foraminal stenosis. Marland Kitchen L2-L3: No disc herniation, significant central canal or foraminal stenosis. Marland Kitchen L3-L4: No disc herniation, significant central canal or foraminal stenosis. Marland Kitchen L4-L5: No disc herniation, significant central canal or foraminal stenosis. Marland Kitchen L5-S1: No disc herniation, significant central canal or foraminal stenosis.  Marland Kitchen Apparent bone graft harvest site in the posterior right iliac crest, with a sclerotic exostosis arising from this area. Clinical correlation and comparison with any prior imaging is recommended.   IMPRESSION: Apparent bone graft harvest site or other bony defect in the posterior right iliac crest, with a sclerotic exostosis arising from this area. Clinical correlation and comparison with any prior imaging is recommended. This is  incompletely imaged.  No acute findings in the lumbar spine  Results for orders placed or performed in visit on 10/12/19  SAR CoV2 Serology (COVID 19)AB(IGG)IA  Result Value Ref Range   DiaSorin SARS-CoV-2 Ab, IgG Negative Negative      Assessment & Plan:   Problem List Items Addressed This Visit      Other   Hyperlipidemia - Primary    Under good control on current regimen. Continue current regimen. Continue to monitor. Call with any concerns. Refills given. Labs drawn today.       Relevant Orders   Comprehensive metabolic panel   Lipid Panel w/o Chol/HDL Ratio  Other Visit Diagnoses    Elevated BP without diagnosis of hypertension       Likely due to pain and increased NSAID use with his back. Work on [employer] and recheck 1-2 months. Call with any concerns.   Acute right-sided low back pain with right-sided sciatica       Will continue to work to get him into PT. Continue exercises. Call with any concerns. Continue to monitor.    Relevant Medications   meloxicam (MOBIC) 15 MG tablet       Follow up plan: Return 1-2 months.

## 2019-11-13 NOTE — Assessment & Plan Note (Signed)
Under good control on current regimen. Continue current regimen. Continue to monitor. Call with any concerns. Refills given. Labs drawn today.   

## 2019-11-14 LAB — COMPREHENSIVE METABOLIC PANEL
ALT: 29 IU/L (ref 0–44)
AST: 31 IU/L (ref 0–40)
Albumin/Globulin Ratio: 2.4 — ABNORMAL HIGH (ref 1.2–2.2)
Albumin: 4.5 g/dL (ref 4.0–5.0)
Alkaline Phosphatase: 56 IU/L (ref 48–121)
BUN/Creatinine Ratio: 18 (ref 9–20)
BUN: 17 mg/dL (ref 6–20)
Bilirubin Total: 0.4 mg/dL (ref 0.0–1.2)
CO2: 24 mmol/L (ref 20–29)
Calcium: 9.7 mg/dL (ref 8.7–10.2)
Chloride: 101 mmol/L (ref 96–106)
Creatinine, Ser: 0.95 mg/dL (ref 0.76–1.27)
GFR calc Af Amer: 117 mL/min/{1.73_m2} (ref >59)
GFR calc non Af Amer: 101 mL/min/{1.73_m2} (ref >59)
Globulin, Total: 1.9 g/dL (ref 1.5–4.5)
Glucose: 94 mg/dL (ref 65–99)
Potassium: 4.4 mmol/L (ref 3.5–5.2)
Sodium: 140 mmol/L (ref 134–144)
Total Protein: 6.4 g/dL (ref 6.0–8.5)

## 2019-11-14 LAB — LIPID PANEL W/O CHOL/HDL RATIO
Cholesterol, Total: 216 mg/dL — ABNORMAL HIGH (ref 100–199)
HDL: 56 mg/dL (ref >39)
LDL Chol Calc (NIH): 140 mg/dL — ABNORMAL HIGH (ref 0–99)
Triglycerides: 111 mg/dL (ref 0–149)
VLDL Cholesterol Cal: 20 mg/dL (ref 5–40)

## 2019-11-15 ENCOUNTER — Other Ambulatory Visit: Payer: Self-pay | Admitting: Family Medicine

## 2019-11-15 ENCOUNTER — Encounter: Payer: Self-pay | Admitting: Family Medicine

## 2019-11-15 MED ORDER — ATORVASTATIN CALCIUM 40 MG PO TABS: 40.0000 mg | ORAL_TABLET | Freq: Every day | ORAL | 1 refills | Status: DC

## 2019-11-26 ENCOUNTER — Telehealth: Payer: Self-pay | Admitting: Family Medicine

## 2019-11-26 NOTE — Telephone Encounter (Signed)
Patient notified

## 2019-11-26 NOTE — Telephone Encounter (Signed)
It could be. Have him stop it for a week and let me know if it goes away.

## 2019-11-26 NOTE — Telephone Encounter (Signed)
Copied from CRM [id number]. Topic: General - Inquiry >> Nov 26, 2019 11:40 AM Daphine Deutscher D wrote: Reason for CRM: Pt called saying Dr. Laural Benes changes his Cholestrol medication and since then he has been experiencing bloating, muscle pain ect.  He wants to know if the new medication could be causing this  CB#  [phone]

## 2019-11-26 NOTE — Telephone Encounter (Signed)
Routing to provider to advise.  

## 2019-12-08 ENCOUNTER — Other Ambulatory Visit: Payer: Self-pay | Admitting: Family Medicine

## 2019-12-08 NOTE — Telephone Encounter (Signed)
Requested Prescriptions  Pending Prescriptions Disp Refills  . PARoxetine (PAXIL) 40 MG tablet [Pharmacy Med Name: PARoxetine HCl 40 MG Oral Tablet] 90 tablet 1    Sig: Take 1 tablet by mouth once daily     Psychiatry:  Antidepressants - SSRI Passed - 12/08/2019  9:16 AM      Passed - Completed PHQ-2 or PHQ-9 in the last 360 days.      Passed - Valid encounter within last 6 months    Recent Outpatient Visits          3 weeks ago Mixed hyperlipidemia   [facility] [place], Megan P, DO   1 month ago Acute right-sided low back pain with right-sided sciatica   [facility] [place], Herminie, DO   3 months ago Mixed hyperlipidemia   [facility] Particia Nearing, [place]   9 months ago Routine general medical examination at a health care facility   [facility] [place] P, DO   1 year ago Angioedema of lips, initial encounter   [facility] Dorcas Carrow, DO      Future Appointments            In 3 weeks Laural Benes, Oralia Rud, DO [facility]   In 3 months Laural Benes, Oralia Rud, DO [facility]

## 2019-12-31 ENCOUNTER — Ambulatory Visit: Payer: BC Managed Care – PPO | Admitting: Family Medicine

## 2020-01-04 ENCOUNTER — Other Ambulatory Visit: Payer: Self-pay

## 2020-01-04 ENCOUNTER — Ambulatory Visit (INDEPENDENT_AMBULATORY_CARE_PROVIDER_SITE_OTHER): Payer: BC Managed Care – PPO | Admitting: Family Medicine

## 2020-01-04 ENCOUNTER — Encounter: Payer: Self-pay | Admitting: Family Medicine

## 2020-01-04 VITALS — BP 146/84 | HR 59 | Temp 98.8°F | Wt 177.0 lb

## 2020-01-04 DIAGNOSIS — R030 Elevated blood-pressure reading, without diagnosis of hypertension: Secondary | ICD-10-CM

## 2020-01-04 DIAGNOSIS — E782 Mixed hyperlipidemia: Secondary | ICD-10-CM

## 2020-01-04 DIAGNOSIS — M5441 Lumbago with sciatica, right side: Secondary | ICD-10-CM

## 2020-01-04 MED ORDER — ROSUVASTATIN CALCIUM 20 MG PO TABS
20.0000 mg | ORAL_TABLET | Freq: Every day | ORAL | 1 refills | Status: DC
Start: 2020-01-04 — End: 2020-04-24

## 2020-01-04 MED ORDER — IBUPROFEN 800 MG PO TABS: 800.0000 mg | ORAL_TABLET | Freq: Three times a day (TID) | ORAL | 0 refills | Status: DC | PRN

## 2020-01-04 NOTE — Progress Notes (Signed)
BP (!) 146/84 (BP Location: Left Arm, Cuff Size: Normal)   Pulse 59   Temp 98.8 F (37.1 C) (Oral)   Wt 177 lb (80.3 kg)   SpO2 96%   BMI 26.52 kg/m    Subjective:    Patient ID: Jacob Key, male    DOB: [birth year], 39 y.o.   MRN: [id number]  HPI: Jacob Key is a 39 y.o. male  Chief Complaint  Patient presents with  . Hypertension  . Hyperlipidemia    pt states he has stopped taking the atorvastatin due to side effects, abdominal discomfort   HYPERTENSION / HYPERLIPIDEMIA Satisfied with current treatment? yes Duration of hypertension: unknown BP monitoring frequency: rarely Past BP meds: none Duration of hyperlipidemia: chronic Cholesterol medication side effects: yes- abdominal pain on atorvastatin Cholesterol supplements: none Past cholesterol medications: atorvastatin Medication compliance: good compliance Aspirin: no Recent stressors: yes Recurrent headaches: no Visual changes: no Palpitations: no Dyspnea: no Chest pain: no Lower extremity edema: no Dizzy/lightheaded: no  Back is doing OK. Was not able to do PT due to cost. Still having pain down his R leg and aching and soreness in his back. He soul like to talk to someone about a cortisone shot.  Relevant past medical, surgical, family and social history reviewed and updated as indicated. Interim medical history since our last visit reviewed. Allergies and medications reviewed and updated.  Review of Systems  Constitutional: Negative.   Respiratory: Negative.   Cardiovascular: Negative.   Gastrointestinal: Negative.   Musculoskeletal: Positive for back pain and myalgias. Negative for arthralgias, gait problem, joint swelling, neck pain and neck stiffness.  Skin: Negative.   Neurological: Negative.   Psychiatric/Behavioral: Negative.     Per HPI unless specifically indicated above     Objective:    BP (!) 146/84 (BP Location: Left Arm, Cuff Size: Normal)   Pulse 59   Temp 98.8 F (37.1  C) (Oral)   Wt 177 lb (80.3 kg)   SpO2 96%   BMI 26.52 kg/m   Wt Readings from Last 3 Encounters:  01/04/20 177 lb (80.3 kg)  11/13/19 173 lb 9.6 oz (78.7 kg)  10/12/19 173 lb (78.5 kg)    Physical Exam Vitals and nursing note reviewed.  Constitutional:      General: He is not in acute distress.    Appearance: Normal appearance. He is not ill-appearing, toxic-appearing or diaphoretic.  HENT:     Head: Normocephalic and atraumatic.     Right Ear: External ear normal.     Left Ear: External ear normal.     Nose: Nose normal.     Mouth/Throat:     Mouth: Mucous membranes are moist.     Pharynx: Oropharynx is clear.  Eyes:     General: No scleral icterus.       Right eye: No discharge.        Left eye: No discharge.     Extraocular Movements: Extraocular movements intact.     Conjunctiva/sclera: Conjunctivae normal.     Pupils: Pupils are equal, round, and reactive to light.  Cardiovascular:     Rate and Rhythm: Normal rate and regular rhythm.     Pulses: Normal pulses.     Heart sounds: Normal heart sounds. No murmur heard.  No friction rub. No gallop.   Pulmonary:     Effort: Pulmonary effort is normal. No respiratory distress.     Breath sounds: Normal breath sounds. No stridor. No wheezing, rhonchi or rales.  Chest:  Chest wall: No tenderness.  Musculoskeletal:        General: Normal range of motion.     Cervical back: Normal range of motion and neck supple.  Skin:    General: Skin is warm and dry.     Capillary Refill: Capillary refill takes less than 2 seconds.     Coloration: Skin is not jaundiced or pale.     Findings: No bruising, erythema, lesion or rash.  Neurological:     General: No focal deficit present.     Mental Status: He is alert and oriented to person, place, and time. Mental status is at baseline.  Psychiatric:        Mood and Affect: Mood normal.        Behavior: Behavior normal.        Thought Content: Thought content normal.         Judgment: Judgment normal.     Results for orders placed or performed in visit on 11/13/19  Comprehensive metabolic panel  Result Value Ref Range   Glucose 94 65 - 99 mg/dL   BUN 17 6 - 20 mg/dL   Creatinine, Ser 7.86 0.76 - 1.27 mg/dL   GFR calc non Af Amer 101 >59 mL/min/1.73   GFR calc Af Amer 117 >59 mL/min/1.73   BUN/Creatinine Ratio 18 9 - 20   Sodium 140 134 - 144 mmol/L   Potassium 4.4 3.5 - 5.2 mmol/L   Chloride 101 96 - 106 mmol/L   CO2 24 20 - 29 mmol/L   Calcium 9.7 8.7 - 10.2 mg/dL   Total Protein 6.4 6.0 - 8.5 g/dL   Albumin 4.5 4.0 - 5.0 g/dL   Globulin, Total 1.9 1.5 - 4.5 g/dL   Albumin/Globulin Ratio 2.4 (H) 1.2 - 2.2   Bilirubin Total 0.4 0.0 - 1.2 mg/dL   Alkaline Phosphatase 56 48 - 121 IU/L   AST 31 0 - 40 IU/L   ALT 29 0 - 44 IU/L  Lipid Panel w/o Chol/HDL Ratio  Result Value Ref Range   Cholesterol, Total 216 (H) 100 - 199 mg/dL   Triglycerides 767 0 - 149 mg/dL   HDL 56 >20 mg/dL   VLDL Cholesterol Cal 20 5 - 40 mg/dL   LDL Chol Calc (NIH) 947 (H) 0 - 99 mg/dL      Assessment & Plan:   Problem List Items Addressed This Visit      Other   Hyperlipidemia    Unable to tolerate atorvastatin. Will change to crestor and recheck 3 months. Call with any concerns.       Relevant Medications   rosuvastatin (CRESTOR) 20 MG tablet    Other Visit Diagnoses    Elevated BP without diagnosis of hypertension    -  Primary   Would like to hold on meds for right now. Continue DASH diet and exercise. Call with any concerns.    Acute right-sided low back pain with right-sided sciatica       Would like to see ortho. Has not been able to get into PT due to cost. Continue to monitor.    Relevant Medications   ibuprofen (ADVIL) 800 MG tablet   Other Relevant Orders   Ambulatory referral to Orthopedic Surgery       Follow up plan: Return in about 3 months (around 04/05/2020).

## 2020-01-04 NOTE — Assessment & Plan Note (Signed)
Unable to tolerate atorvastatin. Will change to crestor and recheck 3 months. Call with any concerns.

## 2020-03-13 ENCOUNTER — Encounter: Payer: BC Managed Care – PPO | Admitting: Family Medicine

## 2020-04-24 ENCOUNTER — Ambulatory Visit (INDEPENDENT_AMBULATORY_CARE_PROVIDER_SITE_OTHER): Payer: BC Managed Care – PPO | Admitting: Family Medicine

## 2020-04-24 ENCOUNTER — Encounter: Payer: Self-pay | Admitting: Family Medicine

## 2020-04-24 ENCOUNTER — Other Ambulatory Visit: Payer: Self-pay

## 2020-04-24 VITALS — BP 141/80 | HR 66 | Temp 98.6°F | Ht 69.0 in | Wt 181.0 lb

## 2020-04-24 DIAGNOSIS — E782 Mixed hyperlipidemia: Secondary | ICD-10-CM | POA: Diagnosis not present

## 2020-04-24 DIAGNOSIS — R490 Dysphonia: Secondary | ICD-10-CM

## 2020-04-24 DIAGNOSIS — Z1152 Encounter for screening for COVID-19: Secondary | ICD-10-CM

## 2020-04-24 DIAGNOSIS — L03811 Cellulitis of head [any part, except face]: Secondary | ICD-10-CM | POA: Diagnosis not present

## 2020-04-24 DIAGNOSIS — F3342 Major depressive disorder, recurrent, in full remission: Secondary | ICD-10-CM | POA: Diagnosis not present

## 2020-04-24 DIAGNOSIS — Z0000 Encounter for general adult medical examination without abnormal findings: Secondary | ICD-10-CM | POA: Diagnosis not present

## 2020-04-24 DIAGNOSIS — Z136 Encounter for screening for cardiovascular disorders: Secondary | ICD-10-CM | POA: Diagnosis not present

## 2020-04-24 LAB — MICROALBUMIN, URINE WAIVED
Creatinine, Urine Waived: 300 mg/dL (ref 10–300)
Microalb, Ur Waived: 30 mg/L — ABNORMAL HIGH (ref 0–19)
Microalb/Creat Ratio: <30 mg/g (ref <30)

## 2020-04-24 LAB — URINALYSIS, ROUTINE W REFLEX MICROSCOPIC
Bilirubin, UA: NEGATIVE
Glucose, UA: NEGATIVE
Ketones, UA: NEGATIVE
Leukocytes,UA: NEGATIVE
Nitrite, UA: NEGATIVE
Protein,UA: NEGATIVE
RBC, UA: NEGATIVE
Specific Gravity, UA: 1.025 (ref 1.005–1.030)
Urobilinogen, Ur: 1 mg/dL (ref 0.2–1.0)
pH, UA: 6.5 (ref 5.0–7.5)

## 2020-04-24 MED ORDER — FLUTICASONE PROPIONATE 50 MCG/ACT NA SUSP: 2.0000 | Freq: Every day | NASAL | 6 refills | Status: DC

## 2020-04-24 MED ORDER — PAROXETINE HCL 40 MG PO TABS
40.0000 mg | ORAL_TABLET | Freq: Every day | ORAL | 1 refills | Status: DC
Start: 2020-04-24 — End: 2020-10-31

## 2020-04-24 MED ORDER — DOXYCYCLINE HYCLATE 100 MG PO TABS: 100.0000 mg | ORAL_TABLET | Freq: Two times a day (BID) | ORAL | 0 refills | Status: DC

## 2020-04-24 MED ORDER — ROSUVASTATIN CALCIUM 20 MG PO TABS
20.0000 mg | ORAL_TABLET | Freq: Every day | ORAL | 1 refills | Status: DC
Start: 2020-04-24 — End: 2020-10-31

## 2020-04-24 MED ORDER — IBUPROFEN 800 MG PO TABS
800.0000 mg | ORAL_TABLET | Freq: Three times a day (TID) | ORAL | 1 refills | Status: DC | PRN
Start: 2020-04-24 — End: 2021-02-25

## 2020-04-24 NOTE — Assessment & Plan Note (Signed)
Under good control on current regimen. Continue current regimen. Continue to monitor. Call with any concerns. Refills given. Labs drawn today.   

## 2020-04-24 NOTE — Patient Instructions (Signed)

## 2020-04-24 NOTE — Progress Notes (Signed)
BP (!) 141/80   Pulse 66   Temp 98.6 F (37 C) (Oral)   Ht 5\' 9"  (1.753 m)   Wt 181 lb (82.1 kg)   SpO2 100%   BMI 26.73 kg/m    Subjective:    Patient ID: Jacob Key, male    DOB: 06/08/1981, 39 y.o.   MRN: [id number]  HPI: Jacob Ladendrew Ivy is a 39 y.o. male presenting on 04/24/2020 for comprehensive medical examination. Current medical complaints include:  SKIN INFECTION Duration: about a week Location: back of his head on the L History of trauma in area: yes- shaving Pain: yes Quality: aching Severity: mildH3 Redness: yes Swelling: yes Oozing: yes Pus: yes Fevers: no Nausea/vomiting: no Status: better Treatments attempted:warm compresses  Tetanus: UTD  HYPERLIPIDEMIA Hyperlipidemia status: excellent compliance Satisfied with current treatment?  yes Side effects:  no Medication compliance: excellent compliance Past cholesterol meds: crestor Supplements: none Aspirin:  no The ASCVD Risk score Denman George(Goff DC Jr., et al., 2013) failed to calculate for the following reasons:   The 2013 ASCVD risk score is only valid for ages 8540 to 7679 Chest pain:  no Coronary artery disease:  no  DEPRESSION Mood status: controlled Satisfied with current treatment?: yes Symptom severity: mild  Duration of current treatment : chronic Side effects: no Medication compliance: excellent compliance Psychotherapy/counseling: no  Previous psychiatric medications: paxil Depressed mood: no Anxious mood: no Anhedonia: no Significant weight loss or gain: no Insomnia: no  Fatigue: no Feelings of worthlessness or guilt: no Impaired concentration/indecisiveness: no Suicidal ideations: no Hopelessness: no Crying spells: no [facility] screen [facility] 04/24/2020 04/24/2020 01/04/2020 08/24/2019 03/12/2019  Decreased Interest 0 0 0 0 0  Down, Depressed, Hopeless 0 0 0 0 0  PHQ - 2 Score 0 0 0 0 0  Altered sleeping 0 - 1 1 1   Tired, decreased energy 0 - 0 0 0  Change in appetite 0 - 0 0 0    Feeling bad or failure about yourself  0 - 0 0 0  Trouble concentrating 0 - 1 0 0  Moving slowly or fidgety/restless 0 - 0 1 0  Suicidal thoughts 0 - 0 0 0  PHQ-9 Score 0 - 2 2 1   Difficult doing work/chores Not difficult at all - - - Not difficult at all    Interim Problems from his last visit: no  Depression Screen done today and results listed below:  [facility] 04/24/2020 04/24/2020 01/04/2020 08/24/2019 03/12/2019  Decreased Interest 0 0 0 0 0  Down, Depressed, Hopeless 0 0 0 0 0  PHQ - 2 Score 0 0 0 0 0  Altered sleeping 0 - 1 1 1   Tired, decreased energy 0 - 0 0 0  Change in appetite 0 - 0 0 0  Feeling bad or failure about yourself  0 - 0 0 0  Trouble concentrating 0 - 1 0 0  Moving slowly or fidgety/restless 0 - 0 1 0  Suicidal thoughts 0 - 0 0 0  PHQ-9 Score 0 - 2 2 1   Difficult doing work/chores Not difficult at all - - - Not difficult at all     Past Medical History:  Past Medical History:  Diagnosis Date  . Depression     Surgical History:  Past Surgical History:  Procedure Laterality Date  . FACIAL RECONSTRUCTION SURGERY     Lower face  . TONSILLECTOMY      Medications:  No current outpatient medications on file prior to  visit.   No current facility-administered medications on file prior to visit.    Allergies:  No Known Allergies  Social History:  Social History   Socioeconomic History  . Marital status: Single    Spouse name: Not on file  . Number of children: Not on file  . Years of education: Not on file  . Highest education level: Not on file  Occupational History  . Not on file  Tobacco Use  . Smoking status: Current Every Day Smoker    Packs/day: 0.50    Years: 0.50    Pack years: 0.25    Types: Cigarettes, E-cigarettes    Start date: 01/18/2015  . Smokeless tobacco: Never Used  Vaping Use  . Vaping Use: Never used  Substance and Sexual Activity  . Alcohol use: Yes    Alcohol/week: 20.0 standard drinks    Types:  20 Cans of beer per week    Comment: moderate  . Drug use: No  . Sexual activity: Yes  Other Topics Concern  . Not on file  Social History Narrative  . Not on file   Social Determinants of Health   Financial Resource Strain:   . Difficulty of Paying Living Expenses: Not on file  Food Insecurity:   . Worried About Programme researcher, broadcasting/film/video in the Last Year: Not on file  . Ran Out of Food in the Last Year: Not on file  Transportation Needs:   . Lack of Transportation (Medical): Not on file  . Lack of Transportation (Non-Medical): Not on file  Physical Activity:   . Days of Exercise per Week: Not on file  . Minutes of Exercise per Session: Not on file  Stress:   . Feeling of Stress : Not on file  Social Connections:   . Frequency of Communication with Friends and Family: Not on file  . Frequency of Social Gatherings with Friends and Family: Not on file  . Attends Religious Services: Not on file  . Active Member of Clubs or Organizations: Not on file  . Attends Banker Meetings: Not on file  . Marital Status: Not on file  Intimate Partner Violence:   . Fear of Current or Ex-Partner: Not on file  . Emotionally Abused: Not on file  . Physically Abused: Not on file  . Sexually Abused: Not on file   Social History   Tobacco Use  Smoking Status Current Every Day Smoker  . Packs/day: 0.50  . Years: 0.50  . Pack years: 0.25  . Types: Cigarettes, E-cigarettes  . Start date: 01/18/2015  Smokeless Tobacco Never Used   Social History   Substance and Sexual Activity  Alcohol Use Yes  . Alcohol/week: 20.0 standard drinks  . Types: 20 Cans of beer per week   Comment: moderate    Family History:  Family History  Problem Relation Age of Onset  . Hyperlipidemia Mother   . Hyperlipidemia Father   . Alcohol abuse Maternal Grandmother   . Heart disease Maternal Grandmother   . Heart disease Maternal Grandfather   . Alzheimer's disease Paternal Grandfather     Past  medical history, surgical history, medications, allergies, family history and social history reviewed with patient today and changes made to appropriate areas of the chart.   Review of Systems  Constitutional: Negative.   HENT: Negative.        Hoarseness  Eyes: Negative.   Respiratory: Negative.   Cardiovascular: Negative.   Gastrointestinal: Positive for heartburn. Negative for abdominal  pain, blood in stool, constipation, diarrhea, melena, nausea and vomiting.  Genitourinary: Negative.   Musculoskeletal: Negative.   Skin: Negative.   Neurological: Negative.   Endo/Heme/Allergies: Negative.   Psychiatric/Behavioral: Negative.     All other ROS negative except what is listed above and in the HPI.      Objective:    BP (!) 141/80   Pulse 66   Temp 98.6 F (37 C) (Oral)   Ht 5\' 9"  (1.753 m)   Wt 181 lb (82.1 kg)   SpO2 100%   BMI 26.73 kg/m   Wt Readings from Last 3 Encounters:  04/24/20 181 lb (82.1 kg)  01/04/20 177 lb (80.3 kg)  11/13/19 173 lb 9.6 oz (78.7 kg)    Physical Exam Vitals and nursing note reviewed.  Constitutional:      General: He is not in acute distress.    Appearance: Normal appearance. He is normal weight. He is not ill-appearing, toxic-appearing or diaphoretic.  HENT:     Head: Normocephalic and atraumatic.     Right Ear: Tympanic membrane, ear canal and external ear normal. There is no impacted cerumen.     Left Ear: Tympanic membrane, ear canal and external ear normal. There is no impacted cerumen.     Nose: Nose normal. No congestion or rhinorrhea.     Mouth/Throat:     Mouth: Mucous membranes are moist.     Pharynx: Oropharynx is clear. No oropharyngeal exudate or posterior oropharyngeal erythema.  Eyes:     General: No scleral icterus.       Right eye: No discharge.        Left eye: No discharge.     Extraocular Movements: Extraocular movements intact.     Conjunctiva/sclera: Conjunctivae normal.     Pupils: Pupils are equal, round,  and reactive to light.  Neck:     Vascular: No carotid bruit.  Cardiovascular:     Rate and Rhythm: Normal rate and regular rhythm.     Pulses: Normal pulses.     Heart sounds: No murmur heard.  No friction rub. No gallop.   Pulmonary:     Effort: Pulmonary effort is normal. No respiratory distress.     Breath sounds: Normal breath sounds. No stridor. No wheezing, rhonchi or rales.  Chest:     Chest wall: No tenderness.  Abdominal:     General: Abdomen is flat. Bowel sounds are normal. There is no distension.     Palpations: Abdomen is soft. There is no mass.     Tenderness: There is no abdominal tenderness. There is no right CVA tenderness, left CVA tenderness, guarding or rebound.     Hernia: No hernia is present.  Genitourinary:    Comments: Genital exam deferred with shared decision making Musculoskeletal:        General: No swelling, tenderness, deformity or signs of injury.     Cervical back: Normal range of motion and neck supple. No rigidity. No muscular tenderness.     Right lower leg: No edema.     Left lower leg: No edema.  Lymphadenopathy:     Cervical: No cervical adenopathy.  Skin:    General: Skin is warm and dry.     Capillary Refill: Capillary refill takes less than 2 seconds.     Coloration: Skin is not jaundiced or pale.     Findings: No bruising, erythema, lesion or rash.     Comments: 2 inch lump with breakage of the skin on L  side of the back of his head, + heat and tenderness   Neurological:     General: No focal deficit present.     Mental Status: He is alert and oriented to person, place, and time.     Cranial Nerves: No cranial nerve deficit.     Sensory: No sensory deficit.     Motor: No weakness.     Coordination: Coordination normal.     Gait: Gait normal.     Deep Tendon Reflexes: Reflexes normal.  Psychiatric:        Mood and Affect: Mood normal.        Behavior: Behavior normal.        Thought Content: Thought content normal.         Judgment: Judgment normal.     Results for orders placed or performed in visit on 11/13/19  Comprehensive metabolic panel  Result Value Ref Range   Glucose 94 65 - 99 mg/dL   BUN 17 6 - 20 mg/dL   Creatinine, Ser 5.69 0.76 - 1.27 mg/dL   GFR calc non Af Amer 101 >59 mL/min/1.73   GFR calc Af Amer 117 >59 mL/min/1.73   BUN/Creatinine Ratio 18 9 - 20   Sodium 140 134 - 144 mmol/L   Potassium 4.4 3.5 - 5.2 mmol/L   Chloride 101 96 - 106 mmol/L   CO2 24 20 - 29 mmol/L   Calcium 9.7 8.7 - 10.2 mg/dL   Total Protein 6.4 6.0 - 8.5 g/dL   Albumin 4.5 4.0 - 5.0 g/dL   Globulin, Total 1.9 1.5 - 4.5 g/dL   Albumin/Globulin Ratio 2.4 (H) 1.2 - 2.2   Bilirubin Total 0.4 0.0 - 1.2 mg/dL   Alkaline Phosphatase 56 48 - 121 IU/L   AST 31 0 - 40 IU/L   ALT 29 0 - 44 IU/L  Lipid Panel w/o Chol/HDL Ratio  Result Value Ref Range   Cholesterol, Total 216 (H) 100 - 199 mg/dL   Triglycerides 794 0 - 149 mg/dL   HDL 56 >80 mg/dL   VLDL Cholesterol Cal 20 5 - 40 mg/dL   LDL Chol Calc (NIH) 165 (H) 0 - 99 mg/dL      Assessment & Plan:   Problem List Items Addressed This Visit      Other   Depression    Under good control on current regimen. Continue current regimen. Continue to monitor. Call with any concerns. Refills given. Labs drawn today.       Relevant Medications   PARoxetine (PAXIL) 40 MG tablet   Hyperlipidemia    Under good control on current regimen. Continue current regimen. Continue to monitor. Call with any concerns. Refills given. Labs drawn today.       Relevant Medications   rosuvastatin (CRESTOR) 20 MG tablet    Other Visit Diagnoses    Routine general medical examination at a health care facility    -  Primary   Vaccines declined. Screening labs checked today. Continue diet and exercise. Call with any concerns. Continue to monitor.    Relevant Orders   CBC with Differential/Platelet   Comprehensive metabolic panel   Lipid Panel w/o Chol/HDL Ratio   Microalbumin,  Urine Waived   TSH   Urinalysis, Routine w reflex microscopic   Hepatitis C Antibody   Cellulitis of head (any part, except face)       Will treat with doxycycline. Call if not getting better or getting worse.    Encounter for screening  for COVID-19       Labs drawn today. Await results.    Relevant Orders   SAR CoV2 Serology (COVID 19)AB(IGG)IA   Hoarseness       Will start flonase. Call if not getting better and we'll get him into ENT.      LABORATORY TESTING:  Health maintenance labs ordered today as discussed above.    IMMUNIZATIONS:   - Tdap: Tetanus vaccination status reviewed: last tetanus booster within 10 years. - Influenza: Refused - Pneumovax: Refused - COVID: Refused  PATIENT COUNSELING:    Sexuality: Discussed sexually transmitted diseases, partner selection, use of condoms, avoidance of unintended pregnancy  and contraceptive alternatives.   Advised to avoid cigarette smoking.  I discussed with the patient that most people either abstain from alcohol or drink within safe limits (<=14/week and <=4 drinks/occasion for males, <=7/weeks and <= 3 drinks/occasion for females) and that the risk for alcohol disorders and other health effects rises proportionally with the number of drinks per week and how often a drinker exceeds daily limits.  Discussed cessation/primary prevention of drug use and availability of treatment for abuse.   Diet: Encouraged to adjust caloric intake to maintain  or achieve ideal body weight, to reduce intake of dietary saturated fat and total fat, to limit sodium intake by avoiding high sodium foods and not adding table salt, and to maintain adequate dietary potassium and calcium preferably from fresh fruits, vegetables, and low-fat dairy products.    stressed the importance of regular exercise  Injury prevention: Discussed safety belts, safety helmets, smoke detector, smoking near bedding or upholstery.   Dental health: Discussed importance of  regular tooth brushing, flossing, and dental visits.   Follow up plan: NEXT PREVENTATIVE PHYSICAL DUE IN 1 YEAR. Return in about 6 months (around 10/22/2020).

## 2020-04-25 LAB — SAR COV2 SEROLOGY (COVID19)AB(IGG),IA
SARS-CoV-2 Semi-Quant IgG Ab: <13 AU/mL (ref <13.0)
SARS-CoV-2 Spike Ab Interp: NEGATIVE

## 2020-04-25 LAB — CBC WITH DIFFERENTIAL/PLATELET
Basophils Absolute: 0 10*3/uL (ref 0.0–0.2)
Basos: 0 %
EOS (ABSOLUTE): 0.2 10*3/uL (ref 0.0–0.4)
Eos: 3 %
Hematocrit: 42.8 % (ref 37.5–51.0)
Hemoglobin: 14.8 g/dL (ref 13.0–17.7)
Immature Grans (Abs): 0 10*3/uL (ref 0.0–0.1)
Immature Granulocytes: 0 %
Lymphocytes Absolute: 1.3 10*3/uL (ref 0.7–3.1)
Lymphs: 26 %
MCH: 30.9 pg (ref 26.6–33.0)
MCHC: 34.6 g/dL (ref 31.5–35.7)
MCV: 89 fL (ref 79–97)
Monocytes Absolute: 0.3 10*3/uL (ref 0.1–0.9)
Monocytes: 6 %
Neutrophils Absolute: 3.2 10*3/uL (ref 1.4–7.0)
Neutrophils: 65 %
Platelets: 263 10*3/uL (ref 150–450)
RBC: 4.79 x10E6/uL (ref 4.14–5.80)
RDW: 11.8 % (ref 11.6–15.4)
WBC: 5 10*3/uL (ref 3.4–10.8)

## 2020-04-25 LAB — TSH: TSH: 0.963 u[IU]/mL (ref 0.450–4.500)

## 2020-04-25 LAB — COMPREHENSIVE METABOLIC PANEL
ALT: 27 IU/L (ref 0–44)
AST: 36 IU/L (ref 0–40)
Albumin/Globulin Ratio: 2.3 — ABNORMAL HIGH (ref 1.2–2.2)
Albumin: 4.8 g/dL (ref 4.0–5.0)
Alkaline Phosphatase: 74 IU/L (ref 44–121)
BUN/Creatinine Ratio: 20 (ref 9–20)
BUN: 17 mg/dL (ref 6–20)
Bilirubin Total: 0.4 mg/dL (ref 0.0–1.2)
CO2: 25 mmol/L (ref 20–29)
Calcium: 10.1 mg/dL (ref 8.7–10.2)
Chloride: 103 mmol/L (ref 96–106)
Creatinine, Ser: 0.83 mg/dL (ref 0.76–1.27)
GFR calc Af Amer: 128 mL/min/{1.73_m2} (ref >59)
GFR calc non Af Amer: 111 mL/min/{1.73_m2} (ref >59)
Globulin, Total: 2.1 g/dL (ref 1.5–4.5)
Glucose: 124 mg/dL — ABNORMAL HIGH (ref 65–99)
Potassium: 4.2 mmol/L (ref 3.5–5.2)
Sodium: 139 mmol/L (ref 134–144)
Total Protein: 6.9 g/dL (ref 6.0–8.5)

## 2020-04-25 LAB — LIPID PANEL W/O CHOL/HDL RATIO
Cholesterol, Total: 202 mg/dL — ABNORMAL HIGH (ref 100–199)
HDL: 60 mg/dL (ref >39)
LDL Chol Calc (NIH): 116 mg/dL — ABNORMAL HIGH (ref 0–99)
Triglycerides: 149 mg/dL (ref 0–149)
VLDL Cholesterol Cal: 26 mg/dL (ref 5–40)

## 2020-04-25 LAB — HEPATITIS C ANTIBODY: Hep C Virus Ab: <0.1 s/co ratio (ref 0.0–0.9)

## 2020-06-10 ENCOUNTER — Telehealth: Payer: Self-pay

## 2020-06-10 DIAGNOSIS — M545 Low back pain, unspecified: Secondary | ICD-10-CM

## 2020-06-10 NOTE — Telephone Encounter (Signed)
Copied from CRM [id number]. Topic: Referral - Request for Referral >> Jun 10, 2020  3:33 PM Dalphine Handing A wrote: Patient would like referral placed again to orthopedist due to previous referral expiring. Please advise  Can this be done or do they need an apt

## 2020-06-10 NOTE — Telephone Encounter (Signed)
Was placed in July, does he need to be seen?

## 2020-06-10 NOTE — Addendum Note (Signed)
Addended by: Dorcas Carrow on: 06/10/2020 03:46 PM   Modules accepted: Orders

## 2020-06-10 NOTE — Telephone Encounter (Signed)
Referral placed today

## 2020-06-11 ENCOUNTER — Telehealth: Payer: Self-pay

## 2020-06-11 NOTE — Telephone Encounter (Signed)
Copied from CRM [id number]. Topic: Referral - Status >> Jun 11, 2020 12:37 PM Marylen Ponto wrote: Reason for CRM: Pt requests referral be sent to [facility] for ortho in [place]  ph# [phone]

## 2020-06-11 NOTE — Telephone Encounter (Signed)
Referral coordinator notified, will send to [facility].

## 2020-06-30 ENCOUNTER — Encounter: Payer: Self-pay | Admitting: Family Medicine

## 2020-06-30 ENCOUNTER — Other Ambulatory Visit: Payer: Self-pay

## 2020-06-30 ENCOUNTER — Telehealth (INDEPENDENT_AMBULATORY_CARE_PROVIDER_SITE_OTHER): Payer: BC Managed Care – PPO | Admitting: Family Medicine

## 2020-06-30 VITALS — BP 148/97

## 2020-06-30 DIAGNOSIS — Z20822 Contact with and (suspected) exposure to covid-19: Secondary | ICD-10-CM

## 2020-06-30 MED ORDER — PREDNISONE 50 MG PO TABS: 50.0000 mg | ORAL_TABLET | Freq: Every day | ORAL | 0 refills | Status: DC

## 2020-06-30 NOTE — Progress Notes (Signed)
BP (!) 148/97    Subjective:    Patient ID: Jacob Key, male    DOB: 1981-05-17, 40 y.o.   MRN: [id number]  HPI: Jacob Key is a 40 y.o. male  Chief Complaint  Patient presents with  . URI    Pt states he has had chills, fatigue, and body aches for the last few days    UPPER RESPIRATORY TRACT INFECTION Duration: 4 days Worst symptom: body aches and cough Fever: no Cough: yes Shortness of breath: no Wheezing: no Chest pain: yes Chest tightness: no Chest congestion: yes Nasal congestion: yes Runny nose: no Post nasal drip: no Sneezing: no Sore throat: yes Swollen glands: no Sinus pressure: yes Headache: yes Face pain: no Toothache: no Ear pain: no  Ear pressure: no  Eyes red/itching:no Eye drainage/crusting: no  Vomiting: no Rash: no Fatigue: yes Sick contacts: no Strep contacts: no  Context: fluctuating Recurrent sinusitis: no Relief with OTC cold/cough medications: no  Treatments attempted: none   Relevant past medical, surgical, family and social history reviewed and updated as indicated. Interim medical history since our last visit reviewed. Allergies and medications reviewed and updated.  Review of Systems  Constitutional: Positive for chills, diaphoresis and fatigue. Negative for activity change, appetite change, fever and unexpected weight change.  HENT: Positive for congestion, postnasal drip and sinus pressure. Negative for dental problem, drooling, ear discharge, ear pain, facial swelling, hearing loss, mouth sores, nosebleeds, rhinorrhea, sinus pain, sneezing, sore throat, tinnitus, trouble swallowing and voice change.   Eyes: Negative.   Respiratory: Positive for cough. Negative for apnea, choking, chest tightness, shortness of breath, wheezing and stridor.   Cardiovascular: Negative.   Musculoskeletal: Positive for myalgias. Negative for arthralgias, back pain, gait problem, joint swelling, neck pain and neck stiffness.  Skin: Negative.    Psychiatric/Behavioral: Negative.     Per HPI unless specifically indicated above     Objective:    BP (!) 148/97   Wt Readings from Last 3 Encounters:  04/24/20 181 lb (82.1 kg)  01/04/20 177 lb (80.3 kg)  11/13/19 173 lb 9.6 oz (78.7 kg)    Physical Exam Vitals and nursing note reviewed.  Constitutional:      General: He is not in acute distress.    Appearance: Normal appearance. He is not ill-appearing, toxic-appearing or diaphoretic.  HENT:     Head: Normocephalic and atraumatic.     Right Ear: External ear normal.     Left Ear: External ear normal.     Nose: Nose normal.     Mouth/Throat:     Mouth: Mucous membranes are moist.     Pharynx: Oropharynx is clear.  Eyes:     General: No scleral icterus.       Right eye: No discharge.        Left eye: No discharge.     Conjunctiva/sclera: Conjunctivae normal.     Pupils: Pupils are equal, round, and reactive to light.  Pulmonary:     Effort: Pulmonary effort is normal. No respiratory distress.     Comments: Speaking in full sentences Musculoskeletal:        General: Normal range of motion.     Cervical back: Normal range of motion.  Skin:    Coloration: Skin is not jaundiced or pale.     Findings: No bruising, erythema, lesion or rash.  Neurological:     Mental Status: He is alert and oriented to person, place, and time. Mental status is at baseline.  Psychiatric:        Mood and Affect: Mood normal.        Behavior: Behavior normal.        Thought Content: Thought content normal.        Judgment: Judgment normal.     Results for orders placed or performed in visit on 04/24/20  CBC with Differential/Platelet  Result Value Ref Range   WBC 5.0 3.4 - 10.8 x10E3/uL   RBC 4.79 4.14 - 5.80 x10E6/uL   Hemoglobin 14.8 13.0 - 17.7 g/dL   Hematocrit 60.1 09.3 - 51.0 %   MCV 89 79 - 97 fL   MCH 30.9 26.6 - 33.0 pg   MCHC 34.6 31.5 - 35.7 g/dL   RDW 23.5 57.3 - 22.0 %   Platelets 263 150 - 450 x10E3/uL    Neutrophils 65 Not Estab. %   Lymphs 26 Not Estab. %   Monocytes 6 Not Estab. %   Eos 3 Not Estab. %   Basos 0 Not Estab. %   Neutrophils Absolute 3.2 1.4 - 7.0 x10E3/uL   Lymphocytes Absolute 1.3 0.7 - 3.1 x10E3/uL   Monocytes Absolute 0.3 0.1 - 0.9 x10E3/uL   EOS (ABSOLUTE) 0.2 0.0 - 0.4 x10E3/uL   Basophils Absolute 0.0 0.0 - 0.2 x10E3/uL   Immature Granulocytes 0 Not Estab. %   Immature Grans (Abs) 0.0 0.0 - 0.1 x10E3/uL  Comprehensive metabolic panel  Result Value Ref Range   Glucose 124 (H) 65 - 99 mg/dL   BUN 17 6 - 20 mg/dL   Creatinine, Ser 2.54 0.76 - 1.27 mg/dL   GFR calc non Af Amer 111 >59 mL/min/1.73   GFR calc Af Amer 128 >59 mL/min/1.73   BUN/Creatinine Ratio 20 9 - 20   Sodium 139 134 - 144 mmol/L   Potassium 4.2 3.5 - 5.2 mmol/L   Chloride 103 96 - 106 mmol/L   CO2 25 20 - 29 mmol/L   Calcium 10.1 8.7 - 10.2 mg/dL   Total Protein 6.9 6.0 - 8.5 g/dL   Albumin 4.8 4.0 - 5.0 g/dL   Globulin, Total 2.1 1.5 - 4.5 g/dL   Albumin/Globulin Ratio 2.3 (H) 1.2 - 2.2   Bilirubin Total 0.4 0.0 - 1.2 mg/dL   Alkaline Phosphatase 74 44 - 121 IU/L   AST 36 0 - 40 IU/L   ALT 27 0 - 44 IU/L  Lipid Panel w/o Chol/HDL Ratio  Result Value Ref Range   Cholesterol, Total 202 (H) 100 - 199 mg/dL   Triglycerides 270 0 - 149 mg/dL   HDL 60 >62 mg/dL   VLDL Cholesterol Cal 26 5 - 40 mg/dL   LDL Chol Calc (NIH) 376 (H) 0 - 99 mg/dL  Microalbumin, Urine Waived  Result Value Ref Range   Microalb, Ur Waived 30 (H) 0 - 19 mg/L   Creatinine, Urine Waived 300 10 - 300 mg/dL   Microalb/Creat Ratio <30 <30 mg/g  TSH  Result Value Ref Range   TSH 0.963 0.450 - 4.500 uIU/mL  Urinalysis, Routine w reflex microscopic  Result Value Ref Range   Specific Gravity, UA 1.025 1.005 - 1.030   pH, UA 6.5 5.0 - 7.5   Color, UA Yellow Yellow   Appearance Ur Clear Clear   Leukocytes,UA Negative Negative   Protein,UA Negative Negative/Trace   Glucose, UA Negative Negative   Ketones, UA Negative  Negative   RBC, UA Negative Negative   Bilirubin, UA Negative Negative   Urobilinogen, Ur 1.0 0.2 -  1.0 mg/dL   Nitrite, UA Negative Negative  Hepatitis C Antibody  Result Value Ref Range   Hep C Virus Ab <0.1 0.0 - 0.9 s/co ratio  SAR CoV2 Serology (COVID 19)AB(IGG)IA  Result Value Ref Range   SARS-CoV-2 Semi-Quant IgG Ab <13.0 Neg <13.0 AU/mL   SARS-CoV-2 Spike Ab Interp Negative       Assessment & Plan:   Problem List Items Addressed This Visit   None   Visit Diagnoses    Suspected COVID-19 virus infection    -  Primary   Will get him swabbed tomorrow. Self-quarantine until results are back. Will use prednisone for congestion. Call if not getting better or getting worse.    Relevant Orders   Novel Coronavirus, NAA ([facility])       Follow up plan: No follow-ups on file.   . This visit was completed via MyChart due to the restrictions of the COVID-19 pandemic. All issues as above were discussed and addressed. Physical exam was done as above through visual confirmation on MyChart. If it was felt that the patient should be evaluated in the office, they were directed there. The patient verbally consented to this visit. . Location of the patient: home . Location of the provider: home . Those involved with this call:  . Provider: Olevia Perches, DO . CMA: Wilhemena Durie, CMA . Front Desk/Registration: Harriet Pho  . Time spent on call: 15 minutes with patient face to face via video conference. More than 50% of this time was spent in counseling and coordination of care. 23 minutes total spent in review of patient's record and preparation of their chart.

## 2020-07-01 ENCOUNTER — Telehealth: Payer: Self-pay

## 2020-07-01 NOTE — Telephone Encounter (Signed)
Copied from CRM [id number]. Topic: General - Inquiry >> Jul 01, 2020  9:53 AM Jacob Key wrote: Reason for CRM: Patient called to let you know that he will not be coming in for a covid test today. He said he is going to take the medication and see if it works and then come in if he needs to. Please advise   FYI pt had virtual on 06/30/20 with PCP. S.Marisol Giambra

## 2020-07-03 ENCOUNTER — Telehealth: Payer: Self-pay

## 2020-07-03 NOTE — Telephone Encounter (Signed)
Copied from CRM [id number]. Topic: General - Other >> Jul 03, 2020  3:26 PM Gaetana Michaelis A wrote: Reason for CRM: Patient was recently prescribed  predniSONE (DELTASONE) and feels little to no improvement at the current time. Patient would like to be contacted by PCP to discuss possible alternatives and potentially scheduling another appointment to discuss further.

## 2020-07-03 NOTE — Telephone Encounter (Signed)
Can something else be sent in for the patient?

## 2020-07-04 NOTE — Telephone Encounter (Signed)
Without knowing if what he has is COVID there is nothing else we can send. He needs to get his COVID swab

## 2020-07-04 NOTE — Telephone Encounter (Signed)
Called and discussed with patient. He states that he does not feel comfortable having a covid swab. I explained that unfortunately antibiotics do not treat covid. Patient states that he will think about getting swabbed and will call us back if he decides to do so.

## 2020-07-08 ENCOUNTER — Encounter: Payer: Self-pay | Admitting: Family Medicine

## 2020-07-08 ENCOUNTER — Telehealth (INDEPENDENT_AMBULATORY_CARE_PROVIDER_SITE_OTHER): Payer: BC Managed Care – PPO | Admitting: Family Medicine

## 2020-07-08 VITALS — BP 132/61 | HR 64 | Wt 178.0 lb

## 2020-07-08 DIAGNOSIS — U071 COVID-19: Secondary | ICD-10-CM | POA: Diagnosis not present

## 2020-07-08 NOTE — Progress Notes (Signed)
Virtual Visit via Video Note  I connected with Billie Lade on 07/08/20 at  3:00 PM EST by a video enabled telemedicine application and verified that I am speaking with the correct person using two identifiers.  Location: Patient: home Provider: [facility]   I discussed the limitations of evaluation and management by telemedicine and the availability of in person appointments. The patient expressed understanding and agreed to proceed.  History of Present Illness:  UPPER RESPIRATORY TRACT INFECTION - symptom onset 06/27/20 - COVID+ 07/06/20 on at home test.  - body aches, fever, chills, sweating. Coughing, sore throat last few days.  Worst symptom: Fever: no Cough: yes Shortness of breath: no Wheezing: no Chest pain: yes, with cough Chest tightness: no Chest congestion: yes Nasal congestion: no Runny nose: no Sneezing: no Sore throat: yes Headache: yes Vomiting: no Sick contacts: yes Context: better Recurrent sinusitis: no Relief with OTC cold/cough medications: yes  Treatments attempted: cold/sinus, tylenol, ibuprofen Finished prednisone Thursday, didn't feel it helped.    Observations/Objective:  Well appearing, in NAD. Speaks in full sentences, no resp distress.  Assessment and Plan:  COVID-19 With mild sx, improving. Out of quarantine and [facility] referral window. Reviewed OTC symptom relief and emergency precautions.    I discussed the assessment and treatment plan with the patient. The patient was provided an opportunity to ask questions and all were answered. The patient agreed with the plan and demonstrated an understanding of the instructions.   The patient was advised to call back or seek an in-person evaluation if the symptoms worsen or if the condition fails to improve as anticipated.  I provided 12 minutes of non-face-to-face time during this encounter.   Caro Laroche, DO

## 2020-07-08 NOTE — Patient Instructions (Addendum)
It was great to see you!  Our plans for today:  - See below for self-isolation guidelines. You may end your quarantine once you are 10 days from symptom onset and fever free for 24 hours without use of tylenol or ibuprofen.  - You can continue to use over the counter cough/cold/sinus medication for your symptoms. - I recommend getting vaccinated against COVID once you are healed from your current infection. - Certainly, if you are having difficulties breathing or unable to keep down fluids, go to the Emergency Department.   Take care and seek immediate care sooner if you develop any concerns.   Dr. Linwood Dibbles     Person Under Monitoring Name: Jacob Key  Location: [address] [place] [address]   Infection Prevention Recommendations for Individuals Confirmed to have, or Being Evaluated for, 2019 Novel Coronavirus (COVID-19) Infection Who Receive Care at Home  Individuals who are confirmed to have, or are being evaluated for, COVID-19 should follow the prevention steps below until a healthcare provider or local or state health department says they can return to normal activities.  Stay home except to get medical care You should restrict activities outside your home, except for getting medical care. Do not go to work, school, or public areas, and do not use public transportation or taxis.  Call ahead before visiting your doctor Before your medical appointment, call the healthcare provider and tell them that you have, or are being evaluated for, COVID-19 infection. This will help the healthcare provider's office take steps to keep other people from getting infected. Ask your healthcare provider to call the local or state health department.  Monitor your symptoms Seek prompt medical attention if your illness is worsening (e.g., difficulty breathing). Before going to your medical appointment, call the healthcare provider and tell them that you have, or are being evaluated for,  COVID-19 infection. Ask your healthcare provider to call the local or state health department.  Wear a facemask You should wear a facemask that covers your nose and mouth when you are in the same room with other people and when you visit a healthcare provider. People who live with or visit you should also wear a facemask while they are in the same room with you.  Separate yourself from other people in your home As much as possible, you should stay in a different room from other people in your home. Also, you should use a separate bathroom, if available.  Avoid sharing household items You should not share dishes, drinking glasses, cups, eating utensils, towels, bedding, or other items with other people in your home. After using these items, you should wash them thoroughly with soap and water.  Cover your coughs and sneezes Cover your mouth and nose with a tissue when you cough or sneeze, or you can cough or sneeze into your sleeve. Throw used tissues in a lined trash can, and immediately wash your hands with soap and water for at least 20 seconds or use an alcohol-based hand rub.  Wash your [employer] your hands often and thoroughly with soap and water for at least 20 seconds. You can use an alcohol-based hand sanitizer if soap and water are not available and if your hands are not visibly dirty. Avoid touching your eyes, nose, and mouth with unwashed hands.   Prevention Steps for Caregivers and Household Members of Individuals Confirmed to have, or Being Evaluated for, COVID-19 Infection Being Cared for in the Home  If you live with, or  provide care at home for, a person confirmed to have, or being evaluated for, COVID-19 infection please follow these guidelines to prevent infection:  Follow healthcare provider's instructions Make sure that you understand and can help the patient follow any healthcare provider instructions for all care.  Provide for the patient's basic needs You  should help the patient with basic needs in the home and provide support for getting groceries, prescriptions, and other personal needs.  Monitor the patient's symptoms If they are getting sicker, call his or her medical provider and tell them that the patient has, or is being evaluated for, COVID-19 infection. This will help the healthcare provider's office take steps to keep other people from getting infected. Ask the healthcare provider to call the local or state health department.  Limit the number of people who have contact with the patient  If possible, have only one caregiver for the patient.  Other household members should stay in another home or place of residence. If this is not possible, they should stay  in another room, or be separated from the patient as much as possible. Use a separate bathroom, if available.  Restrict visitors who do not have an essential need to be in the home.  Keep older adults, very young children, and other sick people away from the patient Keep older adults, very young children, and those who have compromised immune systems or chronic health conditions away from the patient. This includes people with chronic heart, lung, or kidney conditions, diabetes, and cancer.  Ensure good ventilation Make sure that shared spaces in the home have good air flow, such as from an air conditioner or an opened window, weather permitting.  Wash your hands often  Wash your hands often and thoroughly with soap and water for at least 20 seconds. You can use an alcohol based hand sanitizer if soap and water are not available and if your hands are not visibly dirty.  Avoid touching your eyes, nose, and mouth with unwashed hands.  Use disposable paper towels to dry your hands. If not available, use dedicated cloth towels and replace them when they become wet.  Wear a facemask and gloves  Wear a disposable facemask at all times in the room and gloves when you touch or  have contact with the patient's blood, body fluids, and/or secretions or excretions, such as sweat, saliva, sputum, nasal mucus, vomit, urine, or feces.  Ensure the mask fits over your nose and mouth tightly, and do not touch it during use.  Throw out disposable facemasks and gloves after using them. Do not reuse.  Wash your hands immediately after removing your facemask and gloves.  If your personal clothing becomes contaminated, carefully remove clothing and launder. Wash your hands after handling contaminated clothing.  Place all used disposable facemasks, gloves, and other waste in a lined container before disposing them with other household waste.  Remove gloves and wash your hands immediately after handling these items.  Do not share dishes, glasses, or other household items with the patient  Avoid sharing household items. You should not share dishes, drinking glasses, cups, eating utensils, towels, bedding, or other items with a patient who is confirmed to have, or being evaluated for, COVID-19 infection.  After the person uses these items, you should wash them thoroughly with soap and water.  Wash laundry thoroughly  Immediately remove and wash clothes or bedding that have blood, body fluids, and/or secretions or excretions, such as sweat, saliva, sputum, nasal mucus, vomit,  urine, or feces, on them.  Wear gloves when handling laundry from the patient.  Read and follow directions on labels of laundry or clothing items and detergent. In general, wash and dry with the warmest temperatures recommended on the label.  Clean all areas the individual has used often  Clean all touchable surfaces, such as counters, tabletops, doorknobs, bathroom fixtures, toilets, phones, keyboards, tablets, and bedside tables, every day. Also, clean any surfaces that may have blood, body fluids, and/or secretions or excretions on them.  Wear gloves when cleaning surfaces the patient has come in contact  with.  Use a diluted bleach solution (e.g., dilute bleach with 1 part bleach and 10 parts water) or a household disinfectant with a label that says EPA-registered for coronaviruses. To make a bleach solution at home, add 1 tablespoon of bleach to 1 quart (4 cups) of water. For a larger supply, add  cup of bleach to 1 gallon (16 cups) of water.  Read labels of cleaning products and follow recommendations provided on product labels. Labels contain instructions for safe and effective use of the cleaning product including precautions you should take when applying the product, such as wearing gloves or eye protection and making sure you have good ventilation during use of the product.  Remove gloves and wash hands immediately after cleaning.  Monitor yourself for signs and symptoms of illness Caregivers and household members are considered close contacts, should monitor their health, and will be asked to limit movement outside of the home to the extent possible. Follow the monitoring steps for close contacts listed on the symptom monitoring form.   ? If you have additional questions, contact your local health department or call the epidemiologist on call at [phone] (available 24/7). ? This guidance is subject to change. For the most up-to-date guidance from [facility], please refer to their website: [employer]

## 2020-07-30 DIAGNOSIS — M5441 Lumbago with sciatica, right side: Secondary | ICD-10-CM | POA: Diagnosis not present

## 2020-07-30 DIAGNOSIS — M47816 Spondylosis without myelopathy or radiculopathy, lumbar region: Secondary | ICD-10-CM | POA: Diagnosis not present

## 2020-07-30 DIAGNOSIS — M5416 Radiculopathy, lumbar region: Secondary | ICD-10-CM | POA: Diagnosis not present

## 2020-08-21 ENCOUNTER — Other Ambulatory Visit: Payer: Self-pay | Admitting: Orthopedic Surgery

## 2020-08-21 DIAGNOSIS — M5441 Lumbago with sciatica, right side: Secondary | ICD-10-CM

## 2020-08-27 ENCOUNTER — Ambulatory Visit: Admission: RE | Admit: 2020-08-27 | Payer: BC Managed Care – PPO | Source: Ambulatory Visit

## 2020-08-27 ENCOUNTER — Ambulatory Visit: Payer: BC Managed Care – PPO

## 2020-09-08 ENCOUNTER — Ambulatory Visit: Admission: RE | Admit: 2020-09-08 | Payer: BC Managed Care – PPO | Source: Ambulatory Visit

## 2020-09-25 ENCOUNTER — Ambulatory Visit: Payer: BC Managed Care – PPO

## 2020-10-10 ENCOUNTER — Ambulatory Visit: Payer: BC Managed Care – PPO | Admitting: Nurse Practitioner

## 2020-10-10 ENCOUNTER — Other Ambulatory Visit: Payer: Self-pay

## 2020-10-10 ENCOUNTER — Encounter: Payer: Self-pay | Admitting: Nurse Practitioner

## 2020-10-10 VITALS — BP 146/88 | HR 56 | Temp 98.6°F | Wt 188.2 lb

## 2020-10-10 DIAGNOSIS — R519 Headache, unspecified: Secondary | ICD-10-CM | POA: Insufficient documentation

## 2020-10-10 DIAGNOSIS — G4452 New daily persistent headache (NDPH): Secondary | ICD-10-CM

## 2020-10-10 MED ORDER — KETOROLAC TROMETHAMINE 60 MG/2ML IM SOLN
60.0000 mg | Freq: Once | INTRAMUSCULAR | Status: AC
  Administered 2020-10-10: 60 mg via INTRAMUSCULAR

## 2020-10-10 MED ORDER — ONDANSETRON 4 MG PO TBDP: 4.0000 mg | ORAL_TABLET | Freq: Three times a day (TID) | ORAL | 0 refills | Status: DC | PRN

## 2020-10-10 NOTE — Assessment & Plan Note (Signed)
New onset headache that started 4 days ago and is still present today. No red flags on exam. Neuro exam WNL. Will give toradol IM in office. Prescription sent for zofran. Also will treat with flonase and claritin or zyrtec. Call our office if headache does not improve or go away by next week.

## 2020-10-10 NOTE — Progress Notes (Signed)
Acute Office Visit  Subjective:    Patient ID: Jacob Key, male    DOB: 1980/08/13, 40 y.o.   MRN: [id number]  Chief Complaint  Patient presents with  . Headache    Pt states he has been having a pressure headache since Monday. States he has taken ibuprofen, excedrin, and tylenol with minimal relief    HPI Patient is in today for a headache that started on Monday. 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 He does not have a history of headaches.     HEADACHE   Duration: days Onset: gradual Severity: 6/10 Quality: throbbing, persistent Frequency: fluctuates, pain never goes away Location: forehead Headache duration: 4 days Radiation: yes some to the neck, but mostly the forehead Time of day headache occurs: mornings Alleviating factors: ibuprofen didn't help as much as he thought it would Aggravating factors: unknown Headache status at time of visit: current headache Treatments attempted: Treatments attempted: rest, ice, APAP, ibuprofen and aleve", excedrine   Aura: no Nausea:  yes Vomiting: no Photophobia:  no Phonophobia:  yes Effect on social functioning:  yes Numbers of missed days of school/work each month: none Confusion:  no Gait disturbance/ataxia:  no Behavioral changes:  no Fevers:  no    Past Medical History:  Diagnosis Date  . Depression     Past Surgical History:  Procedure Laterality Date  . FACIAL RECONSTRUCTION SURGERY     Lower face  . TONSILLECTOMY      Family History  Problem Relation Age of Onset  . Hyperlipidemia Mother   . Hyperlipidemia Father   . Alcohol abuse Maternal Grandmother   . Heart disease Maternal Grandmother   . Heart disease Maternal Grandfather   . Alzheimer's disease Paternal Grandfather     Social History   Socioeconomic History  . Marital status: Single    Spouse name: Not on file  . Number of children: Not on file  . Years of education: Not on file  . Highest education  level: Not on file  Occupational History  . Not on file  Tobacco Use  . Smoking status: Current Every Day Smoker    Packs/day: 0.50    Years: 0.50    Pack years: 0.25    Types: Cigarettes, E-cigarettes    Start date: 01/18/2015  . Smokeless tobacco: Never Used  Vaping Use  . Vaping Use: Some days  Substance and Sexual Activity  . Alcohol use: Yes    Alcohol/week: 20.0 standard drinks    Types: 20 Cans of beer per week    Comment: moderate  . Drug use: No  . Sexual activity: Yes  Other Topics Concern  . Not on file  Social History Narrative  . Not on file   Social Determinants of Health   Financial Resource Strain: Not on file  Food Insecurity: Not on file  Transportation Needs: Not on file  Physical Activity: Not on file  Stress: Not on file  Social Connections: Not on file  Intimate Partner Violence: Not on file    Outpatient Medications Prior to Visit  Medication Sig Dispense Refill  . ibuprofen (ADVIL) 800 MG tablet Take 1 tablet (800 mg total) by mouth every 8 (eight) hours as needed. 180 tablet 1  . PARoxetine (PAXIL) 40 MG tablet Take 1 tablet (40 mg total) by mouth daily. 90 tablet 1  . rosuvastatin (CRESTOR) 20 MG tablet Take 1 tablet (20 mg total) by mouth daily. 90 tablet 1  No facility-administered medications prior to visit.    No Known Allergies  Review of Systems  Constitutional: Negative.   HENT: Negative.   Eyes: Negative.   Respiratory: Negative.   Cardiovascular: Negative.   Gastrointestinal: Positive for nausea. Negative for abdominal pain, constipation and diarrhea.  Genitourinary: Negative.   Musculoskeletal: Negative.   Skin: Negative.   Neurological: Positive for headaches. Negative for dizziness, light-headedness and numbness.  Psychiatric/Behavioral: Negative.        Objective:    Physical Exam Vitals and nursing note reviewed.  Constitutional:      Appearance: Normal appearance.  HENT:     Head: Normocephalic.  Eyes:      Extraocular Movements: Extraocular movements intact.     Conjunctiva/sclera: Conjunctivae normal.     Pupils: Pupils are equal, round, and reactive to light.  Cardiovascular:     Rate and Rhythm: Normal rate and regular rhythm.     Pulses: Normal pulses.     Heart sounds: Normal heart sounds.  Pulmonary:     Effort: Pulmonary effort is normal.     Breath sounds: Normal breath sounds.  Musculoskeletal:     Cervical back: Normal range of motion.  Skin:    General: Skin is warm.  Neurological:     General: No focal deficit present.     Mental Status: He is alert and oriented to person, place, and time.     Cranial Nerves: No cranial nerve deficit, dysarthria or facial asymmetry.     Motor: Weakness present.     Gait: Gait normal.     Deep Tendon Reflexes: Reflexes normal.  Psychiatric:        Mood and Affect: Mood normal.        Speech: Speech normal.        Behavior: Behavior normal.        Thought Content: Thought content normal.        Judgment: Judgment normal.     BP (!) 146/88   Pulse (!) 56   Temp 98.6 F (37 C) (Oral)   Wt 188 lb 3.2 oz (85.4 kg)   SpO2 98%   BMI 27.79 kg/m  Wt Readings from Last 3 Encounters:  10/10/20 188 lb 3.2 oz (85.4 kg)  07/08/20 178 lb (80.7 kg)  04/24/20 181 lb (82.1 kg)    There are no preventive care reminders to display for this patient.  There are no preventive care reminders to display for this patient.   Lab Results  Component Value Date   TSH 0.963 04/24/2020   Lab Results  Component Value Date   WBC 5.0 04/24/2020   HGB 14.8 04/24/2020   HCT 42.8 04/24/2020   MCV 89 04/24/2020   PLT 263 04/24/2020   Lab Results  Component Value Date   NA 139 04/24/2020   K 4.2 04/24/2020   CO2 25 04/24/2020   GLUCOSE 124 (H) 04/24/2020   BUN 17 04/24/2020   CREATININE 0.83 04/24/2020   BILITOT 0.4 04/24/2020   ALKPHOS 74 04/24/2020   AST 36 04/24/2020   ALT 27 04/24/2020   PROT 6.9 04/24/2020   ALBUMIN 4.8 04/24/2020    CALCIUM 10.1 04/24/2020   ANIONGAP 4 (L) 09/15/2013   GFR 83.72 01/01/2014   Lab Results  Component Value Date   CHOL 202 (H) 04/24/2020   Lab Results  Component Value Date   HDL 60 04/24/2020   Lab Results  Component Value Date   LDLCALC 116 (H) 04/24/2020   Lab  Results  Component Value Date   TRIG 149 04/24/2020   No results found for: CHOLHDL No results found for: KKXF8H     Assessment & Plan:   Problem List Items Addressed This Visit      Other   New daily persistent headache - Primary    New onset headache that started 4 days ago and is still present today. No red flags on exam. Neuro exam WNL. Will give toradol IM in office. Prescription sent for zofran. Also will treat with flonase and claritin or zyrtec. Call our office if headache does not improve or go away by next week.           Meds ordered this encounter  Medications  . ketorolac (TORADOL) injection 60 mg  . ondansetron (ZOFRAN ODT) 4 MG disintegrating tablet    Sig: Take 1 tablet (4 mg total) by mouth every 8 (eight) hours as needed for nausea or vomiting.    Dispense:  20 tablet    Refill:  0      Gerre Scull, NP

## 2020-10-10 NOTE — Patient Instructions (Addendum)
It was great to see you!  Flonase two pumps each nostril once a day. Take claritin or zyrtec over the counter. I'm going to send in some nausea medicine.  Let's follow-up in 1 week if you symptoms don't improve or sooner if you have concerns.  If a referral was placed today, you will be contacted for an appointment. Please note that routine referrals can sometimes take up to 3-4 weeks to process. Please call our office if you haven't heard anything after this time frame.  Take care,  Rodman Pickle, NP

## 2020-10-23 ENCOUNTER — Ambulatory Visit: Payer: BC Managed Care – PPO | Admitting: Family Medicine

## 2020-10-31 ENCOUNTER — Other Ambulatory Visit: Payer: Self-pay

## 2020-10-31 ENCOUNTER — Ambulatory Visit: Payer: BC Managed Care – PPO | Admitting: Family Medicine

## 2020-10-31 ENCOUNTER — Encounter: Payer: Self-pay | Admitting: Family Medicine

## 2020-10-31 VITALS — BP 137/78 | HR 67 | Temp 98.5°F | Ht 69.0 in | Wt 182.0 lb

## 2020-10-31 DIAGNOSIS — E782 Mixed hyperlipidemia: Secondary | ICD-10-CM | POA: Diagnosis not present

## 2020-10-31 DIAGNOSIS — G4452 New daily persistent headache (NDPH): Secondary | ICD-10-CM

## 2020-10-31 DIAGNOSIS — F3342 Major depressive disorder, recurrent, in full remission: Secondary | ICD-10-CM

## 2020-10-31 MED ORDER — PAROXETINE HCL 40 MG PO TABS: 40.0000 mg | ORAL_TABLET | Freq: Every day | ORAL | 1 refills | Status: DC

## 2020-10-31 MED ORDER — ROSUVASTATIN CALCIUM 20 MG PO TABS: 20.0000 mg | ORAL_TABLET | Freq: Every day | ORAL | 1 refills | Status: DC

## 2020-10-31 MED ORDER — NORTRIPTYLINE HCL 25 MG PO CAPS: 25.0000 mg | ORAL_CAPSULE | Freq: Every day | ORAL | 3 refills | Status: DC

## 2020-10-31 NOTE — Progress Notes (Signed)
BP 137/78   Pulse 67   Temp 98.5 F (36.9 C) (Oral)   Ht 5\' 9"  (1.753 m)   Wt 182 lb (82.6 kg)   SpO2 98%   BMI 26.88 kg/m    Subjective:    Patient ID: Jacob Key, male    DOB: 12/07/1980, 40 y.o.   MRN: [id number]  HPI: Jacob Key is a 40 y.o. male  Chief Complaint  Patient presents with  . Depression  . Hyperlipidemia   AM headaches lasting all day have been worse. No visual changes, no numbness or tingling, no aura. No nausea or vomiting. Kenalog helped, but have been persistent daily for the last month. Nothing else makes them better or worse  HYPERLIPIDEMIA Hyperlipidemia status: excellent compliance Satisfied with current treatment?  yes Side effects:  no Medication compliance: excellent compliance Past cholesterol meds: crestor Supplements: none Aspirin:  no The ASCVD Risk score 24 DC Jr., et al., 2013) failed to calculate for the following reasons:   The 2013 ASCVD risk score is only valid for ages 69 to 70 Chest pain:  no  DEPRESSION Mood status: controlled Satisfied with current treatment?: yes Symptom severity: mild  Duration of current treatment : chronic Side effects: no Medication compliance: excellent compliance Psychotherapy/counseling: no  Previous psychiatric medications: paxil Depressed mood: no Anxious mood: no Anhedonia: no Significant weight loss or gain: no Insomnia: no  Fatigue: no Feelings of worthlessness or guilt: no Impaired concentration/indecisiveness: no Suicidal ideations: no Hopelessness: no Crying spells: no Depression screen [facility] 2/9 10/31/2020 04/24/2020 04/24/2020 01/04/2020 08/24/2019  Decreased Interest 0 0 0 0 0  Down, Depressed, Hopeless 0 0 0 0 0  PHQ - 2 Score 0 0 0 0 0  Altered sleeping 0 0 - 1 1  Tired, decreased energy 0 0 - 0 0  Change in appetite 0 0 - 0 0  Feeling bad or failure about yourself  0 0 - 0 0  Trouble concentrating 0 0 - 1 0  Moving slowly or fidgety/restless 0 0 - 0 1  Suicidal  thoughts 0 0 - 0 0  PHQ-9 Score 0 0 - 2 2  Difficult doing work/chores Not difficult at all Not difficult at all - - -  Some recent data might be hidden    Relevant past medical, surgical, family and social history reviewed and updated as indicated. Interim medical history since our last visit reviewed. Allergies and medications reviewed and updated.  Review of Systems  Constitutional: Negative.   Respiratory: Negative.   Cardiovascular: Negative.   Gastrointestinal: Negative.   Musculoskeletal: Negative.   Psychiatric/Behavioral: Negative.     Per HPI unless specifically indicated above     Objective:    BP 137/78   Pulse 67   Temp 98.5 F (36.9 C) (Oral)   Ht 5\' 9"  (1.753 m)   Wt 182 lb (82.6 kg)   SpO2 98%   BMI 26.88 kg/m   Wt Readings from Last 3 Encounters:  10/31/20 182 lb (82.6 kg)  10/10/20 188 lb 3.2 oz (85.4 kg)  07/08/20 178 lb (80.7 kg)    Physical Exam Vitals and nursing note reviewed.  Constitutional:      General: He is not in acute distress.    Appearance: Normal appearance. He is not ill-appearing, toxic-appearing or diaphoretic.  HENT:     Head: Normocephalic and atraumatic.     Right Ear: External ear normal.     Left Ear: External ear normal.  Nose: Nose normal.     Mouth/Throat:     Mouth: Mucous membranes are moist.     Pharynx: Oropharynx is clear.  Eyes:     General: No scleral icterus.       Right eye: No discharge.        Left eye: No discharge.     Extraocular Movements: Extraocular movements intact.     Conjunctiva/sclera: Conjunctivae normal.     Pupils: Pupils are equal, round, and reactive to light.  Cardiovascular:     Rate and Rhythm: Normal rate and regular rhythm.     Pulses: Normal pulses.     Heart sounds: Normal heart sounds. No murmur heard. No friction rub. No gallop.   Pulmonary:     Effort: Pulmonary effort is normal. No respiratory distress.     Breath sounds: Normal breath sounds. No stridor. No wheezing,  rhonchi or rales.  Chest:     Chest wall: No tenderness.  Musculoskeletal:        General: Normal range of motion.     Cervical back: Normal range of motion and neck supple.  Skin:    General: Skin is warm and dry.     Capillary Refill: Capillary refill takes less than 2 seconds.     Coloration: Skin is not jaundiced or pale.     Findings: No bruising, erythema, lesion or rash.  Neurological:     General: No focal deficit present.     Mental Status: He is alert and oriented to person, place, and time. Mental status is at baseline.  Psychiatric:        Mood and Affect: Mood normal.        Behavior: Behavior normal.        Thought Content: Thought content normal.        Judgment: Judgment normal.     Results for orders placed or performed in visit on 04/24/20  CBC with Differential/Platelet  Result Value Ref Range   WBC 5.0 3.4 - 10.8 x10E3/uL   RBC 4.79 4.14 - 5.80 x10E6/uL   Hemoglobin 14.8 13.0 - 17.7 g/dL   Hematocrit 56.2 13.0 - 51.0 %   MCV 89 79 - 97 fL   MCH 30.9 26.6 - 33.0 pg   MCHC 34.6 31.5 - 35.7 g/dL   RDW 86.5 78.4 - 69.6 %   Platelets 263 150 - 450 x10E3/uL   Neutrophils 65 Not Estab. %   Lymphs 26 Not Estab. %   Monocytes 6 Not Estab. %   Eos 3 Not Estab. %   Basos 0 Not Estab. %   Neutrophils Absolute 3.2 1.4 - 7.0 x10E3/uL   Lymphocytes Absolute 1.3 0.7 - 3.1 x10E3/uL   Monocytes Absolute 0.3 0.1 - 0.9 x10E3/uL   EOS (ABSOLUTE) 0.2 0.0 - 0.4 x10E3/uL   Basophils Absolute 0.0 0.0 - 0.2 x10E3/uL   Immature Granulocytes 0 Not Estab. %   Immature Grans (Abs) 0.0 0.0 - 0.1 x10E3/uL  Comprehensive metabolic panel  Result Value Ref Range   Glucose 124 (H) 65 - 99 mg/dL   BUN 17 6 - 20 mg/dL   Creatinine, Ser 2.95 0.76 - 1.27 mg/dL   GFR calc non Af Amer 111 >59 mL/min/1.73   GFR calc Af Amer 128 >59 mL/min/1.73   BUN/Creatinine Ratio 20 9 - 20   Sodium 139 134 - 144 mmol/L   Potassium 4.2 3.5 - 5.2 mmol/L   Chloride 103 96 - 106 mmol/L   CO2 25 20 -  29 mmol/L   Calcium 10.1 8.7 - 10.2 mg/dL   Total Protein 6.9 6.0 - 8.5 g/dL   Albumin 4.8 4.0 - 5.0 g/dL   Globulin, Total 2.1 1.5 - 4.5 g/dL   Albumin/Globulin Ratio 2.3 (H) 1.2 - 2.2   Bilirubin Total 0.4 0.0 - 1.2 mg/dL   Alkaline Phosphatase 74 44 - 121 IU/L   AST 36 0 - 40 IU/L   ALT 27 0 - 44 IU/L  Lipid Panel w/o Chol/HDL Ratio  Result Value Ref Range   Cholesterol, Total 202 (H) 100 - 199 mg/dL   Triglycerides 009 0 - 149 mg/dL   HDL 60 >38 mg/dL   VLDL Cholesterol Cal 26 5 - 40 mg/dL   LDL Chol Calc (NIH) 182 (H) 0 - 99 mg/dL  Microalbumin, Urine Waived  Result Value Ref Range   Microalb, Ur Waived 30 (H) 0 - 19 mg/L   Creatinine, Urine Waived 300 10 - 300 mg/dL   Microalb/Creat Ratio <30 <30 mg/g  TSH  Result Value Ref Range   TSH 0.963 0.450 - 4.500 uIU/mL  Urinalysis, Routine w reflex microscopic  Result Value Ref Range   Specific Gravity, UA 1.025 1.005 - 1.030   pH, UA 6.5 5.0 - 7.5   Color, UA Yellow Yellow   Appearance Ur Clear Clear   Leukocytes,UA Negative Negative   Protein,UA Negative Negative/Trace   Glucose, UA Negative Negative   Ketones, UA Negative Negative   RBC, UA Negative Negative   Bilirubin, UA Negative Negative   Urobilinogen, Ur 1.0 0.2 - 1.0 mg/dL   Nitrite, UA Negative Negative  Hepatitis C Antibody  Result Value Ref Range   Hep C Virus Ab <0.1 0.0 - 0.9 s/co ratio  SAR CoV2 Serology (COVID 19)AB(IGG)IA  Result Value Ref Range   SARS-CoV-2 Semi-Quant IgG Ab <13.0 Neg <13.0 AU/mL   SARS-CoV-2 Spike Ab Interp Negative       Assessment & Plan:   Problem List Items Addressed This Visit      Other   Depression   Hyperlipidemia - Primary   Relevant Orders   Comprehensive metabolic panel   Lipid Panel w/o Chol/HDL Ratio       Follow up plan: No follow-ups on file.

## 2020-10-31 NOTE — Assessment & Plan Note (Signed)
Will get him started on nortriptyline. Given AM headaches, if not better, consider sleep study. Call with any concerns. Recheck 1 month.

## 2020-10-31 NOTE — Assessment & Plan Note (Signed)
Under good control on current regimen. Continue current regimen. Continue to monitor. Call with any concerns. Refills given. Labs drawn today.   

## 2020-10-31 NOTE — Assessment & Plan Note (Signed)
Under good control on current regimen. Continue current regimen. Continue to monitor. Call with any concerns. Refills given.   

## 2020-11-01 LAB — LIPID PANEL W/O CHOL/HDL RATIO
Cholesterol, Total: 214 mg/dL — ABNORMAL HIGH (ref 100–199)
HDL: 57 mg/dL (ref >39)
LDL Chol Calc (NIH): 127 mg/dL — ABNORMAL HIGH (ref 0–99)
Triglycerides: 170 mg/dL — ABNORMAL HIGH (ref 0–149)
VLDL Cholesterol Cal: 30 mg/dL (ref 5–40)

## 2020-11-01 LAB — COMPREHENSIVE METABOLIC PANEL
ALT: 40 IU/L (ref 0–44)
AST: 32 IU/L (ref 0–40)
Albumin/Globulin Ratio: 2.4 — ABNORMAL HIGH (ref 1.2–2.2)
Albumin: 5.2 g/dL — ABNORMAL HIGH (ref 4.0–5.0)
Alkaline Phosphatase: 65 IU/L (ref 44–121)
BUN/Creatinine Ratio: 18 (ref 9–20)
BUN: 19 mg/dL (ref 6–20)
Bilirubin Total: 0.3 mg/dL (ref 0.0–1.2)
CO2: 27 mmol/L (ref 20–29)
Calcium: 10.2 mg/dL (ref 8.7–10.2)
Chloride: 103 mmol/L (ref 96–106)
Creatinine, Ser: 1.04 mg/dL (ref 0.76–1.27)
Globulin, Total: 2.2 g/dL (ref 1.5–4.5)
Glucose: 92 mg/dL (ref 65–99)
Potassium: 4.5 mmol/L (ref 3.5–5.2)
Sodium: 143 mmol/L (ref 134–144)
Total Protein: 7.4 g/dL (ref 6.0–8.5)
eGFR: 94 mL/min/{1.73_m2} (ref >59)

## 2020-12-01 ENCOUNTER — Telehealth: Payer: BC Managed Care – PPO | Admitting: Family Medicine

## 2020-12-02 ENCOUNTER — Encounter: Payer: Self-pay | Admitting: Family Medicine

## 2020-12-02 ENCOUNTER — Other Ambulatory Visit: Payer: Self-pay

## 2020-12-02 ENCOUNTER — Telehealth (INDEPENDENT_AMBULATORY_CARE_PROVIDER_SITE_OTHER): Payer: BC Managed Care – PPO | Admitting: Family Medicine

## 2020-12-02 VITALS — BP 147/82 | HR 66

## 2020-12-02 DIAGNOSIS — G4452 New daily persistent headache (NDPH): Secondary | ICD-10-CM | POA: Diagnosis not present

## 2020-12-02 MED ORDER — PROPRANOLOL HCL ER 80 MG PO CP24: 80.0000 mg | ORAL_CAPSULE | Freq: Every day | ORAL | 2 refills | Status: DC

## 2020-12-02 NOTE — Assessment & Plan Note (Signed)
Could not tolerate nortriptyline. Will change to propranolol. Recheck 1 month. Call with any concerns. Continue to monitor.

## 2020-12-02 NOTE — Progress Notes (Signed)
BP (!) 147/82   Pulse 66    Subjective:    Patient ID: Jacob Key, male    DOB: [birth year], 40 y.o.   MRN: [id number]  HPI: Jacob Key is a 40 y.o. male  Chief Complaint  Patient presents with   Headache    Patient states he did not take the nortriptyline    Head has been been on and off. He tried the nortiptyline and it made him too sleepy.   MIGRAINES Duration: chronic Onset: sudden Severity: severe Quality: aching and pressure Frequency: daily Location: behind his eyes Headache duration: hours Radiation: no Time of day headache occurs: at the end of the day Headache status at time of visit: asymptomatic Treatments attempted: none, rest, ice, heat, APAP, ibuprofen, aleve", excedrine, and amitriptyline   Aura: no Nausea:  no Vomiting: no Photophobia:  no Phonophobia:  no Effect on social functioning:  no Confusion:  no Gait disturbance/ataxia:  no Behavioral changes:  no Fevers:  no   Relevant past medical, surgical, family and social history reviewed and updated as indicated. Interim medical history since our last visit reviewed. Allergies and medications reviewed and updated.  Review of Systems  Constitutional: Negative.   Respiratory: Negative.    Cardiovascular: Negative.   Gastrointestinal: Negative.   Musculoskeletal: Negative.   Skin: Negative.   Neurological:  Positive for headaches. Negative for dizziness, tremors, seizures, syncope, facial asymmetry, speech difficulty, weakness, light-headedness and numbness.  Psychiatric/Behavioral: Negative.     Per HPI unless specifically indicated above     Objective:    BP (!) 147/82   Pulse 66   Wt Readings from Last 3 Encounters:  10/31/20 182 lb (82.6 kg)  10/10/20 188 lb 3.2 oz (85.4 kg)  07/08/20 178 lb (80.7 kg)    Physical Exam Vitals and nursing note reviewed.  Constitutional:      General: He is not in acute distress.    Appearance: Normal appearance. He is not ill-appearing,  toxic-appearing or diaphoretic.  HENT:     Head: Normocephalic and atraumatic.     Right Ear: External ear normal.     Left Ear: External ear normal.     Nose: Nose normal.     Mouth/Throat:     Mouth: Mucous membranes are moist.     Pharynx: Oropharynx is clear.  Eyes:     General: No scleral icterus.       Right eye: No discharge.        Left eye: No discharge.     Conjunctiva/sclera: Conjunctivae normal.     Pupils: Pupils are equal, round, and reactive to light.  Pulmonary:     Effort: Pulmonary effort is normal. No respiratory distress.     Comments: Speaking in full sentences Musculoskeletal:        General: Normal range of motion.     Cervical back: Normal range of motion.  Skin:    Coloration: Skin is not jaundiced or pale.     Findings: No bruising, erythema, lesion or rash.  Neurological:     Mental Status: He is alert and oriented to person, place, and time. Mental status is at baseline.  Psychiatric:        Mood and Affect: Mood normal.        Behavior: Behavior normal.        Thought Content: Thought content normal.        Judgment: Judgment normal.    Results for orders placed or performed in visit on  10/31/20  Comprehensive metabolic panel  Result Value Ref Range   Glucose 92 65 - 99 mg/dL   BUN 19 6 - 20 mg/dL   Creatinine, Ser 1.04 0.76 - 1.27 mg/dL   eGFR 94 >59 mL/min/1.73   BUN/Creatinine Ratio 18 9 - 20   Sodium 143 134 - 144 mmol/L   Potassium 4.5 3.5 - 5.2 mmol/L   Chloride 103 96 - 106 mmol/L   CO2 27 20 - 29 mmol/L   Calcium 10.2 8.7 - 10.2 mg/dL   Total Protein 7.4 6.0 - 8.5 g/dL   Albumin 5.2 (H) 4.0 - 5.0 g/dL   Globulin, Total 2.2 1.5 - 4.5 g/dL   Albumin/Globulin Ratio 2.4 (H) 1.2 - 2.2   Bilirubin Total 0.3 0.0 - 1.2 mg/dL   Alkaline Phosphatase 65 44 - 121 IU/L   AST 32 0 - 40 IU/L   ALT 40 0 - 44 IU/L  Lipid Panel w/o Chol/HDL Ratio  Result Value Ref Range   Cholesterol, Total 214 (H) 100 - 199 mg/dL   Triglycerides 170 (H) 0 -  149 mg/dL   HDL 57 >39 mg/dL   VLDL Cholesterol Cal 30 5 - 40 mg/dL   LDL Chol Calc (NIH) 127 (H) 0 - 99 mg/dL      Assessment & Plan:   Problem List Items Addressed This Visit       Other   New daily persistent headache - Primary    Could not tolerate nortriptyline. Will change to propranolol. Recheck 1 month. Call with any concerns. Continue to monitor.       Relevant Medications   propranolol ER (INDERAL LA) 80 MG 24 hr capsule     Follow up plan: Return in about 4 weeks (around 12/30/2020).   This visit was completed via video visit through MyChart due to the restrictions of the COVID-19 pandemic. All issues as above were discussed and addressed. Physical exam was done as above through visual confirmation on video through MyChart. If it was felt that the patient should be evaluated in the office, they were directed there. The patient verbally consented to this visit. Location of the patient: home Location of the provider: work Those involved with this call:  Provider: Park Liter, DO CMA: Louanna Raw, [place] Desk/Registration: Jill Side  Time spent on call:  15 minutes with patient face to face via video conference. More than 50% of this time was spent in counseling and coordination of care. 23 minutes total spent in review of patient's record and preparation of their chart.

## 2020-12-05 ENCOUNTER — Telehealth: Payer: Self-pay

## 2020-12-05 NOTE — Telephone Encounter (Signed)
Lvm to schedule this apt.  °

## 2020-12-05 NOTE — Telephone Encounter (Signed)
-----   Message from Dorcas Carrow, [place] sent at 12/02/2020  4:14 PM EDT ----- 4 weeks

## 2020-12-08 NOTE — Telephone Encounter (Signed)
Lvm to make this apt. 

## 2020-12-11 NOTE — Telephone Encounter (Signed)
Lvm to make this apt. 

## 2021-02-25 ENCOUNTER — Ambulatory Visit (INDEPENDENT_AMBULATORY_CARE_PROVIDER_SITE_OTHER): Payer: BC Managed Care – PPO | Admitting: Nurse Practitioner

## 2021-02-25 ENCOUNTER — Encounter: Payer: Self-pay | Admitting: Nurse Practitioner

## 2021-02-25 ENCOUNTER — Other Ambulatory Visit: Payer: Self-pay

## 2021-02-25 VITALS — BP 153/88 | HR 62 | Temp 99.3°F | Wt 185.4 lb

## 2021-02-25 DIAGNOSIS — S80862A Insect bite (nonvenomous), left lower leg, initial encounter: Secondary | ICD-10-CM | POA: Diagnosis not present

## 2021-02-25 DIAGNOSIS — L0390 Cellulitis, unspecified: Secondary | ICD-10-CM | POA: Insufficient documentation

## 2021-02-25 DIAGNOSIS — W57XXXA Bitten or stung by nonvenomous insect and other nonvenomous arthropods, initial encounter: Secondary | ICD-10-CM | POA: Diagnosis not present

## 2021-02-25 DIAGNOSIS — L03116 Cellulitis of left lower limb: Secondary | ICD-10-CM | POA: Diagnosis not present

## 2021-02-25 MED ORDER — MUPIROCIN 2 % EX OINT: 1.0000 "application " | TOPICAL_OINTMENT | Freq: Two times a day (BID) | CUTANEOUS | 0 refills | Status: DC

## 2021-02-25 MED ORDER — IBUPROFEN 800 MG PO TABS: 800.0000 mg | ORAL_TABLET | Freq: Three times a day (TID) | ORAL | 1 refills | Status: DC | PRN

## 2021-02-25 MED ORDER — SULFAMETHOXAZOLE-TRIMETHOPRIM 800-160 MG PO TABS: 1.0000 | ORAL_TABLET | Freq: Two times a day (BID) | ORAL | 0 refills | Status: DC

## 2021-02-25 MED ORDER — ONDANSETRON 4 MG PO TBDP: 4.0000 mg | ORAL_TABLET | Freq: Three times a day (TID) | ORAL | 0 refills | Status: DC | PRN

## 2021-02-25 NOTE — Assessment & Plan Note (Signed)
From insect bite.  At this time will treat with Bactrim x 7 days and Mupirocin ointment.  Recommend he apply warm compresses to site 4 times daily and leave on 20 minutes.  If any worsening erythema or edema alert provider immediately.  Return in one week for follow-up.

## 2021-02-25 NOTE — Progress Notes (Signed)
BP (!) 153/88   Pulse 62   Temp 99.3 F (37.4 C) (Oral)   Wt 185 lb 6.4 oz (84.1 kg)   SpO2 99%   BMI 27.38 kg/m    Subjective:    Patient ID: Jacob Key, male    DOB: 12/21/1980, 40 y.o.   MRN: [id number]  HPI: Jacob Key is a 40 y.o. male  Chief Complaint  Patient presents with   Insect Bite    Patient is here for a bug bite. Patient states he was stretching before he for his run on Sunday evening and noticed pain while he was stretching. Patient noticed the area swelling and it causes pain when the swelling spreads due to the pressure in the area. Patient denies having any itching, draining or he states he has not had a fever.Patient states he has tried OTC medications and salve to help draw out anything that could possibly be inside.    Medication Refill    Patient is requesting a refill on prescription of Ibuprofen and Zofran as he takes both to help with his headaches as the Propranolol was too expensive even with patient's insurance.    Needs refills today on Ibuprofen and Zofran for his migraines, reports the Propranolol was too expensive, even with his insurance.  BUG BITE Obtained bug bite during stretching for run on Sunday evening, he noticed pain after this.  Area is red and swollen inner left calf.  Duration: days Location: inner left calf History of trauma in area: no Pain:  from swelling pushing on leg Quality: yes Severity: 6-7/10 Redness: yes Swelling: yes Oozing: no Pus: no Fevers: no Nausea/vomiting: no Status: stable Treatments attempted:antibiotics and ice Tetanus: UTD   Relevant past medical, surgical, family and social history reviewed and updated as indicated. Interim medical history since our last visit reviewed. Allergies and medications reviewed and updated.  Review of Systems  Constitutional:  Negative for activity change, diaphoresis, fatigue and fever.  Respiratory:  Negative for cough, chest tightness, shortness of breath and  wheezing.   Cardiovascular:  Negative for chest pain, palpitations and leg swelling.  Gastrointestinal: Negative.   Endocrine: Negative for cold intolerance, heat intolerance, polydipsia, polyphagia and polyuria.  Skin:  Positive for rash.  Neurological:  Negative for dizziness, syncope, weakness, light-headedness, numbness and headaches.  Psychiatric/Behavioral: Negative.     Per HPI unless specifically indicated above     Objective:    BP (!) 153/88   Pulse 62   Temp 99.3 F (37.4 C) (Oral)   Wt 185 lb 6.4 oz (84.1 kg)   SpO2 99%   BMI 27.38 kg/m   Wt Readings from Last 3 Encounters:  02/25/21 185 lb 6.4 oz (84.1 kg)  10/31/20 182 lb (82.6 kg)  10/10/20 188 lb 3.2 oz (85.4 kg)    Physical Exam Vitals and nursing note reviewed.  Constitutional:      General: He is awake. He is not in acute distress.    Appearance: He is well-developed and well-groomed. He is not ill-appearing or toxic-appearing.  HENT:     Head: Normocephalic and atraumatic.     Right Ear: Hearing normal. No drainage.     Left Ear: Hearing normal. No drainage.  Eyes:     General: Lids are normal.        Right eye: No discharge.        Left eye: No discharge.     Conjunctiva/sclera: Conjunctivae normal.     Pupils: Pupils are equal, round,  and reactive to light.  Neck:     Thyroid: No thyromegaly.     Vascular: No carotid bruit.  Cardiovascular:     Rate and Rhythm: Normal rate and regular rhythm.     Heart sounds: Normal heart sounds, S1 normal and S2 normal. No murmur heard.   No gallop.  Pulmonary:     Effort: Pulmonary effort is normal. No accessory muscle usage or respiratory distress.     Breath sounds: Normal breath sounds.  Abdominal:     General: Bowel sounds are normal.     Palpations: Abdomen is soft.  Musculoskeletal:        General: Normal range of motion.     Cervical back: Normal range of motion and neck supple.     Right lower leg: No edema.     Left lower leg: No edema.   Skin:    General: Skin is warm and dry.     Capillary Refill: Capillary refill takes less than 2 seconds.     Findings: Wound present.       Neurological:     Mental Status: He is alert and oriented to person, place, and time.  Psychiatric:        Attention and Perception: Attention normal.        Mood and Affect: Mood normal.        Speech: Speech normal.        Behavior: Behavior normal. Behavior is cooperative.        Thought Content: Thought content normal.    Results for orders placed or performed in visit on 10/31/20  Comprehensive metabolic panel  Result Value Ref Range   Glucose 92 65 - 99 mg/dL   BUN 19 6 - 20 mg/dL   Creatinine, Ser 1.04 0.76 - 1.27 mg/dL   eGFR 94 >59 mL/min/1.73   BUN/Creatinine Ratio 18 9 - 20   Sodium 143 134 - 144 mmol/L   Potassium 4.5 3.5 - 5.2 mmol/L   Chloride 103 96 - 106 mmol/L   CO2 27 20 - 29 mmol/L   Calcium 10.2 8.7 - 10.2 mg/dL   Total Protein 7.4 6.0 - 8.5 g/dL   Albumin 5.2 (H) 4.0 - 5.0 g/dL   Globulin, Total 2.2 1.5 - 4.5 g/dL   Albumin/Globulin Ratio 2.4 (H) 1.2 - 2.2   Bilirubin Total 0.3 0.0 - 1.2 mg/dL   Alkaline Phosphatase 65 44 - 121 IU/L   AST 32 0 - 40 IU/L   ALT 40 0 - 44 IU/L  Lipid Panel w/o Chol/HDL Ratio  Result Value Ref Range   Cholesterol, Total 214 (H) 100 - 199 mg/dL   Triglycerides 170 (H) 0 - 149 mg/dL   HDL 57 >39 mg/dL   VLDL Cholesterol Cal 30 5 - 40 mg/dL   LDL Chol Calc (NIH) 127 (H) 0 - 99 mg/dL      Assessment & Plan:   Problem List Items Addressed This Visit       Other   Cellulitis - Primary    From insect bite.  At this time will treat with Bactrim x 7 days and Mupirocin ointment.  Recommend he apply warm compresses to site 4 times daily and leave on 20 minutes.  If any worsening erythema or edema alert provider immediately.  Return in one week for follow-up.      Insect bite    Refer to cellulitis plan of care.        Follow up plan: Return  in about 1 week (around 03/04/2021)  for BUG BITE.

## 2021-02-25 NOTE — Patient Instructions (Signed)
Insect Bite, Adult An insect bite can make your skin red, itchy, and swollen. Some insects can spread disease to people with a bite. However, most insect bites do not lead to disease, and most are not serious. What are the causes? Insects may bite for many reasons, including: Hunger. To defend themselves. Insects that bite include: Spiders. Mosquitoes. Ticks. Fleas. Ants. Flies. Kissing bugs. Chiggers. What are the signs or symptoms? Symptoms of this condition include: Itching or pain in the bite area. Redness and swelling in the bite area. An open wound (skin ulcer). Symptoms often last for 2-4 days. In rare cases, a person may have a very bad allergic reaction (anaphylactic reaction) to a bite. Symptoms of an anaphylactic reaction may include: Feeling warm in the face (flushed). Your face may turn red. Itchy, red, swollen areas of skin (hives). Swelling of the: Eyes. Lips. Face. Mouth. Tongue. Throat. Trouble with any of these: Breathing. Talking. Swallowing. Loud breathing (wheezing). Feeling dizzy or light-headed. Passing out (fainting). Pain or cramps in your belly. Throwing up (vomiting). Watery poop (diarrhea). How is this treated? Treatment is usually not needed. Symptoms often go away on their own. When treatment is needed, it may involve: Putting a cream or lotion on the bite area. This helps with itching. Taking an antibiotic medicine. This treatment is needed if the bite area gets infected. Getting a tetanus shot, if you are not up to date on this vaccine. Putting ice on the affected area. Using medicines called antihistamines. This treatment may be needed if you have itching or an allergic reaction to the insect bite. Giving yourself a shot of medicine (epinephrine) using an auto-injector "pen" if you have an anaphylactic reaction to a bite. Your doctor will teach you how to use this pen. Follow these instructions at home: Bite area care  Do not  scratch the bite area. Keep the bite area clean and dry. Wash the bite area every day with soap and water as told by your doctor. Check the bite area every day for signs of infection. Check for: Redness, swelling, or pain. Fluid or blood. Warmth. Pus or a bad smell. Managing pain, itching, and swelling  You may put any of these on the bite area as told by your doctor: A paste made of baking soda and water. Cortisone cream. Calamine lotion. If told, put ice on the bite area. Put ice in a plastic bag. Place a towel between your skin and the bag. Leave the ice on for 20 minutes, 2-3 times a day. General instructions Apply or take over-the-counter and prescription medicines only as told by your doctor. If you were prescribed an antibiotic medicine, take or apply it as told by your doctor. Do not stop using the antibiotic even if your condition improves. Keep all follow-up visits as told by your doctor. This is important. How is this prevented? To help you have a lower risk of insect bites: When you are outside, wear clothing that covers your arms and legs. Use insect repellent. The best insect repellents contain one of these: DEET. Picaridin. Oil of lemon eucalyptus (OLE). IR3535. Consider spraying your clothing with a pesticide called permethrin. Permethrin helps prevent insect bites. It works for several weeks and for up to 5-6 clothing washes. Do not apply permethrin directly to the skin. If your home windows do not have screens, think about putting some in. If you will be sleeping in an area where there are mosquitoes, consider covering your sleeping area with a mosquito   net. Contact a doctor if: You have redness, swelling, or pain in the bite area. You have fluid or blood coming from the bite area. The bite area feels warm to the touch. You have pus or a bad smell coming from the bite area. You have a fever. Get help right away if: You have joint pain. You have a rash. You  feel more tired or sleepy than you normally do. You have neck pain. You have a headache. You feel weaker than you normally do. You have signs of an anaphylactic reaction. Signs may include: Feeling warm in the face. Itchy, red, swollen areas of skin. Swelling of your: Eyes. Lips. Face. Mouth. Tongue. Throat. Trouble with any of these: Breathing. Talking. Swallowing. Loud breathing. Feeling dizzy or light-headed. Passing out. Pain or cramps in your belly. Throwing up. Watery poop. These symptoms may be an emergency. Do not wait to see if the symptoms will go away. Do this right away: Use your auto-injector pen as you have been told. Get medical help. Call your local emergency services (911 in the [place] Do not drive yourself to the hospital. Summary An insect bite can make your skin red, itchy, and swollen. Treatment is usually not needed. Symptoms often go away on their own. Do not scratch the bite area. Keep it clean and dry. Ice can help with pain and itching from the bite. This information is not intended to replace advice given to you by your health care provider. Make sure you discuss any questions you have with your health care provider. Document Revised: 04/22/2020 Document Reviewed: 12/09/2017 Elsevier Patient Education  2022 [employer]  

## 2021-02-25 NOTE — Assessment & Plan Note (Signed)
Refer to cellulitis plan of care. 

## 2021-02-26 ENCOUNTER — Ambulatory Visit: Payer: Self-pay

## 2021-02-26 ENCOUNTER — Ambulatory Visit: Payer: Self-pay | Admitting: *Deleted

## 2021-02-26 NOTE — Telephone Encounter (Signed)
Pt stated he was just seen yesterday for a spider bite, pt stated today the pain is worse and he is not sure what to do. Pt was prescribed medications and started them yesterday but is not sure why the swelling and pain is worse today.

## 2021-02-26 NOTE — Telephone Encounter (Signed)
Opened triage encounter in error. This will be a TE. Patient seen yesterday by [facility] with an insect bite. Area around the bite has increased in swelling and rated the pain at a 9 this morning before ibuprofen. No increase in redness, no red streak identified, no fluid/pus draining and no fever. Feels warm to touch. Has taken 3 doses of Bactrim DS. Used warm compress last night. Ice pack earlier today.Elevate lower leg and ankle, monitor for changes/red streak. Advised UC for evaluation-stated he cannot afford to pay out of pocket at [facility] and would like further advice from provider, please.

## 2021-02-26 NOTE — Telephone Encounter (Signed)
Routing to provider to advise.  

## 2021-02-27 ENCOUNTER — Encounter: Payer: Self-pay | Admitting: Nurse Practitioner

## 2021-02-27 ENCOUNTER — Other Ambulatory Visit: Payer: Self-pay

## 2021-02-27 ENCOUNTER — Ambulatory Visit: Payer: BC Managed Care – PPO | Admitting: Nurse Practitioner

## 2021-02-27 ENCOUNTER — Ambulatory Visit
Admission: RE | Admit: 2021-02-27 | Discharge: 2021-02-27 | Disposition: A | Discharge status: Home / Self Care | Payer: BC Managed Care – PPO | Source: Ambulatory Visit | Attending: Nurse Practitioner | Admitting: Nurse Practitioner

## 2021-02-27 VITALS — BP 145/68 | HR 85 | Wt 185.0 lb

## 2021-02-27 DIAGNOSIS — R600 Localized edema: Secondary | ICD-10-CM

## 2021-02-27 DIAGNOSIS — L03116 Cellulitis of left lower limb: Secondary | ICD-10-CM | POA: Diagnosis not present

## 2021-02-27 DIAGNOSIS — S80862A Insect bite (nonvenomous), left lower leg, initial encounter: Secondary | ICD-10-CM

## 2021-02-27 DIAGNOSIS — W57XXXA Bitten or stung by nonvenomous insect and other nonvenomous arthropods, initial encounter: Secondary | ICD-10-CM

## 2021-02-27 DIAGNOSIS — M7989 Other specified soft tissue disorders: Secondary | ICD-10-CM | POA: Diagnosis not present

## 2021-02-27 MED ORDER — CEPHALEXIN 500 MG PO CAPS: 500.0000 mg | ORAL_CAPSULE | Freq: Four times a day (QID) | ORAL | 0 refills | Status: AC

## 2021-02-27 MED ORDER — CEFTRIAXONE SODIUM 500 MG IJ SOLR
500.0000 mg | Freq: Once | INTRAMUSCULAR | Status: AC
  Administered 2021-02-27: 500 mg via INTRAMUSCULAR

## 2021-02-27 NOTE — Progress Notes (Signed)
Contacted via MyChart   Good news Jacob Key, your imaging can back negative for DVT.  At this time continue as I discussed, as I am concerned for worsening infection.  Again, if worsening over weekend go immediately to ER as may need IV antibiotics.  Any questions? Keep being awesome!!  Thank you for allowing me to participate in your care.  I appreciate you. Kindest regards, Kallie Depolo

## 2021-02-27 NOTE — Assessment & Plan Note (Signed)
Refer to cellulitis plan of care. 

## 2021-02-27 NOTE — Patient Instructions (Addendum)
[facility] [address]  Cellulitis, Adult Cellulitis is a skin infection. The infected area is often warm, red, swollen, and sore. It occurs most often in the arms and lower legs. It is very important to get treated for this condition. What are the causes? This condition is caused by bacteria. The bacteria enter through a break in the skin, such as a cut, burn, insect bite, open sore, or crack. What increases the risk? This condition is more likely to occur in people who: Have a weak body defense system (immune system). Have open cuts, burns, bites, or scrapes on the skin. Are older than 40 years of age. Have a blood sugar problem (diabetes). Have a long-lasting (chronic) liver disease (cirrhosis) or kidney disease. Are very overweight (obese). Have a skin problem, such as: Itchy rash (eczema). Slow movement of blood in the veins (venous stasis). Fluid buildup below the skin (edema). Have been treated with high-energy rays (radiation). Use IV drugs. What are the signs or symptoms? Symptoms of this condition include: Skin that is: Red. Streaking. Spotting. Swollen. Sore or painful when you touch it. Warm. A fever. Chills. Blisters. How is this diagnosed? This condition is diagnosed based on: Medical history. Physical exam. Blood tests. Imaging tests. How is this treated? Treatment for this condition may include: Medicines to treat infections or allergies. Home care, such as: Rest. Placing cold or warm cloths (compresses) on the skin. Hospital care, if the condition is very bad. Follow these instructions at home: Medicines Take over-the-counter and prescription medicines only as told by your doctor. If you were prescribed an antibiotic medicine, take it as told by your doctor. Do not stop taking it even if you start to feel better. General instructions  Drink enough fluid to keep your pee (urine) pale yellow. Do not touch or rub the  infected area. Raise (elevate) the infected area above the level of your heart while you are sitting or lying down. Place cold or warm cloths on the area as told by your doctor. Keep all follow-up visits as told by your doctor. This is important. Contact a doctor if: You have a fever. You do not start to get better after 1-2 days of treatment. Your bone or joint under the infected area starts to hurt after the skin has healed. Your infection comes back. This can happen in the same area or another area. You have a swollen bump in the area. You have new symptoms. You feel ill and have muscle aches and pains. Get help right away if: Your symptoms get worse. You feel very sleepy. You throw up (vomit) or have watery poop (diarrhea) for a long time. You see red streaks coming from the area. Your red area gets larger. Your red area turns dark in color. These symptoms may represent a serious problem that is an emergency. Do not wait to see if the symptoms will go away. Get medical help right away. Call your local emergency services (911 in the [place] Do not drive yourself to the hospital. Summary Cellulitis is a skin infection. The area is often warm, red, swollen, and sore. This condition is treated with medicines, rest, and cold and warm cloths. Take all medicines only as told by your doctor. Tell your doctor if symptoms do not start to get better after 1-2 days of treatment. This information is not intended to replace advice given to you by your health care provider. Make sure you discuss any questions you have with  your health care provider. Document Revised: 10/20/2017 Document Reviewed: 10/20/2017 Elsevier Patient Education  [place].

## 2021-02-27 NOTE — Progress Notes (Signed)
BP (!) 145/68   Pulse 85   Wt 185 lb (83.9 kg)   SpO2 99%   BMI 27.32 kg/m    Subjective:    Patient ID: Jacob Key, male    DOB: 04/24/1981, 40 y.o.   MRN: [id number]  HPI: Jacob Key is a 40 y.o. male  Chief Complaint  Patient presents with   Insect Bite    Patient states he is having swelling in his left ankle since Tuesday and states it probably does not help that he is on his feet all the time. Patient states he woke up in the middle of the night with pain and Thursday morning when he went to work he noticed more pain and rates it about a 9. Patient states he took Ibuprofen and it helped a little bit.    BUG BITE Follow-up for bug bite, started Bactrim 48 hours ago.  Obtained bug bite during stretching for run on Sunday evening, he noticed pain after this.  Area is more swollen inner left calf -- started 24 hours after recent visit.   Duration: days Location: inner left calf History of trauma in area: no Pain:  from swelling pushing on leg Quality: yes Severity: 6-7/10 at recent visit, but has increased to 7-9/10 Redness: yes Swelling: yes Oozing: no Pus: no Fevers: no Nausea/vomiting: yes Status: stable Treatments attempted:antibiotics and ice + heat Tetanus: UTD   Relevant past medical, surgical, family and social history reviewed and updated as indicated. Interim medical history since our last visit reviewed. Allergies and medications reviewed and updated.  Review of Systems  Constitutional:  Negative for activity change, diaphoresis, fatigue and fever.  Respiratory:  Negative for cough, chest tightness, shortness of breath and wheezing.   Cardiovascular:  Negative for chest pain, palpitations and leg swelling.  Gastrointestinal: Negative.   Endocrine: Negative for cold intolerance, heat intolerance, polydipsia, polyphagia and polyuria.  Skin:  Positive for rash.  Neurological:  Negative for dizziness, syncope, weakness, light-headedness, numbness and  headaches.  Psychiatric/Behavioral: Negative.     Per HPI unless specifically indicated above     Objective:    BP (!) 145/68   Pulse 85   Wt 185 lb (83.9 kg)   SpO2 99%   BMI 27.32 kg/m   Wt Readings from Last 3 Encounters:  02/27/21 185 lb (83.9 kg)  02/25/21 185 lb 6.4 oz (84.1 kg)  10/31/20 182 lb (82.6 kg)    Physical Exam Vitals and nursing note reviewed.  Constitutional:      General: He is awake. He is not in acute distress.    Appearance: He is well-developed and well-groomed. He is not ill-appearing or toxic-appearing.  HENT:     Head: Normocephalic and atraumatic.     Right Ear: Hearing normal. No drainage.     Left Ear: Hearing normal. No drainage.  Eyes:     General: Lids are normal.        Right eye: No discharge.        Left eye: No discharge.     Conjunctiva/sclera: Conjunctivae normal.     Pupils: Pupils are equal, round, and reactive to light.  Neck:     Thyroid: No thyromegaly.     Vascular: No carotid bruit.  Cardiovascular:     Rate and Rhythm: Normal rate and regular rhythm.     Pulses:          Femoral pulses are 2+ on the right side and 2+ on the left side.  Popliteal pulses are 2+ on the right side and 2+ on the left side.       Dorsalis pedis pulses are 2+ on the right side and 2+ on the left side.       Posterior tibial pulses are 2+ on the right side and 2+ on the left side.     Heart sounds: Normal heart sounds, S1 normal and S2 normal. No murmur heard.   No gallop.     Comments: Negative Homans bilaterally. Pulmonary:     Effort: Pulmonary effort is normal. No accessory muscle usage or respiratory distress.     Breath sounds: Normal breath sounds.  Abdominal:     General: Bowel sounds are normal.     Palpations: Abdomen is soft.  Musculoskeletal:        General: Normal range of motion.     Cervical back: Normal range of motion and neck supple.     Right lower leg: No edema.     Left lower leg: 2+ Edema present.  Skin:     General: Skin is warm and dry.     Capillary Refill: Capillary refill takes less than 2 seconds.     Findings: Wound present.       Neurological:     Mental Status: He is alert and oriented to person, place, and time.  Psychiatric:        Attention and Perception: Attention normal.        Mood and Affect: Mood normal.        Speech: Speech normal.        Behavior: Behavior normal. Behavior is cooperative.        Thought Content: Thought content normal.   Results for orders placed or performed in visit on 10/31/20  Comprehensive metabolic panel  Result Value Ref Range   Glucose 92 65 - 99 mg/dL   BUN 19 6 - 20 mg/dL   Creatinine, Ser 1.04 0.76 - 1.27 mg/dL   eGFR 94 >59 mL/min/1.73   BUN/Creatinine Ratio 18 9 - 20   Sodium 143 134 - 144 mmol/L   Potassium 4.5 3.5 - 5.2 mmol/L   Chloride 103 96 - 106 mmol/L   CO2 27 20 - 29 mmol/L   Calcium 10.2 8.7 - 10.2 mg/dL   Total Protein 7.4 6.0 - 8.5 g/dL   Albumin 5.2 (H) 4.0 - 5.0 g/dL   Globulin, Total 2.2 1.5 - 4.5 g/dL   Albumin/Globulin Ratio 2.4 (H) 1.2 - 2.2   Bilirubin Total 0.3 0.0 - 1.2 mg/dL   Alkaline Phosphatase 65 44 - 121 IU/L   AST 32 0 - 40 IU/L   ALT 40 0 - 44 IU/L  Lipid Panel w/o Chol/HDL Ratio  Result Value Ref Range   Cholesterol, Total 214 (H) 100 - 199 mg/dL   Triglycerides 170 (H) 0 - 149 mg/dL   HDL 57 >39 mg/dL   VLDL Cholesterol Cal 30 5 - 40 mg/dL   LDL Chol Calc (NIH) 127 (H) 0 - 99 mg/dL      Assessment & Plan:   Problem List Items Addressed This Visit       Other   Cellulitis - Primary    Treated on 02/25/21 with Bactrim and at this time no improvement, worsening present.  Concern for other etiologies OR worsening infection, he does not want to go to ER or UC.  Will check labs today: CBC, CMP, CRP, ESR.  Obtain stat imaging to r/o DVT -- order  placed.  Rocephin injection in office today and will change Bactrim to Keflex QID.  Have advised patient if any worsening over weekend he is to  immediately go to ER as may require IV antibiotics to prevent worsening.  He agrees with this plan and is agreeable.      Relevant Orders   CBC with Differential/Platelet   Comprehensive metabolic panel   C-reactive protein   Sed Rate (ESR)   Insect bite    Refer to cellulitis plan of care.      Leg edema, left    Stat imaging to r/o DVT -- refer to cellulitis plan of care for further.      Relevant Orders   US Venous Img Lower Unilateral Left (DVT)   CBC with Differential/Platelet   Comprehensive metabolic panel   C-reactive protein   Sed Rate (ESR)     Follow up plan: Return in about 3 days (around 03/02/2021) for Cellulitis.

## 2021-02-27 NOTE — Telephone Encounter (Signed)
Patient coming in today at 1:00. 

## 2021-02-27 NOTE — Assessment & Plan Note (Signed)
Treated on 02/25/21 with Bactrim and at this time no improvement, worsening present.  Concern for other etiologies OR worsening infection, he does not want to go to ER or UC.  Will check labs today: CBC, CMP, CRP, ESR.  Obtain stat imaging to r/o DVT -- order placed.  Rocephin injection in office today and will change Bactrim to Keflex QID.  Have advised patient if any worsening over weekend he is to immediately go to ER as may require IV antibiotics to prevent worsening.  He agrees with this plan and is agreeable.

## 2021-02-27 NOTE — Assessment & Plan Note (Signed)
Stat imaging to r/o DVT -- refer to cellulitis plan of care for further.

## 2021-03-02 ENCOUNTER — Ambulatory Visit: Payer: BC Managed Care – PPO | Admitting: Family Medicine

## 2021-03-02 ENCOUNTER — Encounter: Payer: Self-pay | Admitting: Family Medicine

## 2021-03-02 ENCOUNTER — Other Ambulatory Visit: Payer: Self-pay

## 2021-03-02 VITALS — BP 144/85 | HR 67 | Temp 98.0°F | Resp 16 | Ht 69.0 in | Wt 186.8 lb

## 2021-03-02 DIAGNOSIS — L0291 Cutaneous abscess, unspecified: Secondary | ICD-10-CM | POA: Diagnosis not present

## 2021-03-02 NOTE — Progress Notes (Signed)
BP (!) 144/85 (BP Location: Left Arm, Patient Position: Sitting, Cuff Size: Large)   Pulse 67   Temp 98 F (36.7 C) (Oral)   Resp 16   Ht '5\' 9"'  (1.753 m)   Wt 186 lb 12.8 oz (84.7 kg)   SpO2 100%   BMI 27.59 kg/m    Subjective:    Patient ID: Jacob Key, male    DOB: 1980/07/25, 40 y.o.   MRN: [id number]  HPI: Pius Byrom is a 40 y.o. male  Chief Complaint  Patient presents with   Abscess    ABSCESS Duration: about a week Location: L calf Pain:  yes Quality:  aching and sore Severity: moderate Redness:  yes Swelling:  yes Warmth:  yes Oozing:  yes Pus:  yes Treatments attempted:antibiotics Past similar infections:  no Past MRSA skin infections:  no History of trauma in area:  yes- bug bite Fevers:  no Nausea/vomiting:  no  Relevant past medical, surgical, family and social history reviewed and updated as indicated. Interim medical history since our last visit reviewed. Allergies and medications reviewed and updated.  Review of Systems  Constitutional: Negative.   Respiratory: Negative.    Cardiovascular: Negative.   Gastrointestinal: Negative.   Skin:  Positive for color change and wound. Negative for pallor and rash.  Psychiatric/Behavioral: Negative.     Per HPI unless specifically indicated above     Objective:    BP (!) 144/85 (BP Location: Left Arm, Patient Position: Sitting, Cuff Size: Large)   Pulse 67   Temp 98 F (36.7 C) (Oral)   Resp 16   Ht '5\' 9"'  (1.753 m)   Wt 186 lb 12.8 oz (84.7 kg)   SpO2 100%   BMI 27.59 kg/m   Wt Readings from Last 3 Encounters:  03/02/21 186 lb 12.8 oz (84.7 kg)  02/27/21 185 lb (83.9 kg)  02/25/21 185 lb 6.4 oz (84.1 kg)    Physical Exam Vitals and nursing note reviewed.  Constitutional:      General: He is not in acute distress.    Appearance: Normal appearance. He is not ill-appearing, toxic-appearing or diaphoretic.  HENT:     Head: Normocephalic and atraumatic.     Right Ear: External ear  normal.     Left Ear: External ear normal.     Nose: Nose normal.     Mouth/Throat:     Mouth: Mucous membranes are moist.     Pharynx: Oropharynx is clear.  Eyes:     General: No scleral icterus.       Right eye: No discharge.        Left eye: No discharge.     Extraocular Movements: Extraocular movements intact.     Conjunctiva/sclera: Conjunctivae normal.     Pupils: Pupils are equal, round, and reactive to light.  Cardiovascular:     Rate and Rhythm: Normal rate and regular rhythm.     Pulses: Normal pulses.     Heart sounds: Normal heart sounds. No murmur heard.   No friction rub. No gallop.  Pulmonary:     Effort: Pulmonary effort is normal. No respiratory distress.     Breath sounds: Normal breath sounds. No stridor. No wheezing, rhonchi or rales.  Chest:     Chest wall: No tenderness.  Musculoskeletal:        General: Normal range of motion.     Cervical back: Normal range of motion and neck supple.  Skin:    General: Skin is warm  and dry.     Capillary Refill: Capillary refill takes less than 2 seconds.     Coloration: Skin is not jaundiced or pale.     Findings: No bruising, erythema, lesion or rash.     Comments: 2 in x 1.5 in fluctuant abscess on medial side of L calf with redness around   Neurological:     General: No focal deficit present.     Mental Status: He is alert and oriented to person, place, and time. Mental status is at baseline.  Psychiatric:        Mood and Affect: Mood normal.        Behavior: Behavior normal.        Thought Content: Thought content normal.        Judgment: Judgment normal.    Results for orders placed or performed in visit on 10/31/20  Comprehensive metabolic panel  Result Value Ref Range   Glucose 92 65 - 99 mg/dL   BUN 19 6 - 20 mg/dL   Creatinine, Ser 1.04 0.76 - 1.27 mg/dL   eGFR 94 >59 mL/min/1.73   BUN/Creatinine Ratio 18 9 - 20   Sodium 143 134 - 144 mmol/L   Potassium 4.5 3.5 - 5.2 mmol/L   Chloride 103 96 - 106  mmol/L   CO2 27 20 - 29 mmol/L   Calcium 10.2 8.7 - 10.2 mg/dL   Total Protein 7.4 6.0 - 8.5 g/dL   Albumin 5.2 (H) 4.0 - 5.0 g/dL   Globulin, Total 2.2 1.5 - 4.5 g/dL   Albumin/Globulin Ratio 2.4 (H) 1.2 - 2.2   Bilirubin Total 0.3 0.0 - 1.2 mg/dL   Alkaline Phosphatase 65 44 - 121 IU/L   AST 32 0 - 40 IU/L   ALT 40 0 - 44 IU/L  Lipid Panel w/o Chol/HDL Ratio  Result Value Ref Range   Cholesterol, Total 214 (H) 100 - 199 mg/dL   Triglycerides 170 (H) 0 - 149 mg/dL   HDL 57 >39 mg/dL   VLDL Cholesterol Cal 30 5 - 40 mg/dL   LDL Chol Calc (NIH) 127 (H) 0 - 99 mg/dL      Assessment & Plan:   Problem List Items Addressed This Visit   None Visit Diagnoses     Abscess    -  Primary   Drained today with copius amounts of pus draining. Packed with iodaform. Finish Abx. Follow up 2 days.         Follow up plan: Return Wednesday OK to double book me.

## 2021-03-04 ENCOUNTER — Ambulatory Visit (INDEPENDENT_AMBULATORY_CARE_PROVIDER_SITE_OTHER): Payer: BC Managed Care – PPO | Admitting: Family Medicine

## 2021-03-04 ENCOUNTER — Encounter: Payer: Self-pay | Admitting: Family Medicine

## 2021-03-04 ENCOUNTER — Other Ambulatory Visit: Payer: Self-pay

## 2021-03-04 VITALS — BP 153/77 | HR 67

## 2021-03-04 DIAGNOSIS — L0291 Cutaneous abscess, unspecified: Secondary | ICD-10-CM

## 2021-03-04 NOTE — Progress Notes (Signed)
Wound healing well. Shonna Chock changed today. Keep covered. Will change wick in 2 days. Call with any concerns.

## 2021-03-06 ENCOUNTER — Other Ambulatory Visit: Payer: Self-pay

## 2021-03-06 ENCOUNTER — Ambulatory Visit (INDEPENDENT_AMBULATORY_CARE_PROVIDER_SITE_OTHER): Payer: BC Managed Care – PPO | Admitting: Family Medicine

## 2021-03-06 ENCOUNTER — Encounter: Payer: Self-pay | Admitting: Family Medicine

## 2021-03-06 VITALS — BP 153/87 | HR 56 | Temp 98.5°F

## 2021-03-06 DIAGNOSIS — L0291 Cutaneous abscess, unspecified: Secondary | ICD-10-CM

## 2021-03-06 NOTE — Progress Notes (Signed)
Wound check visit

## 2021-03-06 NOTE — Progress Notes (Signed)
Wound healing well. Wick changed. Return for wick changed on Monday

## 2021-03-09 ENCOUNTER — Other Ambulatory Visit: Payer: Self-pay

## 2021-03-09 ENCOUNTER — Encounter: Payer: Self-pay | Admitting: Family Medicine

## 2021-03-09 ENCOUNTER — Ambulatory Visit (INDEPENDENT_AMBULATORY_CARE_PROVIDER_SITE_OTHER): Payer: BC Managed Care – PPO | Admitting: Family Medicine

## 2021-03-09 VITALS — BP 142/87 | HR 59 | Ht 69.0 in | Wt 186.0 lb

## 2021-03-09 DIAGNOSIS — L0291 Cutaneous abscess, unspecified: Secondary | ICD-10-CM

## 2021-03-09 NOTE — Progress Notes (Signed)
Wound continues to heal. Wick changed. Likely 1 more wick change before we will allow it to heal

## 2021-03-11 ENCOUNTER — Other Ambulatory Visit: Payer: Self-pay

## 2021-03-11 ENCOUNTER — Ambulatory Visit (INDEPENDENT_AMBULATORY_CARE_PROVIDER_SITE_OTHER): Payer: BC Managed Care – PPO | Admitting: Family Medicine

## 2021-03-11 ENCOUNTER — Encounter: Payer: Self-pay | Admitting: Family Medicine

## 2021-03-11 DIAGNOSIS — L0291 Cutaneous abscess, unspecified: Secondary | ICD-10-CM

## 2021-03-11 NOTE — Progress Notes (Signed)
Wound healing well. No need to repack. Call with any concerns. Continue to keep covered at home.

## 2021-04-28 ENCOUNTER — Ambulatory Visit: Payer: BC Managed Care – PPO | Admitting: Family Medicine

## 2021-05-01 ENCOUNTER — Ambulatory Visit: Payer: BC Managed Care – PPO | Admitting: Family Medicine

## 2021-07-20 ENCOUNTER — Other Ambulatory Visit: Payer: Self-pay | Admitting: Nurse Practitioner

## 2021-07-21 NOTE — Telephone Encounter (Signed)
Requested medications are due for refill today.  yes  Requested medications are on the active medications list.  yes  Last refill. 02/25/2021 #20 0 refills  Future visit scheduled.  no  Notes to clinic.  Medication not delegated.    Requested Prescriptions  Pending Prescriptions Disp Refills   ondansetron (ZOFRAN-ODT) 4 MG disintegrating tablet [Pharmacy Med Name: Ondansetron 4 MG Oral Tablet Disintegrating] 20 tablet 0    Sig: DISSOLVE 1 TABLET IN MOUTH EVERY 8 HOURS AS NEEDED FOR NAUSEA AND VOMITING     Not Delegated - Gastroenterology: Antiemetics - ondansetron Failed - 07/20/2021  2:00 PM      Failed - This refill cannot be delegated      Passed - AST in normal range and within 360 days    AST  Date Value Ref Range Status  10/31/2020 32 0 - 40 IU/L Final   SGOT(AST)  Date Value Ref Range Status  09/15/2013 36 15 - 37 Unit/L Final   AST (SGOT) Piccolo, Waived  Date Value Ref Range Status  02/06/2016 33 11 - 38 U/L Final          Passed - ALT in normal range and within 360 days    ALT  Date Value Ref Range Status  10/31/2020 40 0 - 44 IU/L Final   SGPT (ALT)  Date Value Ref Range Status  09/15/2013 27 12 - 78 U/L Final   ALT (SGPT) Piccolo, Waived  Date Value Ref Range Status  02/06/2016 23 10 - 47 U/L Final          Passed - Valid encounter within last 6 months    Recent Outpatient Visits           4 months ago Abscess   Anderson, Megan P, DO   4 months ago Abscess   Tornado, Megan P, DO   4 months ago Abscess   Delta, Megan P, DO   4 months ago Abscess   [place], Megan P, DO   4 months ago Abscess   Remy, Jefferson, DO

## 2021-07-27 ENCOUNTER — Ambulatory Visit: Payer: BC Managed Care – PPO | Admitting: Family Medicine

## 2021-07-29 ENCOUNTER — Encounter: Payer: Self-pay | Admitting: Family Medicine

## 2021-07-29 ENCOUNTER — Ambulatory Visit (INDEPENDENT_AMBULATORY_CARE_PROVIDER_SITE_OTHER): Payer: BC Managed Care – PPO | Admitting: Family Medicine

## 2021-07-29 ENCOUNTER — Other Ambulatory Visit: Payer: Self-pay

## 2021-07-29 VITALS — BP 138/80 | HR 57 | Temp 97.9°F | Wt 174.4 lb

## 2021-07-29 DIAGNOSIS — E782 Mixed hyperlipidemia: Secondary | ICD-10-CM | POA: Diagnosis not present

## 2021-07-29 DIAGNOSIS — F3342 Major depressive disorder, recurrent, in full remission: Secondary | ICD-10-CM | POA: Diagnosis not present

## 2021-07-29 DIAGNOSIS — Z23 Encounter for immunization: Secondary | ICD-10-CM | POA: Diagnosis not present

## 2021-07-29 DIAGNOSIS — R519 Headache, unspecified: Secondary | ICD-10-CM | POA: Diagnosis not present

## 2021-07-29 MED ORDER — IBUPROFEN 800 MG PO TABS: 800.0000 mg | ORAL_TABLET | Freq: Three times a day (TID) | ORAL | 1 refills | Status: DC | PRN

## 2021-07-29 MED ORDER — ROSUVASTATIN CALCIUM 20 MG PO TABS: 20.0000 mg | ORAL_TABLET | Freq: Every day | ORAL | 1 refills | Status: DC

## 2021-07-29 MED ORDER — ONDANSETRON 4 MG PO TBDP: ORAL_TABLET | ORAL | 6 refills | Status: DC

## 2021-07-29 MED ORDER — PAROXETINE HCL 40 MG PO TABS: 40.0000 mg | ORAL_TABLET | Freq: Every day | ORAL | 1 refills | Status: DC

## 2021-07-29 NOTE — Assessment & Plan Note (Signed)
Under good control on current regimen. Continue current regimen. Continue to monitor. Call with any concerns. Refills given. Labs drawn today.   

## 2021-07-29 NOTE — Assessment & Plan Note (Signed)
Does not want to change medicine at this time. Discussed effexor- will consider if getting worse. Will continue zofran. Call with any concerns.

## 2021-07-29 NOTE — Progress Notes (Signed)
BP 138/80    Pulse (!) 57    Temp 97.9 F (36.6 C)    Wt 174 lb 6.4 oz (79.1 kg)    SpO2 97%    BMI 25.75 kg/m    Subjective:    Patient ID: Jacob Key, male    DOB: Oct 06, 1980, 41 y.o.   MRN: [id number]  HPI: Jacob Key is a 41 y.o. male  Chief Complaint  Patient presents with   Hyperlipidemia   Anxiety    Patient states he is feeling more anxious lately    Headache    Patient states he continues to have headaches and would like a refill on zofran    ANXIETY/STRESS Duration: chronic Status:stable Anxious mood: yes  Excessive worrying: yes Irritability: no  Sweating: no Nausea: no Palpitations:no Hyperventilation: no Panic attacks: no Agoraphobia: no  Obscessions/compulsions: no Depressed mood: yes Depression screen [facility] 2/9 07/29/2021 03/02/2021 10/31/2020 04/24/2020 04/24/2020  Decreased Interest 0 0 0 0 0  Down, Depressed, Hopeless 0 0 0 0 0  PHQ - 2 Score 0 0 0 0 0  Altered sleeping 1 0 0 0 -  Tired, decreased energy 0 0 0 0 -  Change in appetite 0 0 0 0 -  Feeling bad or failure about yourself  0 0 0 0 -  Trouble concentrating 0 0 0 0 -  Moving slowly or fidgety/restless 0 0 0 0 -  Suicidal thoughts 0 0 0 0 -  PHQ-9 Score 1 0 0 0 -  Difficult doing work/chores - Not difficult at all Not difficult at all Not difficult at all -  Some recent data might be hidden   Anhedonia: no Weight changes: no Insomnia: no   Hypersomnia: no Fatigue/loss of energy: yes Feelings of worthlessness: no Feelings of guilt: no Impaired concentration/indecisiveness: no Suicidal ideations: no  Crying spells: no Recent Stressors/Life Changes: no   Relationship problems: no   Family stress: no     Financial stress: no    Job stress: no    Recent death/loss: no  HYPERLIPIDEMIA Hyperlipidemia status: excellent compliance Satisfied with current treatment?  yes Side effects:  no Medication compliance: excellent compliance Past cholesterol meds: crestor Supplements:  none Aspirin:  no The 10-year ASCVD risk score (Arnett DK, et al., 2019) is: 4%   Values used to calculate the score:     Age: 52 years     Sex: Male     Is Non-Hispanic African American: No     Diabetic: No     Tobacco smoker: Yes     Systolic Blood Pressure: 920 mmHg     Is BP treated: No     HDL Cholesterol: 57 mg/dL     Total Cholesterol: 214 mg/dL Chest pain:  no  Headaches have been OK. Zofran has been helping. Doesn't want to change medicine right now. No other concerns or complaints at this time.   Relevant past medical, surgical, family and social history reviewed and updated as indicated. Interim medical history since our last visit reviewed. Allergies and medications reviewed and updated.  Review of Systems  Constitutional: Negative.   Respiratory: Negative.    Cardiovascular: Negative.   Gastrointestinal: Negative.   Musculoskeletal: Negative.   Neurological:  Positive for headaches. Negative for dizziness, tremors, seizures, syncope, facial asymmetry, speech difficulty, weakness, light-headedness and numbness.  Psychiatric/Behavioral: Negative.     Per HPI unless specifically indicated above     Objective:    BP 138/80    Pulse Marland Kitchen)  57    Temp 97.9 F (36.6 C)    Wt 174 lb 6.4 oz (79.1 kg)    SpO2 97%    BMI 25.75 kg/m   Wt Readings from Last 3 Encounters:  07/29/21 174 lb 6.4 oz (79.1 kg)  03/09/21 186 lb (84.4 kg)  03/02/21 186 lb 12.8 oz (84.7 kg)    Physical Exam Vitals and nursing note reviewed.  Constitutional:      General: He is not in acute distress.    Appearance: Normal appearance. He is not ill-appearing, toxic-appearing or diaphoretic.  HENT:     Head: Normocephalic and atraumatic.     Right Ear: External ear normal.     Left Ear: External ear normal.     Nose: Nose normal.     Mouth/Throat:     Mouth: Mucous membranes are moist.     Pharynx: Oropharynx is clear.  Eyes:     General: No scleral icterus.       Right eye: No discharge.         Left eye: No discharge.     Extraocular Movements: Extraocular movements intact.     Conjunctiva/sclera: Conjunctivae normal.     Pupils: Pupils are equal, round, and reactive to light.  Cardiovascular:     Rate and Rhythm: Normal rate and regular rhythm.     Pulses: Normal pulses.     Heart sounds: Normal heart sounds. No murmur heard.   No friction rub. No gallop.  Pulmonary:     Effort: Pulmonary effort is normal. No respiratory distress.     Breath sounds: Normal breath sounds. No stridor. No wheezing, rhonchi or rales.  Chest:     Chest wall: No tenderness.  Musculoskeletal:        General: Normal range of motion.     Cervical back: Normal range of motion and neck supple.  Skin:    General: Skin is warm and dry.     Capillary Refill: Capillary refill takes less than 2 seconds.     Coloration: Skin is not jaundiced or pale.     Findings: No bruising, erythema, lesion or rash.  Neurological:     General: No focal deficit present.     Mental Status: He is alert and oriented to person, place, and time. Mental status is at baseline.  Psychiatric:        Mood and Affect: Mood normal.        Behavior: Behavior normal.        Thought Content: Thought content normal.        Judgment: Judgment normal.    Results for orders placed or performed in visit on 10/31/20  Comprehensive metabolic panel  Result Value Ref Range   Glucose 92 65 - 99 mg/dL   BUN 19 6 - 20 mg/dL   Creatinine, Ser 1.04 0.76 - 1.27 mg/dL   eGFR 94 >59 mL/min/1.73   BUN/Creatinine Ratio 18 9 - 20   Sodium 143 134 - 144 mmol/L   Potassium 4.5 3.5 - 5.2 mmol/L   Chloride 103 96 - 106 mmol/L   CO2 27 20 - 29 mmol/L   Calcium 10.2 8.7 - 10.2 mg/dL   Total Protein 7.4 6.0 - 8.5 g/dL   Albumin 5.2 (H) 4.0 - 5.0 g/dL   Globulin, Total 2.2 1.5 - 4.5 g/dL   Albumin/Globulin Ratio 2.4 (H) 1.2 - 2.2   Bilirubin Total 0.3 0.0 - 1.2 mg/dL   Alkaline Phosphatase 65 44 - 121  IU/L   AST 32 0 - 40 IU/L   ALT 40 0 -  44 IU/L  Lipid Panel w/o Chol/HDL Ratio  Result Value Ref Range   Cholesterol, Total 214 (H) 100 - 199 mg/dL   Triglycerides 170 (H) 0 - 149 mg/dL   HDL 57 >39 mg/dL   VLDL Cholesterol Cal 30 5 - 40 mg/dL   LDL Chol Calc (NIH) 127 (H) 0 - 99 mg/dL      Assessment & Plan:   Problem List Items Addressed This Visit       Other   Depression    Under good control on current regimen. Continue current regimen. Continue to monitor. Call with any concerns. Refills given.        Relevant Medications   PARoxetine (PAXIL) 40 MG tablet   Hyperlipidemia - Primary    Under good control on current regimen. Continue current regimen. Continue to monitor. Call with any concerns. Refills given. Labs drawn today.       Relevant Medications   rosuvastatin (CRESTOR) 20 MG tablet   Other Relevant Orders   Comprehensive metabolic panel   Lipid Panel w/o Chol/HDL Ratio   Chronic daily headache    Does not want to change medicine at this time. Discussed effexor- will consider if getting worse. Will continue zofran. Call with any concerns.       Relevant Medications   PARoxetine (PAXIL) 40 MG tablet   ibuprofen (ADVIL) 800 MG tablet     Follow up plan: Return in about 6 months (around 01/26/2022) for physical.

## 2021-07-29 NOTE — Assessment & Plan Note (Signed)
Under good control on current regimen. Continue current regimen. Continue to monitor. Call with any concerns. Refills given.   

## 2021-07-30 LAB — LIPID PANEL W/O CHOL/HDL RATIO
Cholesterol, Total: 188 mg/dL (ref 100–199)
HDL: 65 mg/dL (ref >39)
LDL Chol Calc (NIH): 111 mg/dL — ABNORMAL HIGH (ref 0–99)
Triglycerides: 65 mg/dL (ref 0–149)
VLDL Cholesterol Cal: 12 mg/dL (ref 5–40)

## 2021-07-30 LAB — COMPREHENSIVE METABOLIC PANEL
ALT: 31 IU/L (ref 0–44)
AST: 30 IU/L (ref 0–40)
Albumin/Globulin Ratio: 2 (ref 1.2–2.2)
Albumin: 4.8 g/dL (ref 4.0–5.0)
Alkaline Phosphatase: 61 IU/L (ref 44–121)
BUN/Creatinine Ratio: 20 (ref 9–20)
BUN: 19 mg/dL (ref 6–24)
Bilirubin Total: 0.6 mg/dL (ref 0.0–1.2)
CO2: 26 mmol/L (ref 20–29)
Calcium: 9.7 mg/dL (ref 8.7–10.2)
Chloride: 100 mmol/L (ref 96–106)
Creatinine, Ser: 0.97 mg/dL (ref 0.76–1.27)
Globulin, Total: 2.4 g/dL (ref 1.5–4.5)
Glucose: 103 mg/dL — ABNORMAL HIGH (ref 70–99)
Potassium: 4.3 mmol/L (ref 3.5–5.2)
Sodium: 140 mmol/L (ref 134–144)
Total Protein: 7.2 g/dL (ref 6.0–8.5)
eGFR: 101 mL/min/{1.73_m2} (ref >59)

## 2021-08-27 NOTE — Progress Notes (Signed)
? ?BP (!) 153/85   Pulse 60   Temp 98.1 ?F (36.7 ?C) (Oral)   Wt 178 lb (80.7 kg)   SpO2 96%   BMI 26.29 kg/m?   ? ?Subjective:  ? ? Patient ID: Jacob Key, male    DOB: 21-Nov-1980, 41 y.o.   MRN: [id number] ? ?HPI: ?Jacob Key is a 41 y.o. male ? ?Chief Complaint  ?Patient presents with  ? URI  ?  Pt states he has been having a runny nose, cough, drainage, sore throat, and ear pain since Saturday. States he has not had a fever, no body aches, no chills.   ? ?UPPER RESPIRATORY TRACT INFECTION ?Worst symptom: Patient states symptoms have been going on since last Saturday ?Fever: no ?Cough: yes ?Shortness of breath: no ?Wheezing: no ?Chest pain: no ?Chest tightness: no ?Chest congestion: yes ?Nasal congestion: yes ?Runny nose: yes ?Post nasal drip: no ?Sneezing: no ?Sore throat: yes ?Swollen glands: yes ?Sinus pressure: no ?Headache: no ?Face pain: no ?Toothache: no ?Ear pain: yes bilateral ?Ear pressure: no bilateral ?Eyes red/itching:no ?Eye drainage/crusting: no  ?Vomiting: no ?Rash: no ?Fatigue: no ?Sick contacts: no ?Strep contacts: no  ?Context: fluctuating ?Recurrent sinusitis: no ?Relief with OTC cold/cough medications:  some   ?Treatments attempted: cold/sinus and mucinex  ? ?Relevant past medical, surgical, family and social history reviewed and updated as indicated. Interim medical history since our last visit reviewed. ?Allergies and medications reviewed and updated. ? ?Review of Systems  ?Constitutional:  Negative for fatigue and fever.  ?HENT:  Positive for congestion, ear pain, rhinorrhea and sore throat. Negative for postnasal drip, sinus pressure, sinus pain and sneezing.   ?Respiratory:  Positive for cough. Negative for chest tightness, shortness of breath and wheezing.   ?Gastrointestinal:  Negative for vomiting.  ?Skin:  Negative for rash.  ?Neurological:  Negative for headaches.  ? ?Per HPI unless specifically indicated above ? ?   ?Objective:  ?  ?BP (!) 153/85   Pulse 60   Temp  98.1 ?F (36.7 ?C) (Oral)   Wt 178 lb (80.7 kg)   SpO2 96%   BMI 26.29 kg/m?   ?Wt Readings from Last 3 Encounters:  ?08/28/21 178 lb (80.7 kg)  ?07/29/21 174 lb 6.4 oz (79.1 kg)  ?03/09/21 186 lb (84.4 kg)  ?  ?Physical Exam ?Vitals and nursing note reviewed.  ?Constitutional:   ?   General: He is not in acute distress. ?   Appearance: Normal appearance. He is not ill-appearing, toxic-appearing or diaphoretic.  ?HENT:  ?   Head: Normocephalic.  ?   Right Ear: External ear normal. A middle ear effusion is present. Tympanic membrane is not erythematous.  ?   Left Ear: External ear normal. A middle ear effusion is present. Tympanic membrane is not erythematous.  ?   Nose: Nose normal. No congestion or rhinorrhea.  ?   Mouth/Throat:  ?   Mouth: Mucous membranes are moist.  ?   Pharynx: Posterior oropharyngeal erythema present. No oropharyngeal exudate.  ?   Tonsils: No tonsillar exudate or tonsillar abscesses. 1+ on the right. 0 on the left.  ?Eyes:  ?   General:     ?   Right eye: No discharge.     ?   Left eye: No discharge.  ?   Extraocular Movements: Extraocular movements intact.  ?   Conjunctiva/sclera: Conjunctivae normal.  ?   Pupils: Pupils are equal, round, and reactive to light.  ?Cardiovascular:  ?   Rate and  Rhythm: Normal rate and regular rhythm.  ?   Heart sounds: No murmur heard. ?Pulmonary:  ?   Effort: Pulmonary effort is normal. No respiratory distress.  ?   Breath sounds: Normal breath sounds. No wheezing, rhonchi or rales.  ?Abdominal:  ?   General: Abdomen is flat. Bowel sounds are normal.  ?Musculoskeletal:  ?   Cervical back: Normal range of motion and neck supple.  ?Skin: ?   General: Skin is warm and dry.  ?   Capillary Refill: Capillary refill takes less than 2 seconds.  ?Neurological:  ?   General: No focal deficit present.  ?   Mental Status: He is alert and oriented to person, place, and time.  ?Psychiatric:     ?   Mood and Affect: Mood normal.     ?   Behavior: Behavior normal.     ?    Thought Content: Thought content normal.     ?   Judgment: Judgment normal.  ? ? ?Results for orders placed or performed in visit on 07/29/21  ?Comprehensive metabolic panel  ?Result Value Ref Range  ? Glucose 103 (H) 70 - 99 mg/dL  ? BUN 19 6 - 24 mg/dL  ? Creatinine, Ser 0.97 0.76 - 1.27 mg/dL  ? eGFR 101 >59 mL/min/1.73  ? BUN/Creatinine Ratio 20 9 - 20  ? Sodium 140 134 - 144 mmol/L  ? Potassium 4.3 3.5 - 5.2 mmol/L  ? Chloride 100 96 - 106 mmol/L  ? CO2 26 20 - 29 mmol/L  ? Calcium 9.7 8.7 - 10.2 mg/dL  ? Total Protein 7.2 6.0 - 8.5 g/dL  ? Albumin 4.8 4.0 - 5.0 g/dL  ? Globulin, Total 2.4 1.5 - 4.5 g/dL  ? Albumin/Globulin Ratio 2.0 1.2 - 2.2  ? Bilirubin Total 0.6 0.0 - 1.2 mg/dL  ? Alkaline Phosphatase 61 44 - 121 IU/L  ? AST 30 0 - 40 IU/L  ? ALT 31 0 - 44 IU/L  ?Lipid Panel w/o Chol/HDL Ratio  ?Result Value Ref Range  ? Cholesterol, Total 188 100 - 199 mg/dL  ? Triglycerides 65 0 - 149 mg/dL  ? HDL 65 >39 mg/dL  ? VLDL Cholesterol Cal 12 5 - 40 mg/dL  ? LDL Chol Calc (NIH) 111 (H) 0 - 99 mg/dL  ? ?   ?Assessment & Plan:  ? ?Problem List Items Addressed This Visit   ?None ?Visit Diagnoses   ? ? Viral upper respiratory tract infection    -  Primary  ? Symptoms started 6 days ago. Improving. Will treat with steroids. Continue with OTC symptom managment.  Follow up if symptoms worsen or fail to improve.  ? ?  ?  ? ?Follow up plan: ?Return if symptoms worsen or fail to improve. ? ? ? ? ? ?

## 2021-08-28 ENCOUNTER — Ambulatory Visit: Payer: BC Managed Care – PPO | Admitting: Nurse Practitioner

## 2021-08-28 ENCOUNTER — Other Ambulatory Visit: Payer: Self-pay

## 2021-08-28 ENCOUNTER — Encounter: Payer: Self-pay | Admitting: Nurse Practitioner

## 2021-08-28 VITALS — BP 153/85 | HR 60 | Temp 98.1°F | Wt 178.0 lb

## 2021-08-28 DIAGNOSIS — J069 Acute upper respiratory infection, unspecified: Secondary | ICD-10-CM

## 2021-08-28 MED ORDER — METHYLPREDNISOLONE 4 MG PO TBPK: ORAL_TABLET | ORAL | 0 refills | Status: DC

## 2021-10-07 ENCOUNTER — Ambulatory Visit: Payer: Self-pay | Admitting: *Deleted

## 2021-10-07 NOTE — Telephone Encounter (Signed)
?  Chief Complaint: Dog bite ?Symptoms: Dog bite (pet) right hand middle finger at knuckle.Occurred 11 days ago, still swollen "But much better."  Painful last night, not presently,  new symptom. ?Frequency: 11 days ago ?Pertinent Negatives: Patient denies redness, warmth to site, fever. Tetanus up to date. ?Disposition: [] ED /[] Urgent Care (no appt availability in office) / [x] Appointment(In office/virtual)/ []  [facility]/ [] Home Care/ [] Refused Recommended Disposition /[] [facility]/ []  Follow-up with PCP ?Additional Notes: Appt secured for tomorrow. Advised ED for worsening symptoms. Pt verbalizes understanding.  ?Reason for Disposition ? [1] Taking antibiotic > 24 hours for infected bite AND [2] bite getting worse (more swollen, more redness and increased pain) ?   Not taking antibiotic, occurred 11 days ago. Still swollen, painful ? ?Answer Assessment - Initial Assessment Questions ?1. ANIMAL: "What type of animal caused the bite?" "Is the injury from a bite or a claw?" If the animal is a dog or a cat, ask: "Was it a pet or a stray?" "Was it acting ill or behaving strangely?" ?    Dog bite, pet ?2. LOCATION: "Where is the bite located?"  ?    Middle finger right hand ?3. SIZE: "How big is the bite?" "What does it look like?"  ?    Closed now ?4. ONSET: "When did the bite happen?" (Minutes or hours ago)  ?    11 days ago ?5. CIRCUMSTANCES: "Tell me how this happened."  ?    Dog bite ?6. TETANUS: "When was the last tetanus booster?" ?    "Not long ago, 1-2 months ago." ? ?Protocols used: Animal Bite-A-AH ? ?

## 2021-10-08 ENCOUNTER — Ambulatory Visit (INDEPENDENT_AMBULATORY_CARE_PROVIDER_SITE_OTHER): Payer: BC Managed Care – PPO | Admitting: Family Medicine

## 2021-10-08 ENCOUNTER — Encounter: Payer: Self-pay | Admitting: Family Medicine

## 2021-10-08 VITALS — BP 136/84 | HR 60 | Temp 97.7°F | Wt 178.2 lb

## 2021-10-08 DIAGNOSIS — W540XXA Bitten by dog, initial encounter: Secondary | ICD-10-CM

## 2021-10-08 DIAGNOSIS — L03011 Cellulitis of right finger: Secondary | ICD-10-CM | POA: Diagnosis not present

## 2021-10-08 MED ORDER — AMOXICILLIN-POT CLAVULANATE 875-125 MG PO TABS: 1.0000 | ORAL_TABLET | Freq: Two times a day (BID) | ORAL | 0 refills | Status: DC

## 2021-10-08 NOTE — Progress Notes (Signed)
? ?BP 136/84   Pulse 60   Temp 97.7 ?F (36.5 ?C)   Wt 178 lb 3.2 oz (80.8 kg)   SpO2 98%   BMI 26.32 kg/m?   ? ?Subjective:  ? ? Patient ID: Jacob Key, male    DOB: 1981/02/26, 41 y.o.   MRN: [id number] ? ?HPI: ?[name] is a 41 y.o. male ? ?Chief Complaint  ?Patient presents with  ? Animal Bite  ?  Patient states he got bit by a dog 12 days ago on his right hand. Patient middle finger is still swollen   ? ?SKIN INFECTION ?Duration: 12 days ?Location: R middle finger ?History of trauma in area: yes- bit by a dog 12 days ago, didn't see anyone ?Pain: yes ?Quality: tight and sore ?Severity: moderate ?Redness: yes ?Swelling: yes ?Oozing: no ?Pus: no ?Fevers: no ?Nausea/vomiting: no ?Status: stable ?Treatments attempted:none  ?Tetanus: UTD ? ?Relevant past medical, surgical, family and social history reviewed and updated as indicated. Interim medical history since our last visit reviewed. ?Allergies and medications reviewed and updated. ? ?Review of Systems  ?Constitutional: Negative.   ?Respiratory: Negative.    ?Cardiovascular: Negative.   ?Gastrointestinal: Negative.   ?Musculoskeletal: Negative.   ?Skin:  Negative for color change, pallor, rash and wound.  ?Psychiatric/Behavioral: Negative.    ? ?Per HPI unless specifically indicated above ? ?   ?Objective:  ?  ?BP 136/84   Pulse 60   Temp 97.7 ?F (36.5 ?C)   Wt 178 lb 3.2 oz (80.8 kg)   SpO2 98%   BMI 26.32 kg/m?   ?Wt Readings from Last 3 Encounters:  ?10/08/21 178 lb 3.2 oz (80.8 kg)  ?08/28/21 178 lb (80.7 kg)  ?07/29/21 174 lb 6.4 oz (79.1 kg)  ?  ?Physical Exam ?Vitals and nursing note reviewed.  ?Constitutional:   ?   General: He is not in acute distress. ?   Appearance: Normal appearance. He is not ill-appearing, toxic-appearing or diaphoretic.  ?HENT:  ?   Head: Normocephalic and atraumatic.  ?   Right Ear: External ear normal.  ?   Left Ear: External ear normal.  ?   Nose: Nose normal.  ?   Mouth/Throat:  ?   Mouth: Mucous membranes  are moist.  ?   Pharynx: Oropharynx is clear.  ?Eyes:  ?   General: No scleral icterus.    ?   Right eye: No discharge.     ?   Left eye: No discharge.  ?   Extraocular Movements: Extraocular movements intact.  ?   Conjunctiva/sclera: Conjunctivae normal.  ?   Pupils: Pupils are equal, round, and reactive to light.  ?Cardiovascular:  ?   Rate and Rhythm: Normal rate and regular rhythm.  ?   Pulses: Normal pulses.  ?   Heart sounds: Normal heart sounds. No murmur heard. ?  No friction rub. No gallop.  ?Pulmonary:  ?   Effort: Pulmonary effort is normal. No respiratory distress.  ?   Breath sounds: Normal breath sounds. No stridor. No wheezing, rhonchi or rales.  ?Chest:  ?   Chest wall: No tenderness.  ?Musculoskeletal:     ?   General: Swelling and tenderness (R middle finger) present. Normal range of motion.  ?   Cervical back: Normal range of motion and neck supple.  ?Skin: ?   General: Skin is warm and dry.  ?   Capillary Refill: Capillary refill takes less than 2 seconds.  ?   Coloration: Skin is  not jaundiced or pale.  ?   Findings: No bruising, erythema, lesion or rash.  ?Neurological:  ?   General: No focal deficit present.  ?   Mental Status: He is alert and oriented to person, place, and time. Mental status is at baseline.  ?Psychiatric:     ?   Mood and Affect: Mood normal.     ?   Behavior: Behavior normal.     ?   Thought Content: Thought content normal.     ?   Judgment: Judgment normal.  ? ? ?Results for orders placed or performed in visit on 07/29/21  ?Comprehensive metabolic panel  ?Result Value Ref Range  ? Glucose 103 (H) 70 - 99 mg/dL  ? BUN 19 6 - 24 mg/dL  ? Creatinine, Ser 0.97 0.76 - 1.27 mg/dL  ? eGFR 101 >59 mL/min/1.73  ? BUN/Creatinine Ratio 20 9 - 20  ? Sodium 140 134 - 144 mmol/L  ? Potassium 4.3 3.5 - 5.2 mmol/L  ? Chloride 100 96 - 106 mmol/L  ? CO2 26 20 - 29 mmol/L  ? Calcium 9.7 8.7 - 10.2 mg/dL  ? Total Protein 7.2 6.0 - 8.5 g/dL  ? Albumin 4.8 4.0 - 5.0 g/dL  ? Globulin, Total 2.4  1.5 - 4.5 g/dL  ? Albumin/Globulin Ratio 2.0 1.2 - 2.2  ? Bilirubin Total 0.6 0.0 - 1.2 mg/dL  ? Alkaline Phosphatase 61 44 - 121 IU/L  ? AST 30 0 - 40 IU/L  ? ALT 31 0 - 44 IU/L  ?Lipid Panel w/o Chol/HDL Ratio  ?Result Value Ref Range  ? Cholesterol, Total 188 100 - 199 mg/dL  ? Triglycerides 65 0 - 149 mg/dL  ? HDL 65 >39 mg/dL  ? VLDL Cholesterol Cal 12 5 - 40 mg/dL  ? LDL Chol Calc (NIH) 111 (H) 0 - 99 mg/dL  ? ?   ?Assessment & Plan:  ? ?Problem List Items Addressed This Visit   ?None ?Visit Diagnoses   ? ? Cellulitis of finger of right hand    -  Primary  ? Will treat with augmentin. Call if not getting better by tomorrow and we'll get him into surgery to evaluate for deeper infection due to dog bite.   ? Dog bite, initial encounter      ? UTD on Tdap. Will treat with augmentin.   ? ?  ?  ? ?Follow up plan: ?Return if symptoms worsen or fail to improve. ? ? ? ? ? ?

## 2021-10-09 ENCOUNTER — Telehealth: Payer: Self-pay | Admitting: Family Medicine

## 2021-10-09 DIAGNOSIS — L03011 Cellulitis of right finger: Secondary | ICD-10-CM

## 2021-10-09 DIAGNOSIS — S61252A Open bite of right middle finger without damage to nail, initial encounter: Secondary | ICD-10-CM | POA: Diagnosis not present

## 2021-10-09 DIAGNOSIS — W540XXA Bitten by dog, initial encounter: Secondary | ICD-10-CM

## 2021-10-09 NOTE — Telephone Encounter (Signed)
Please have him go to the [facility] and the hand specialist is going to see him today. They say he might have a little wait but they are expecting him. [address], [place], [place] [address] ?

## 2021-10-09 NOTE — Telephone Encounter (Signed)
Patient is aware of urgent referral being placed.  ?

## 2021-10-09 NOTE — Telephone Encounter (Addendum)
Pt calling back b/c he has not heard anything back. ?Pt is anxious to hear back.  Informed him you are at lunch and I will send high priority message. ?The message from this morning was never sent. ?

## 2021-10-09 NOTE — Telephone Encounter (Signed)
Routing to provider to advise patient.

## 2021-10-09 NOTE — Telephone Encounter (Signed)
Attempted to contact patient NA/LVM wth provider advise.  ?

## 2021-10-09 NOTE — Addendum Note (Signed)
Addended by: Valerie Roys on: 10/09/2021 01:47 PM ? ? Modules accepted: Orders ? ?

## 2021-10-09 NOTE — Telephone Encounter (Signed)
Just got this message, urgent referral placed. [facility] aware and working on it ?

## 2021-10-09 NOTE — Telephone Encounter (Signed)
Copied from CRM [id number]. Topic: General - Other ?>> Oct 09, 2021 10:00 AM Jaquita Rector A wrote: ?Reason for CRM: Patient called in to inform Dr Laural Benes that his hand have gotten worst and needing to know how soon he can go in to see the surgeon that was discussed at his visit. Asking for a call back ASAP at Ph# [phone] ?

## 2022-01-26 ENCOUNTER — Encounter: Payer: Self-pay | Admitting: Family Medicine

## 2022-01-26 ENCOUNTER — Ambulatory Visit (INDEPENDENT_AMBULATORY_CARE_PROVIDER_SITE_OTHER): Payer: BC Managed Care – PPO | Admitting: Family Medicine

## 2022-01-26 VITALS — BP 122/69 | HR 67 | Temp 98.3°F | Ht 70.0 in | Wt 170.8 lb

## 2022-01-26 DIAGNOSIS — F3342 Major depressive disorder, recurrent, in full remission: Secondary | ICD-10-CM | POA: Diagnosis not present

## 2022-01-26 DIAGNOSIS — Z0000 Encounter for general adult medical examination without abnormal findings: Secondary | ICD-10-CM

## 2022-01-26 DIAGNOSIS — R519 Headache, unspecified: Secondary | ICD-10-CM | POA: Diagnosis not present

## 2022-01-26 DIAGNOSIS — Z136 Encounter for screening for cardiovascular disorders: Secondary | ICD-10-CM | POA: Diagnosis not present

## 2022-01-26 DIAGNOSIS — E782 Mixed hyperlipidemia: Secondary | ICD-10-CM | POA: Diagnosis not present

## 2022-01-26 LAB — URINALYSIS, ROUTINE W REFLEX MICROSCOPIC
Bilirubin, UA: NEGATIVE
Glucose, UA: NEGATIVE
Ketones, UA: NEGATIVE
Leukocytes,UA: NEGATIVE
Nitrite, UA: NEGATIVE
Protein,UA: NEGATIVE
RBC, UA: NEGATIVE
Specific Gravity, UA: >1.03 — ABNORMAL HIGH (ref 1.005–1.030)
Urobilinogen, Ur: 0.2 mg/dL (ref 0.2–1.0)
pH, UA: 6 (ref 5.0–7.5)

## 2022-01-26 MED ORDER — IBUPROFEN 800 MG PO TABS: 800.0000 mg | ORAL_TABLET | Freq: Three times a day (TID) | ORAL | 1 refills | Status: DC | PRN

## 2022-01-26 MED ORDER — ROSUVASTATIN CALCIUM 20 MG PO TABS: 20.0000 mg | ORAL_TABLET | Freq: Every day | ORAL | 1 refills | Status: DC

## 2022-01-26 MED ORDER — PAROXETINE HCL 40 MG PO TABS: 40.0000 mg | ORAL_TABLET | Freq: Every day | ORAL | 1 refills | Status: DC

## 2022-01-26 MED ORDER — ONDANSETRON 4 MG PO TBDP: ORAL_TABLET | ORAL | 6 refills | Status: DC

## 2022-01-26 NOTE — Progress Notes (Signed)
BP 122/69   Pulse 67   Temp 98.3 F (36.8 C)   Ht '5\' 10"'  (1.778 m)   Wt 170 lb 12.8 oz (77.5 kg)   SpO2 98%   BMI 24.51 kg/m    Subjective:    Patient ID: Jacob Key, male    DOB: August 18, 1980, 41 y.o.   MRN: [id number]  HPI: Jacob Key is a 41 y.o. male presenting on 01/26/2022 for comprehensive medical examination. Current medical complaints include:  DEPRESSION Mood status: controlled Satisfied with current treatment?: yes Symptom severity: mild  Duration of current treatment : chronic Side effects: no Medication compliance: excellent compliance Previous psychiatric medications: paxil Depressed mood: no Anxious mood: no Anhedonia: no Significant weight loss or gain: no Insomnia: no  Fatigue: no Feelings of worthlessness or guilt: no Impaired concentration/indecisiveness: no Suicidal ideations: no Hopelessness: no Crying spells: no    01/26/2022    1:05 PM 10/08/2021    8:12 AM 08/28/2021    9:06 AM 07/29/2021   11:12 AM 03/02/2021    9:38 AM  Depression screen PHQ 2/9  Decreased Interest 0 0 0 0 0  Down, Depressed, Hopeless 1 1 0 0 0  PHQ - 2 Score 1 1 0 0 0  Altered sleeping 0 0 1 1 0  Tired, decreased energy 0 0 0 0 0  Change in appetite 0 0 0 0 0  Feeling bad or failure about yourself  0 0 0 0 0  Trouble concentrating 0 0 0 0 0  Moving slowly or fidgety/restless 0 0 0 0 0  Suicidal thoughts 0 0 0 0 0  PHQ-9 Score '1 1 1 1 ' 0  Difficult doing work/chores Not difficult at all  Not difficult at all  Not difficult at all    HYPERLIPIDEMIA Hyperlipidemia status: excellent compliance Satisfied with current treatment?  yes Side effects:  no Medication compliance: excellent compliance Past cholesterol meds: crestor Supplements: none Aspirin:  no The 10-year ASCVD risk score (Arnett DK, et al., 2019) is: 2.3%   Values used to calculate the score:     Age: 20 years     Sex: Male     Is Non-Hispanic African American: No     Diabetic: No     Tobacco  smoker: Yes     Systolic Blood Pressure: 364 mmHg     Is BP treated: No     HDL Cholesterol: 65 mg/dL     Total Cholesterol: 188 mg/dL Chest pain:  no Coronary artery disease:  no  Interim Problems from his last visit: no  Depression Screen done today and results listed below:     01/26/2022    1:05 PM 10/08/2021    8:12 AM 08/28/2021    9:06 AM 07/29/2021   11:12 AM 03/02/2021    9:38 AM  Depression screen PHQ 2/9  Decreased Interest 0 0 0 0 0  Down, Depressed, Hopeless 1 1 0 0 0  PHQ - 2 Score 1 1 0 0 0  Altered sleeping 0 0 1 1 0  Tired, decreased energy 0 0 0 0 0  Change in appetite 0 0 0 0 0  Feeling bad or failure about yourself  0 0 0 0 0  Trouble concentrating 0 0 0 0 0  Moving slowly or fidgety/restless 0 0 0 0 0  Suicidal thoughts 0 0 0 0 0  PHQ-9 Score '1 1 1 1 ' 0  Difficult doing work/chores Not difficult at all  Not difficult at  all  Not difficult at all    Past Medical History:  Past Medical History:  Diagnosis Date   Depression     Surgical History:  Past Surgical History:  Procedure Laterality Date   FACIAL RECONSTRUCTION SURGERY     Lower face   TONSILLECTOMY      Medications:  No current outpatient medications on file prior to visit.   No current facility-administered medications on file prior to visit.    Allergies:  No Known Allergies  Social History:  Social History   Socioeconomic History   Marital status: Single    Spouse name: Not on file   Number of children: Not on file   Years of education: Not on file   Highest education level: Not on file  Occupational History   Not on file  Tobacco Use   Smoking status: Former    Packs/day: 0.50    Years: 0.50    Total pack years: 0.25    Types: Cigarettes, E-cigarettes    Start date: 01/18/2015   Smokeless tobacco: Never  Vaping Use   Vaping Use: Every day  Substance and Sexual Activity   Alcohol use: Yes    Alcohol/week: 20.0 standard drinks of alcohol    Types: 20 Cans of beer per  week    Comment: moderate   Drug use: No   Sexual activity: Yes  Other Topics Concern   Not on file  Social History Narrative   Not on file   Social Determinants of Health   Financial Resource Strain: Not on file  Food Insecurity: Not on file  Transportation Needs: Not on file  Physical Activity: Not on file  Stress: Not on file  Social Connections: Not on file  Intimate Partner Violence: Not on file   Social History   Tobacco Use  Smoking Status Former   Packs/day: 0.50   Years: 0.50   Total pack years: 0.25   Types: Cigarettes, E-cigarettes   Start date: 01/18/2015  Smokeless Tobacco Never   Social History   Substance and Sexual Activity  Alcohol Use Yes   Alcohol/week: 20.0 standard drinks of alcohol   Types: 20 Cans of beer per week   Comment: moderate    Family History:  Family History  Problem Relation Age of Onset   Hyperlipidemia Mother    Hyperlipidemia Father    Alcohol abuse Maternal Grandmother    Heart disease Maternal Grandmother    Heart disease Maternal Grandfather    Alzheimer's disease Paternal Grandfather     Past medical history, surgical history, medications, allergies, family history and social history reviewed with patient today and changes made to appropriate areas of the chart.   Review of Systems  Constitutional: Negative.   HENT: Negative.    Eyes: Negative.   Respiratory: Negative.    Cardiovascular: Negative.   Gastrointestinal:  Positive for heartburn (with food choices, better with tums). Negative for abdominal pain, blood in stool, constipation, diarrhea, melena, nausea and vomiting.  Genitourinary: Negative.   Musculoskeletal: Negative.   Skin: Negative.   Neurological:  Positive for dizziness and headaches. Negative for tingling, tremors, sensory change, speech change, focal weakness, seizures, loss of consciousness and weakness.  Endo/Heme/Allergies:  Positive for environmental allergies. Negative for polydipsia. Does not  bruise/bleed easily.  Psychiatric/Behavioral: Negative.     All other ROS negative except what is listed above and in the HPI.      Objective:    BP 122/69   Pulse 67   Temp  98.3 F (36.8 C)   Ht '5\' 10"'  (1.778 m)   Wt 170 lb 12.8 oz (77.5 kg)   SpO2 98%   BMI 24.51 kg/m   Wt Readings from Last 3 Encounters:  01/26/22 170 lb 12.8 oz (77.5 kg)  10/08/21 178 lb 3.2 oz (80.8 kg)  08/28/21 178 lb (80.7 kg)    Physical Exam Vitals and nursing note reviewed.  Constitutional:      General: He is not in acute distress.    Appearance: Normal appearance. He is normal weight. He is not ill-appearing, toxic-appearing or diaphoretic.  HENT:     Head: Normocephalic and atraumatic.     Right Ear: Tympanic membrane, ear canal and external ear normal. There is no impacted cerumen.     Left Ear: Tympanic membrane, ear canal and external ear normal. There is no impacted cerumen.     Nose: Nose normal. No congestion or rhinorrhea.     Mouth/Throat:     Mouth: Mucous membranes are moist.     Pharynx: Oropharynx is clear. No oropharyngeal exudate or posterior oropharyngeal erythema.  Eyes:     General: No scleral icterus.       Right eye: No discharge.        Left eye: No discharge.     Extraocular Movements: Extraocular movements intact.     Conjunctiva/sclera: Conjunctivae normal.     Pupils: Pupils are equal, round, and reactive to light.  Neck:     Vascular: No carotid bruit.  Cardiovascular:     Rate and Rhythm: Normal rate and regular rhythm.     Pulses: Normal pulses.     Heart sounds: No murmur heard.    No friction rub. No gallop.  Pulmonary:     Effort: Pulmonary effort is normal. No respiratory distress.     Breath sounds: Normal breath sounds. No stridor. No wheezing, rhonchi or rales.  Chest:     Chest wall: No tenderness.  Abdominal:     General: Abdomen is flat. Bowel sounds are normal. There is no distension.     Palpations: Abdomen is soft. There is no mass.      Tenderness: There is no abdominal tenderness. There is no right CVA tenderness, left CVA tenderness, guarding or rebound.     Hernia: No hernia is present.  Genitourinary:    Comments: Genital exam deferred with shared decision making Musculoskeletal:        General: No swelling, tenderness, deformity or signs of injury.     Cervical back: Normal range of motion and neck supple. No rigidity. No muscular tenderness.     Right lower leg: No edema.     Left lower leg: No edema.  Lymphadenopathy:     Cervical: No cervical adenopathy.  Skin:    General: Skin is warm and dry.     Capillary Refill: Capillary refill takes less than 2 seconds.     Coloration: Skin is not jaundiced or pale.     Findings: No bruising, erythema, lesion or rash.  Neurological:     General: No focal deficit present.     Mental Status: He is alert and oriented to person, place, and time.     Cranial Nerves: No cranial nerve deficit.     Sensory: No sensory deficit.     Motor: No weakness.     Coordination: Coordination normal.     Gait: Gait normal.     Deep Tendon Reflexes: Reflexes normal.  Psychiatric:  Mood and Affect: Mood normal.        Behavior: Behavior normal.        Thought Content: Thought content normal.        Judgment: Judgment normal.     Results for orders placed or performed in visit on 07/29/21  Comprehensive metabolic panel  Result Value Ref Range   Glucose 103 (H) 70 - 99 mg/dL   BUN 19 6 - 24 mg/dL   Creatinine, Ser 0.97 0.76 - 1.27 mg/dL   eGFR 101 >59 mL/min/1.73   BUN/Creatinine Ratio 20 9 - 20   Sodium 140 134 - 144 mmol/L   Potassium 4.3 3.5 - 5.2 mmol/L   Chloride 100 96 - 106 mmol/L   CO2 26 20 - 29 mmol/L   Calcium 9.7 8.7 - 10.2 mg/dL   Total Protein 7.2 6.0 - 8.5 g/dL   Albumin 4.8 4.0 - 5.0 g/dL   Globulin, Total 2.4 1.5 - 4.5 g/dL   Albumin/Globulin Ratio 2.0 1.2 - 2.2   Bilirubin Total 0.6 0.0 - 1.2 mg/dL   Alkaline Phosphatase 61 44 - 121 IU/L   AST 30 0 -  40 IU/L   ALT 31 0 - 44 IU/L  Lipid Panel w/o Chol/HDL Ratio  Result Value Ref Range   Cholesterol, Total 188 100 - 199 mg/dL   Triglycerides 65 0 - 149 mg/dL   HDL 65 >39 mg/dL   VLDL Cholesterol Cal 12 5 - 40 mg/dL   LDL Chol Calc (NIH) 111 (H) 0 - 99 mg/dL      Assessment & Plan:   Problem List Items Addressed This Visit       Other   Depression    Under good control on current regimen. Continue current regimen. Continue to monitor. Call with any concerns. Refills given. Labs drawn today.       Relevant Medications   PARoxetine (PAXIL) 40 MG tablet   Hyperlipidemia    Under good control on current regimen. Continue current regimen. Continue to monitor. Call with any concerns. Refills given. Labs drawn today.      Relevant Medications   rosuvastatin (CRESTOR) 20 MG tablet   Chronic daily headache    Does not want to change to effexor. Will work on hydration. Call if getting worse or not getting better.       Relevant Medications   PARoxetine (PAXIL) 40 MG tablet   ibuprofen (ADVIL) 800 MG tablet   Other Visit Diagnoses     Routine general medical examination at a health care facility    -  Primary   Vaccines up to date. Screening labs checked today. Conitnue diet and exercise. Call with any concerns.    Relevant Orders   Comprehensive metabolic panel   CBC with Differential/Platelet   Lipid Panel w/o Chol/HDL Ratio   TSH   Urinalysis, Routine w reflex microscopic        Discussed aspirin prophylaxis for myocardial infarction prevention and decision was it was not indicated  LABORATORY TESTING:  Health maintenance labs ordered today as discussed above.   IMMUNIZATIONS:   - Tdap: Tetanus vaccination status reviewed: last tetanus booster within 10 years. - Influenza: Postponed to flu season - Pneumovax: Not applicable - Prevnar: Not applicable - COVID: Refused - HPV: Not applicable - Shingrix vaccine: Not applicable  PATIENT COUNSELING:    Sexuality:  Discussed sexually transmitted diseases, partner selection, use of condoms, avoidance of unintended pregnancy  and contraceptive alternatives.   Advised to avoid  cigarette smoking.  I discussed with the patient that most people either abstain from alcohol or drink within safe limits (<=14/week and <=4 drinks/occasion for males, <=7/weeks and <= 3 drinks/occasion for females) and that the risk for alcohol disorders and other health effects rises proportionally with the number of drinks per week and how often a drinker exceeds daily limits.  Discussed cessation/primary prevention of drug use and availability of treatment for abuse.   Diet: Encouraged to adjust caloric intake to maintain  or achieve ideal body weight, to reduce intake of dietary saturated fat and total fat, to limit sodium intake by avoiding high sodium foods and not adding table salt, and to maintain adequate dietary potassium and calcium preferably from fresh fruits, vegetables, and low-fat dairy products.    stressed the importance of regular exercise  Injury prevention: Discussed safety belts, safety helmets, smoke detector, smoking near bedding or upholstery.   Dental health: Discussed importance of regular tooth brushing, flossing, and dental visits.   Follow up plan: NEXT PREVENTATIVE PHYSICAL DUE IN 1 YEAR. Return in about 6 months (around 07/29/2022).

## 2022-01-26 NOTE — Assessment & Plan Note (Signed)
Does not want to change to effexor. Will work on hydration. Call if getting worse or not getting better.

## 2022-01-26 NOTE — Assessment & Plan Note (Signed)
Under good control on current regimen. Continue current regimen. Continue to monitor. Call with any concerns. Refills given. Labs drawn today.   

## 2022-01-27 LAB — CBC WITH DIFFERENTIAL/PLATELET
Basophils Absolute: 0 10*3/uL (ref 0.0–0.2)
Basos: 1 %
EOS (ABSOLUTE): 0.1 10*3/uL (ref 0.0–0.4)
Eos: 3 %
Hematocrit: 42 % (ref 37.5–51.0)
Hemoglobin: 14.1 g/dL (ref 13.0–17.7)
Immature Grans (Abs): 0 10*3/uL (ref 0.0–0.1)
Immature Granulocytes: 0 %
Lymphocytes Absolute: 1.2 10*3/uL (ref 0.7–3.1)
Lymphs: 26 %
MCH: 30.5 pg (ref 26.6–33.0)
MCHC: 33.6 g/dL (ref 31.5–35.7)
MCV: 91 fL (ref 79–97)
Monocytes Absolute: 0.3 10*3/uL (ref 0.1–0.9)
Monocytes: 7 %
Neutrophils Absolute: 2.8 10*3/uL (ref 1.4–7.0)
Neutrophils: 63 %
Platelets: 258 10*3/uL (ref 150–450)
RBC: 4.63 x10E6/uL (ref 4.14–5.80)
RDW: 12.3 % (ref 11.6–15.4)
WBC: 4.4 10*3/uL (ref 3.4–10.8)

## 2022-01-27 LAB — COMPREHENSIVE METABOLIC PANEL
ALT: 32 IU/L (ref 0–44)
AST: 27 IU/L (ref 0–40)
Albumin/Globulin Ratio: 1.8 (ref 1.2–2.2)
Albumin: 4.6 g/dL (ref 4.1–5.1)
Alkaline Phosphatase: 53 IU/L (ref 44–121)
BUN/Creatinine Ratio: 18 (ref 9–20)
BUN: 17 mg/dL (ref 6–24)
Bilirubin Total: 0.4 mg/dL (ref 0.0–1.2)
CO2: 25 mmol/L (ref 20–29)
Calcium: 9.7 mg/dL (ref 8.7–10.2)
Chloride: 104 mmol/L (ref 96–106)
Creatinine, Ser: 0.96 mg/dL (ref 0.76–1.27)
Globulin, Total: 2.5 g/dL (ref 1.5–4.5)
Glucose: 72 mg/dL (ref 70–99)
Potassium: 4.3 mmol/L (ref 3.5–5.2)
Sodium: 144 mmol/L (ref 134–144)
Total Protein: 7.1 g/dL (ref 6.0–8.5)
eGFR: 102 mL/min/{1.73_m2} (ref >59)

## 2022-01-27 LAB — LIPID PANEL W/O CHOL/HDL RATIO
Cholesterol, Total: 205 mg/dL — ABNORMAL HIGH (ref 100–199)
HDL: 61 mg/dL (ref >39)
LDL Chol Calc (NIH): 127 mg/dL — ABNORMAL HIGH (ref 0–99)
Triglycerides: 95 mg/dL (ref 0–149)
VLDL Cholesterol Cal: 17 mg/dL (ref 5–40)

## 2022-01-27 LAB — TSH: TSH: 0.948 u[IU]/mL (ref 0.450–4.500)

## 2022-01-31 IMAGING — US US EXTREM LOW VENOUS*L*
1 series · 14 of 24 positions shown · non-contrast
Comparison: None.

CLINICAL DATA: Left calf swelling.

EXAM:
Left LOWER EXTREMITY VENOUS DOPPLER ULTRASOUND
TECHNIQUE: Gray-scale sonography with compression, as well as color and duplex
ultrasound, were performed to evaluate the deep venous system(s)
from the level of the common femoral vein through the popliteal and
proximal calf veins.

[Series 1: us extrem low venous*left* · 0.08mm/px · 14 of 37 slices shown]
[im 1/37]
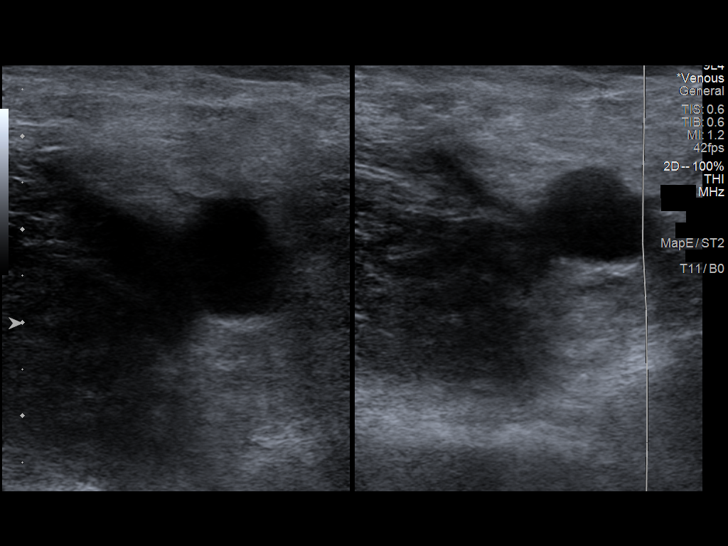
[im 4/37]
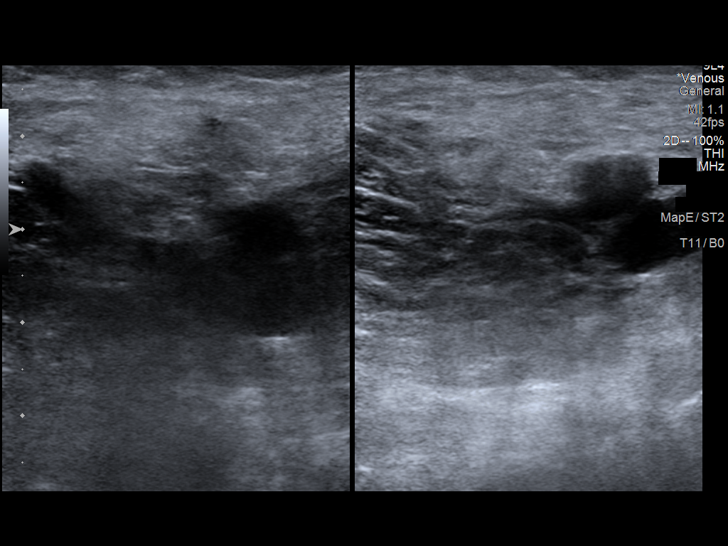
[im 7/37]
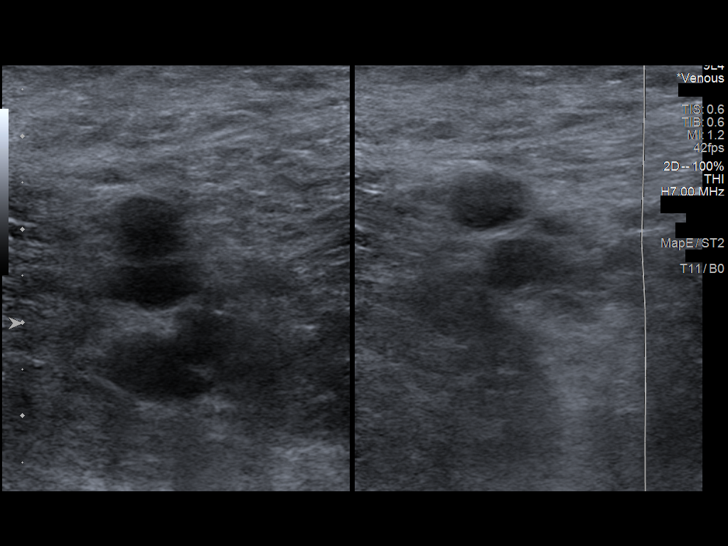
[im 10/37]
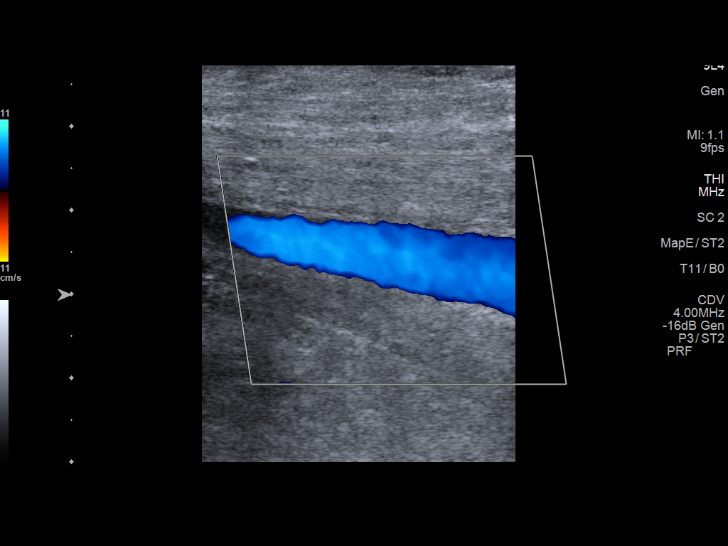
[im 11/37]
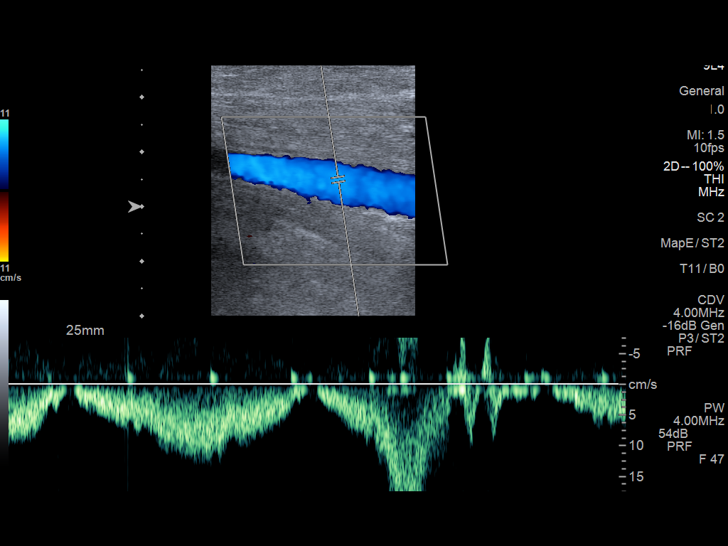
[im 15/37]
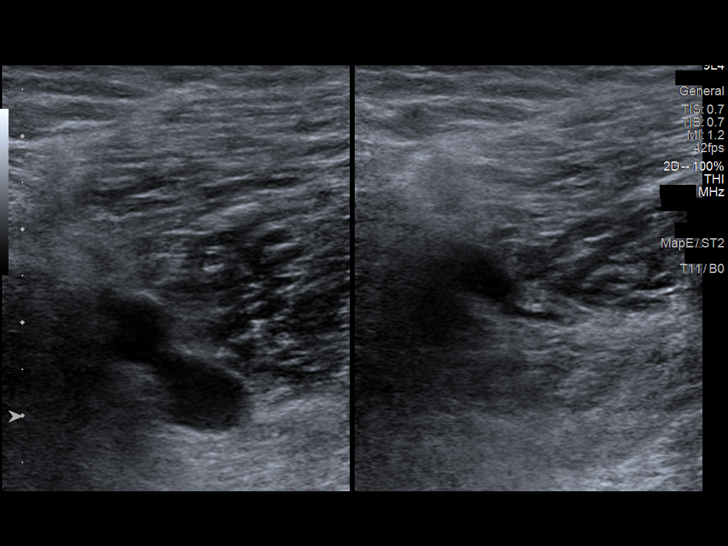
[im 18/37]
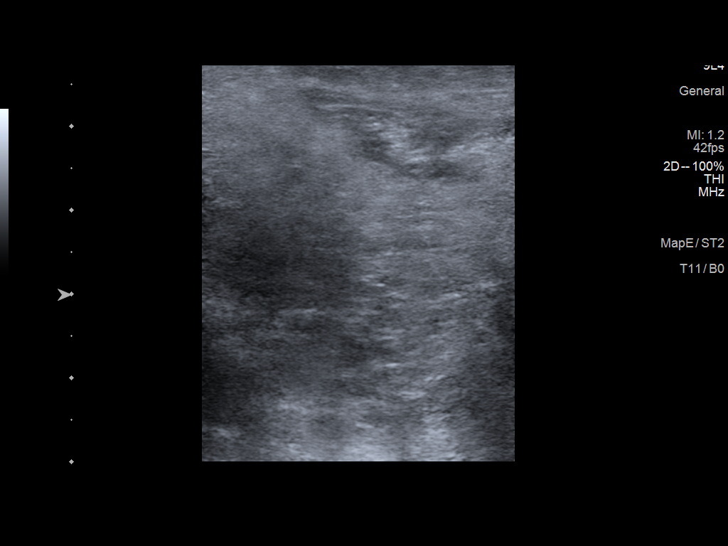
[im 19/37]
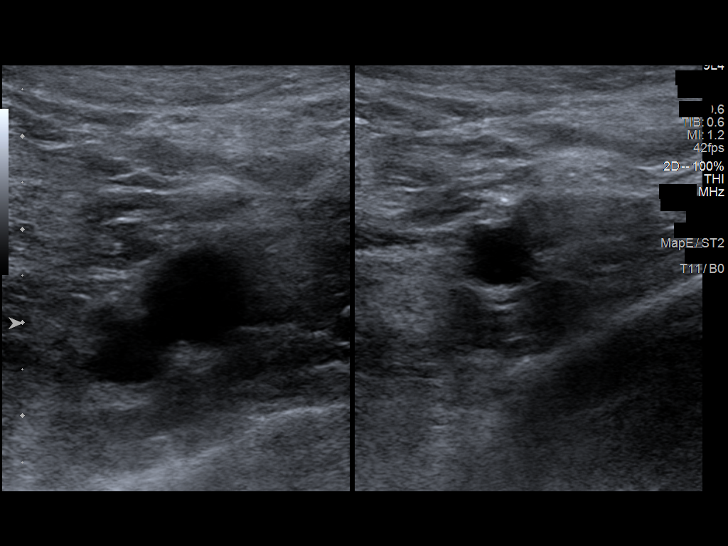
[im 22/37]
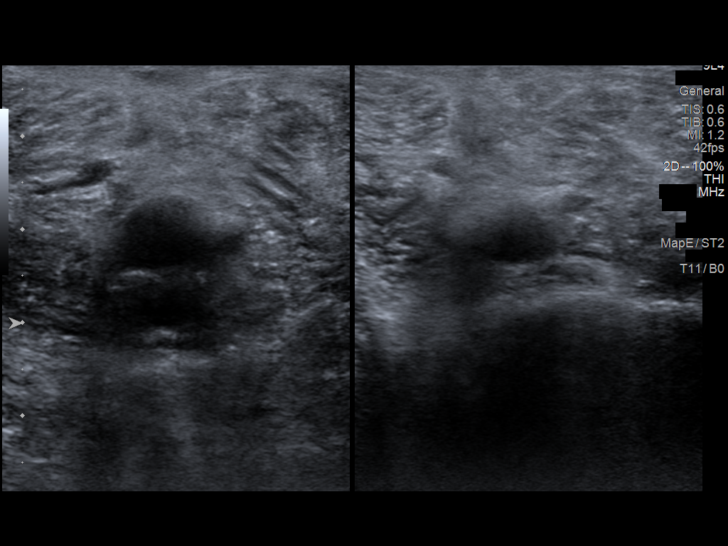
[im 26/37]
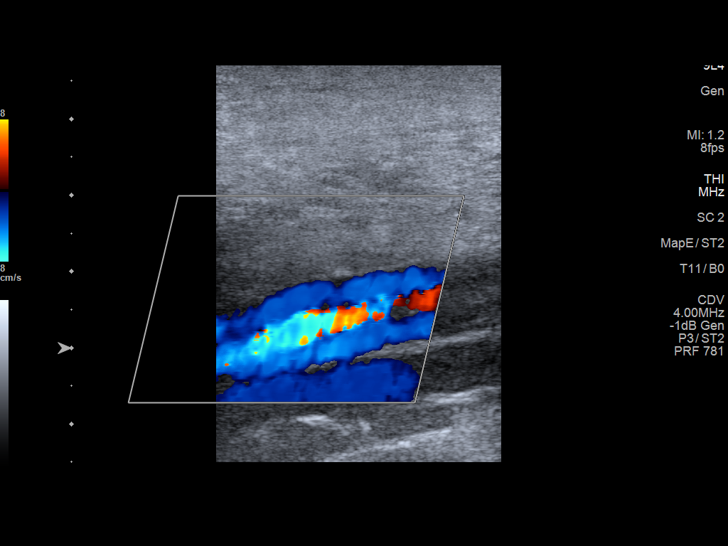
[im 29/37]
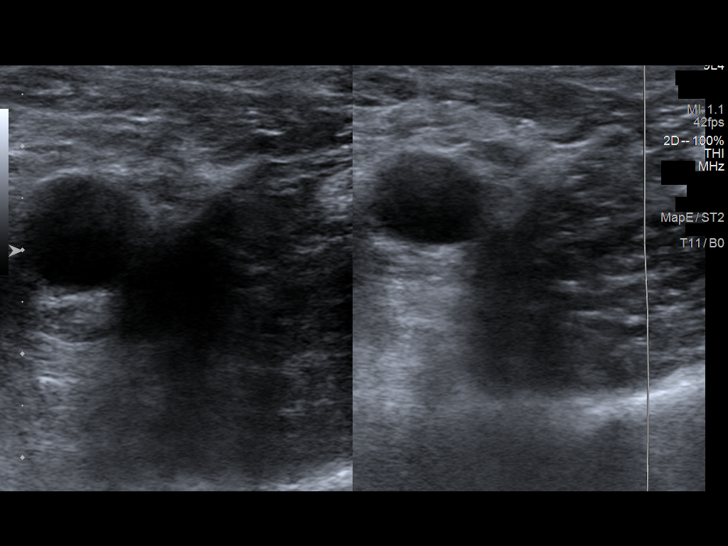
[im 30/37]
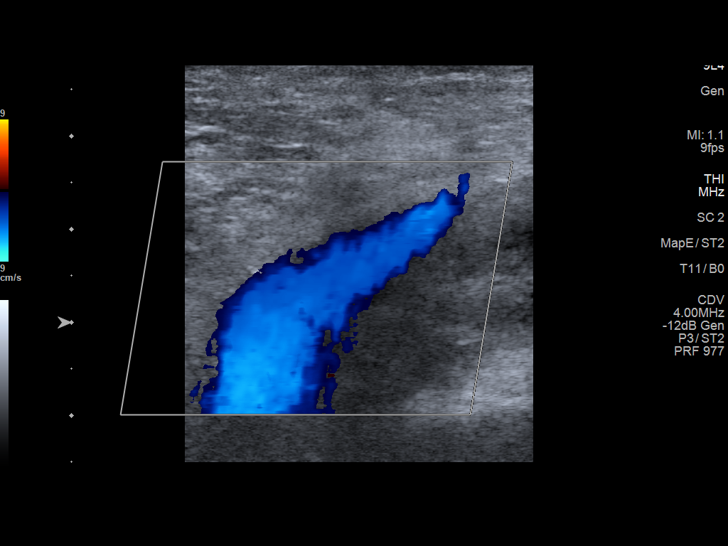
[im 33/37]
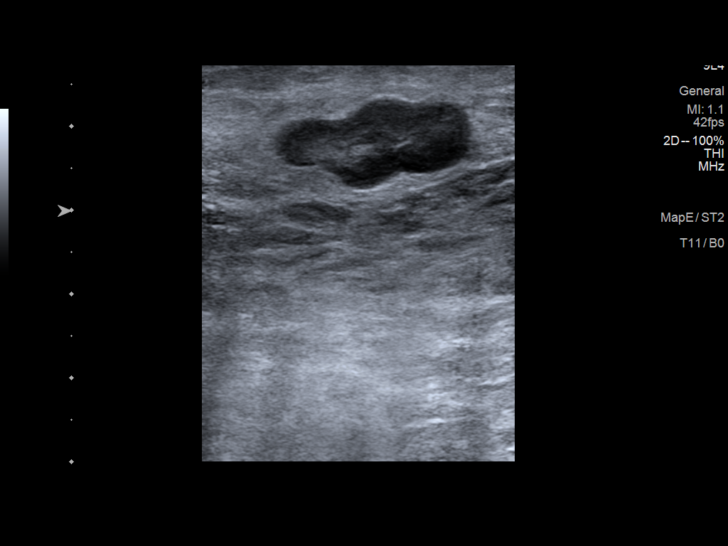
[im 37/37]
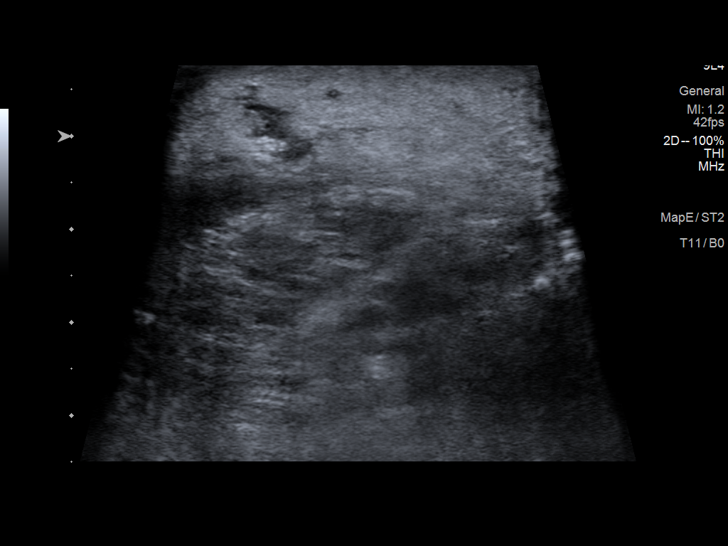

[14 of 24 positions shown; findings below may reference images not displayed]

FINDINGS: VENOUS

Normal compressibility of the common femoral, superficial femoral,
and popliteal veins, as well as the visualized calf veins.
Visualized portions of profunda femoral vein and great saphenous
vein unremarkable. No filling defects to suggest DVT on grayscale or
color Doppler imaging. Doppler waveforms show normal direction of
venous flow, normal respiratory plasticity and response to
augmentation.

Limited views of the contralateral common femoral vein are
unremarkable.

OTHER

None.

Limitations: none
IMPRESSION: Negative.

## 2022-07-20 ENCOUNTER — Ambulatory Visit: Payer: Self-pay | Admitting: *Deleted

## 2022-07-20 NOTE — Telephone Encounter (Signed)
Message from Roslynn Amble sent at 07/20/2022 12:08 PM EST  Summary: Dog bite? Sore and tender   The patient called in stating he was bit by a dog Sunday morning. He was bit on his face below his lip. It was split open at first and is pretty much healed shut but he is wondering if he should be on an antibiotic or anything at this time. He still is sore and tender as it punctured the skin but it was more of a surface wound. Please assist patient further          Call History   Type Contact Phone/Fax User  07/20/2022 12:04 PM EST Phone (Incoming) Jacob Key, Jacob Key (Self) [phone] Jerilynn Mages) Romeo Rabon P

## 2022-07-20 NOTE — Telephone Encounter (Signed)
Pt scheduled 2/7

## 2022-07-20 NOTE — Telephone Encounter (Signed)
Attempted to return his call.  Left a voicemail to call back. 

## 2022-07-20 NOTE — Telephone Encounter (Signed)
Summary: Dog bite? Sore and tender   The patient called in stating he was bit by a dog Sunday morning. He was bit on his face below his lip. It was split open at first and is pretty much healed shut but he is wondering if he should be on an antibiotic or anything at this time. He still is sore and tender as it punctured the skin but it was more of a surface wound. Please assist patient further       Chief Complaint: pet dog bite to face  Symptoms: pet dog bit patient in face split to skin healing and no drainage. Small amount of redness around area of bite under mouth and lip.  Frequency: Sunday  Pertinent Negatives: Patient denies fever, no drainage, no difficulty opening mouth Disposition: [] ED /[] Urgent Care (no appt availability in office) / [] Appointment(In office/virtual)/ []  [facility]/ [] Home Care/ [] Refused Recommended Disposition /[] [facility]/ [x]  Follow-up with PCP Additional Notes:   Attempted to schedule appt none available . Attempted to schedule My Chart VV and unable due to fast slot pass. Please advise if appt can be scheduled today or tomorrow. Patient off work. Advised patient if no call back by 3 pm please go to UC for evaluation and medication. Requesting a call back.      Reason for Disposition  [1] Puncture wound or small cut AND [2] on face  (Exception: Puncture  from small pet such as gerbil, mouse, hamster, puppy.)  Answer Assessment - Initial Assessment Questions 1. ANIMAL: "What type of animal caused the bite?" "Is the injury from a bite or a claw?" If the animal is a dog or a cat, ask: "Was it a pet or a stray?" "Was it acting ill or behaving strangely?"     Dog bite . Pet and not acting ill or strange. Accident  2. LOCATION: "Where is the bite located?"      Below mouth lip 3. SIZE: "How big is the bite?" "What does it look like?"      Area under mouth / lip now closed, small amount redness around bite  4. ONSET: "When did the bite  happen?" (Minutes or hours ago)      Sunday  5. CIRCUMSTANCES: "Tell me how this happened."      Dog in protective mode and bit patient  6. TETANUS: "When was your last tetanus booster?"     Na  review chart  7. RABIES VACCINE: For dog or cat bites, ask: "Do you know if the pet is vaccinated against rabies?"  (e.g., yes, no, overdue for rabies shot, unknown)     Yes  8. PREGNANCY: "Is there any chance you are pregnant?" "When was your last menstrual period?"     na  Protocols used: Animal Bite-A-AH

## 2022-07-21 ENCOUNTER — Ambulatory Visit (INDEPENDENT_AMBULATORY_CARE_PROVIDER_SITE_OTHER): Payer: BC Managed Care – PPO | Admitting: Family Medicine

## 2022-07-21 ENCOUNTER — Encounter: Payer: Self-pay | Admitting: Family Medicine

## 2022-07-21 VITALS — BP 137/83 | HR 60 | Temp 98.2°F | Ht 70.0 in | Wt 181.6 lb

## 2022-07-21 DIAGNOSIS — L03211 Cellulitis of face: Secondary | ICD-10-CM | POA: Diagnosis not present

## 2022-07-21 DIAGNOSIS — W540XXA Bitten by dog, initial encounter: Secondary | ICD-10-CM

## 2022-07-21 MED ORDER — AMOXICILLIN-POT CLAVULANATE 875-125 MG PO TABS: 1.0000 | ORAL_TABLET | Freq: Two times a day (BID) | ORAL | 0 refills | Status: DC

## 2022-07-21 NOTE — Progress Notes (Signed)
BP 137/83   Pulse 60   Temp 98.2 F (36.8 C) (Oral)   Ht 5\' 10"  (1.778 m)   Wt 181 lb 9.6 oz (82.4 kg)   SpO2 97%   BMI 26.06 kg/m    Subjective:    Patient ID: Jacob Key, male    DOB: 12-May-1981, 42 y.o.   MRN: [id number]  HPI: Jacob Key is a 42 y.o. male  Chief Complaint  Patient presents with   Animal Bite    Patient says he helps take care of his roommate who is on hospice and lost his footing while moving around roommate's bed and fell on the roommate's dog and the dog bit him in the lip.   SKIN INFECTION Duration: 3 days Location: face History of trauma in area: yes- dog bite Pain: yes Quality: aching Severity: moderate Redness: no Swelling: no Oozing: no Pus: no Fevers: no Nausea/vomiting: no Status: stable Treatments attempted:warm compresses  Tetanus: UTD   Relevant past medical, surgical, family and social history reviewed and updated as indicated. Interim medical history since our last visit reviewed. Allergies and medications reviewed and updated.  Review of Systems  Constitutional: Negative.   Respiratory: Negative.    Cardiovascular: Negative.   Gastrointestinal: Negative.   Musculoskeletal: Negative.   Psychiatric/Behavioral: Negative.      Per HPI unless specifically indicated above     Objective:    BP 137/83   Pulse 60   Temp 98.2 F (36.8 C) (Oral)   Ht 5\' 10"  (1.778 m)   Wt 181 lb 9.6 oz (82.4 kg)   SpO2 97%   BMI 26.06 kg/m   Wt Readings from Last 3 Encounters:  07/21/22 181 lb 9.6 oz (82.4 kg)  01/26/22 170 lb 12.8 oz (77.5 kg)  10/08/21 178 lb 3.2 oz (80.8 kg)    Physical Exam Vitals and nursing note reviewed.  Constitutional:      General: He is not in acute distress.    Appearance: Normal appearance. He is normal weight. He is not ill-appearing, toxic-appearing or diaphoretic.  HENT:     Head: Normocephalic and atraumatic.     Right Ear: External ear normal.     Left Ear: External ear normal.      Nose: Nose normal.     Mouth/Throat:     Mouth: Mucous membranes are moist.     Pharynx: Oropharynx is clear.  Eyes:     General: No scleral icterus.       Right eye: No discharge.        Left eye: No discharge.     Extraocular Movements: Extraocular movements intact.     Conjunctiva/sclera: Conjunctivae normal.     Pupils: Pupils are equal, round, and reactive to light.  Cardiovascular:     Rate and Rhythm: Normal rate and regular rhythm.     Pulses: Normal pulses.     Heart sounds: Normal heart sounds. No murmur heard.    No friction rub. No gallop.  Pulmonary:     Effort: Pulmonary effort is normal. No respiratory distress.     Breath sounds: Normal breath sounds. No stridor. No wheezing, rhonchi or rales.  Chest:     Chest wall: No tenderness.  Musculoskeletal:        General: Normal range of motion.     Cervical back: Normal range of motion and neck supple.  Skin:    General: Skin is warm and dry.     Capillary Refill: Capillary refill takes less  than 2 seconds.     Coloration: Skin is not jaundiced or pale.     Findings: No bruising, erythema, lesion or rash.  Neurological:     General: No focal deficit present.     Mental Status: He is alert and oriented to person, place, and time. Mental status is at baseline.  Psychiatric:        Mood and Affect: Mood normal.        Behavior: Behavior normal.        Thought Content: Thought content normal.        Judgment: Judgment normal.     Results for orders placed or performed in visit on 01/26/22  Comprehensive metabolic panel  Result Value Ref Range   Glucose 72 70 - 99 mg/dL   BUN 17 6 - 24 mg/dL   Creatinine, Ser 0.96 0.76 - 1.27 mg/dL   eGFR 102 >59 mL/min/1.73   BUN/Creatinine Ratio 18 9 - 20   Sodium 144 134 - 144 mmol/L   Potassium 4.3 3.5 - 5.2 mmol/L   Chloride 104 96 - 106 mmol/L   CO2 25 20 - 29 mmol/L   Calcium 9.7 8.7 - 10.2 mg/dL   Total Protein 7.1 6.0 - 8.5 g/dL   Albumin 4.6 4.1 - 5.1 g/dL    Globulin, Total 2.5 1.5 - 4.5 g/dL   Albumin/Globulin Ratio 1.8 1.2 - 2.2   Bilirubin Total 0.4 0.0 - 1.2 mg/dL   Alkaline Phosphatase 53 44 - 121 IU/L   AST 27 0 - 40 IU/L   ALT 32 0 - 44 IU/L  CBC with Differential/Platelet  Result Value Ref Range   WBC 4.4 3.4 - 10.8 x10E3/uL   RBC 4.63 4.14 - 5.80 x10E6/uL   Hemoglobin 14.1 13.0 - 17.7 g/dL   Hematocrit 42.0 37.5 - 51.0 %   MCV 91 79 - 97 fL   MCH 30.5 26.6 - 33.0 pg   MCHC 33.6 31.5 - 35.7 g/dL   RDW 12.3 11.6 - 15.4 %   Platelets 258 150 - 450 x10E3/uL   Neutrophils 63 Not Estab. %   Lymphs 26 Not Estab. %   Monocytes 7 Not Estab. %   Eos 3 Not Estab. %   Basos 1 Not Estab. %   Neutrophils Absolute 2.8 1.4 - 7.0 x10E3/uL   Lymphocytes Absolute 1.2 0.7 - 3.1 x10E3/uL   Monocytes Absolute 0.3 0.1 - 0.9 x10E3/uL   EOS (ABSOLUTE) 0.1 0.0 - 0.4 x10E3/uL   Basophils Absolute 0.0 0.0 - 0.2 x10E3/uL   Immature Granulocytes 0 Not Estab. %   Immature Grans (Abs) 0.0 0.0 - 0.1 x10E3/uL  Lipid Panel w/o Chol/HDL Ratio  Result Value Ref Range   Cholesterol, Total 205 (H) 100 - 199 mg/dL   Triglycerides 95 0 - 149 mg/dL   HDL 61 >39 mg/dL   VLDL Cholesterol Cal 17 5 - 40 mg/dL   LDL Chol Calc (NIH) 127 (H) 0 - 99 mg/dL  TSH  Result Value Ref Range   TSH 0.948 0.450 - 4.500 uIU/mL  Urinalysis, Routine w reflex microscopic  Result Value Ref Range   Specific Gravity, UA >1.030 (H) 1.005 - 1.030   pH, UA 6.0 5.0 - 7.5   Color, UA Yellow Yellow   Appearance Ur Clear Clear   Leukocytes,UA Negative Negative   Protein,UA Negative Negative/Trace   Glucose, UA Negative Negative   Ketones, UA Negative Negative   RBC, UA Negative Negative   Bilirubin, UA Negative Negative  Urobilinogen, Ur 0.2 0.2 - 1.0 mg/dL   Nitrite, UA Negative Negative      Assessment & Plan:   Problem List Items Addressed This Visit   None Visit Diagnoses     Cellulitis, face    -  Primary   UTD on Tdap. Will treat with augmentin. Call with any  concerns.   Dog bite, initial encounter       UTD on Tdap. Will treat with augmentin. Call with any concerns.        Follow up plan: Return if symptoms worsen or fail to improve.

## 2022-07-29 ENCOUNTER — Ambulatory Visit: Payer: BC Managed Care – PPO | Admitting: Family Medicine

## 2022-07-30 ENCOUNTER — Ambulatory Visit: Payer: BC Managed Care – PPO | Admitting: Physician Assistant

## 2022-07-30 ENCOUNTER — Encounter: Payer: Self-pay | Admitting: Physician Assistant

## 2022-07-30 VITALS — BP 121/77 | HR 67 | Temp 97.9°F | Wt 177.6 lb

## 2022-07-30 DIAGNOSIS — F3342 Major depressive disorder, recurrent, in full remission: Secondary | ICD-10-CM

## 2022-07-30 DIAGNOSIS — E782 Mixed hyperlipidemia: Secondary | ICD-10-CM

## 2022-07-30 MED ORDER — PAROXETINE HCL 40 MG PO TABS: 40.0000 mg | ORAL_TABLET | Freq: Every day | ORAL | 1 refills | Status: DC

## 2022-07-30 MED ORDER — ROSUVASTATIN CALCIUM 20 MG PO TABS: 20.0000 mg | ORAL_TABLET | Freq: Every day | ORAL | 1 refills | Status: DC

## 2022-07-30 NOTE — Progress Notes (Signed)
Established Patient Office Visit  Name: Jacob Key   MRN: [id number]    DOB: Nov 05, 1980   Date:07/30/2022  Today's Provider: Talitha Givens, MHS, PA-C Introduced myself to the patient as a PA-C and provided education on APPs in clinical practice.         Subjective  Chief Complaint  Chief Complaint  Patient presents with   Depression    Depression          DEPRESSION Mood status: controlled reports "I get some anxiety sometimes, sometimes my OCD bothers me a little bit" Satisfied with current treatment?: no Symptom severity: mild  Duration of current treatment : years Side effects: no Medication compliance: excellent compliance Psychotherapy/counseling: no in the past- not interested in restarting this at this time  Previous psychiatric medications: paxil Depressed mood: no Anxious mood: no Anhedonia: no Significant weight loss or gain: no Insomnia: no  reports some trouble going back to sleep if he gets woken up  Fatigue: no Feelings of worthlessness or guilt: no Impaired concentration/indecisiveness: no Suicidal ideations: no Hopelessness: no Crying spells: no    07/30/2022    1:27 PM 01/26/2022    1:05 PM 10/08/2021    8:12 AM 08/28/2021    9:06 AM 07/29/2021   11:12 AM  Depression screen PHQ 2/9  Decreased Interest 0 0 0 0 0  Down, Depressed, Hopeless 1 1 1 $ 0 0  PHQ - 2 Score 1 1 1 $ 0 0  Altered sleeping 0 0 0 1 1  Tired, decreased energy 0 0 0 0 0  Change in appetite 0 0 0 0 0  Feeling bad or failure about yourself  0 0 0 0 0  Trouble concentrating 0 0 0 0 0  Moving slowly or fidgety/restless 0 0 0 0 0  Suicidal thoughts 0 0 0 0 0  PHQ-9 Score 1 1 1 1 1  $ Difficult doing work/chores Not difficult at all Not difficult at all  Not difficult at all       07/30/2022    1:27 PM 01/26/2022    1:05 PM 10/08/2021    8:12 AM 08/28/2021    9:07 AM  GAD 7 : Generalized Anxiety Score  Nervous, Anxious, on Edge 1 0 1 1  Control/stop worrying 0 0 0 1   Worry too much - different things 1 0 0 0  Trouble relaxing 0 0 0 0  Restless 0 0 0 0  Easily annoyed or irritable 0 0 0 0  Afraid - awful might happen 0 0 0 0  Total GAD 7 Score 2 0 1 2  Anxiety Difficulty Not difficult at all   Not difficult at all     HYPERLIPIDEMIA Hyperlipidemia status: excellent compliance Satisfied with current treatment?  yes Side effects:  no Medication compliance: excellent compliance Past cholesterol meds: rosuvastatin (crestor) Supplements: none Aspirin:  no The 10-year ASCVD risk score (Arnett DK, et al., 2019) is: 2.9%   Values used to calculate the score:     Age: 42 years     Sex: Male     Is Non-Hispanic African American: No     Diabetic: No     Tobacco smoker: Yes     Systolic Blood Pressure: 123XX123 mmHg     Is BP treated: No     HDL Cholesterol: 61 mg/dL     Total Cholesterol: 205 mg/dL Chest pain:  no Coronary artery disease:  no  Patient Active Problem List   Diagnosis Date Noted   Insect bite 02/25/2021   Chronic daily headache 10/10/2020   Cigarette smoker 08/26/2019   Hyperlipidemia 03/24/2015   Depression 01/01/2014   Alcohol consumption four to six days per week 01/01/2014    Past Surgical History:  Procedure Laterality Date   FACIAL RECONSTRUCTION SURGERY     Lower face   TONSILLECTOMY      Family History  Problem Relation Age of Onset   Hyperlipidemia Mother    Hyperlipidemia Father    Alcohol abuse Maternal Grandmother    Heart disease Maternal Grandmother    Heart disease Maternal Grandfather    Alzheimer's disease Paternal Grandfather     Social History   Tobacco Use   Smoking status: Former    Packs/day: 0.50    Years: 0.50    Total pack years: 0.25    Types: Cigarettes, E-cigarettes    Start date: 01/18/2015   Smokeless tobacco: Never  Substance Use Topics   Alcohol use: Yes    Alcohol/week: 20.0 standard drinks of alcohol    Types: 20 Cans of beer per week    Comment: moderate      Current Outpatient Medications:    amoxicillin-clavulanate (AUGMENTIN) 875-125 MG tablet, Take 1 tablet by mouth 2 (two) times daily., Disp: 20 tablet, Rfl: 0   ibuprofen (ADVIL) 800 MG tablet, Take 1 tablet (800 mg total) by mouth every 8 (eight) hours as needed., Disp: 180 tablet, Rfl: 1   ondansetron (ZOFRAN-ODT) 4 MG disintegrating tablet, DISSOLVE 1 TABLET IN MOUTH EVERY 8 HOURS AS NEEDED FOR NAUSEA AND VOMITING, Disp: 20 tablet, Rfl: 6   PARoxetine (PAXIL) 40 MG tablet, Take 1 tablet (40 mg total) by mouth daily., Disp: 90 tablet, Rfl: 1   rosuvastatin (CRESTOR) 20 MG tablet, Take 1 tablet (20 mg total) by mouth daily., Disp: 90 tablet, Rfl: 1  No Known Allergies  I personally reviewed active problem list, medication list, allergies, health maintenance, notes from last encounter, lab results with the patient/caregiver today.   Review of Systems  Respiratory:  Negative for shortness of breath and wheezing.   Cardiovascular:  Negative for chest pain, palpitations and leg swelling.  Psychiatric/Behavioral:  Positive for depression.       Objective  Vitals:   07/30/22 1324 07/30/22 1326  BP: (!) 145/75 121/77  Pulse: 66 67  Temp: 97.9 F (36.6 C)   TempSrc: Oral   SpO2: 98%   Weight: 177 lb 9.6 oz (80.6 kg)     Body mass index is 25.48 kg/m.  Physical Exam Vitals reviewed.  Constitutional:      General: He is awake.     Appearance: Normal appearance. He is well-developed and well-groomed.  HENT:     Head: Normocephalic and atraumatic.  Eyes:     Extraocular Movements: Extraocular movements intact.     Conjunctiva/sclera: Conjunctivae normal.     Pupils: Pupils are equal, round, and reactive to light.  Cardiovascular:     Rate and Rhythm: Normal rate and regular rhythm.     Heart sounds: Normal heart sounds. No murmur heard.    No friction rub. No gallop.  Pulmonary:     Effort: Pulmonary effort is normal.     Breath sounds: Normal breath sounds. No  decreased air movement. No decreased breath sounds, wheezing, rhonchi or rales.  Musculoskeletal:        General: Normal range of motion.     Cervical back: Normal range of  motion and neck supple.     Right lower leg: No edema.     Left lower leg: No edema.  Skin:    General: Skin is warm.  Neurological:     General: No focal deficit present.     Mental Status: He is alert and oriented to person, place, and time. Mental status is at baseline.  Psychiatric:        Mood and Affect: Mood normal.        Behavior: Behavior normal. Behavior is cooperative.        Thought Content: Thought content normal.        Judgment: Judgment normal.      No results found for this or any previous visit (from the past 2160 hour(s)).   PHQ2/9:    07/30/2022    1:27 PM 01/26/2022    1:05 PM 10/08/2021    8:12 AM 08/28/2021    9:06 AM 07/29/2021   11:12 AM  Depression screen PHQ 2/9  Decreased Interest 0 0 0 0 0  Down, Depressed, Hopeless 1 1 1 $ 0 0  PHQ - 2 Score 1 1 1 $ 0 0  Altered sleeping 0 0 0 1 1  Tired, decreased energy 0 0 0 0 0  Change in appetite 0 0 0 0 0  Feeling bad or failure about yourself  0 0 0 0 0  Trouble concentrating 0 0 0 0 0  Moving slowly or fidgety/restless 0 0 0 0 0  Suicidal thoughts 0 0 0 0 0  PHQ-9 Score 1 1 1 1 1  $ Difficult doing work/chores Not difficult at all Not difficult at all  Not difficult at all       Fall Risk:    07/30/2022    1:28 PM 01/26/2022    1:03 PM 08/28/2021    9:06 AM 07/29/2021   11:12 AM 03/02/2021    9:38 AM  Fall Risk   Falls in the past year? 0 0 0 0 0  Number falls in past yr: 0 0 0 0 0  Injury with Fall? 0 0 0 0 0  Risk for fall due to : No Fall Risks No Fall Risks  No Fall Risks No Fall Risks  Follow up Falls evaluation completed Falls evaluation completed  Falls evaluation completed Falls evaluation completed      Functional Status Survey:      Assessment & Plan  Problem List Items Addressed This Visit       Other    Depression    Chronic, appears well controlled and he is tolerating medication well Currently taking Paxil 40 mg PO QD Refills provided today  Reviewed PHQ9 and GAD7 scores with him Continue current medications  Follow up in 6 months for regular monitoring or sooner if concerns arise        Relevant Medications   PARoxetine (PAXIL) 40 MG tablet   Hyperlipidemia - Primary    Chronic, historic concern Currently taking rosuvastatin 20 mg PO QD and appears to be tolerating well Repeat lipid panel, CMP, CBC today for regular monitoring- results to dictate further management Follow up in 6 months for monitoring or sooner if concerns arise      Relevant Medications   rosuvastatin (CRESTOR) 20 MG tablet   Other Relevant Orders   Comp Met (CMET)   Lipid Profile   CBC w/Diff     Return in about 6 months (around 01/28/2023) for HLD, Depression follow up/ CPE.   Charlesetta Ivory  E Chakia Counts, PA-C, have reviewed all documentation for this visit. The documentation on 07/30/22 for the exam, diagnosis, procedures, and orders are all accurate and complete.   Talitha Givens, MHS, PA-C [facility]

## 2022-07-30 NOTE — Assessment & Plan Note (Signed)
Chronic, appears well controlled and he is tolerating medication well Currently taking Paxil 40 mg PO QD Refills provided today  Reviewed PHQ9 and GAD7 scores with him Continue current medications  Follow up in 6 months for regular monitoring or sooner if concerns arise

## 2022-07-30 NOTE — Assessment & Plan Note (Signed)
Chronic, historic concern Currently taking rosuvastatin 20 mg PO QD and appears to be tolerating well Repeat lipid panel, CMP, CBC today for regular monitoring- results to dictate further management Follow up in 6 months for monitoring or sooner if concerns arise

## 2022-07-30 NOTE — Progress Notes (Deleted)
          Acute Office Visit   Patient: Jacob Key   DOB: July 24, 1980   42 y.o. Male  MRN: [id number] Visit Date: 07/30/2022  Today's healthcare provider: Dani Gobble Delena Casebeer, PA-C  Introduced myself to the patient as a Journalist, newspaper and provided education on APPs in clinical practice.    No chief complaint on file.  Subjective    HPI    Medications: Outpatient Medications Prior to Visit  Medication Sig   amoxicillin-clavulanate (AUGMENTIN) 875-125 MG tablet Take 1 tablet by mouth 2 (two) times daily.   ibuprofen (ADVIL) 800 MG tablet Take 1 tablet (800 mg total) by mouth every 8 (eight) hours as needed.   ondansetron (ZOFRAN-ODT) 4 MG disintegrating tablet DISSOLVE 1 TABLET IN MOUTH EVERY 8 HOURS AS NEEDED FOR NAUSEA AND VOMITING   PARoxetine (PAXIL) 40 MG tablet Take 1 tablet (40 mg total) by mouth daily.   rosuvastatin (CRESTOR) 20 MG tablet Take 1 tablet (20 mg total) by mouth daily.   No facility-administered medications prior to visit.    Review of Systems  {Labs  Heme  Chem  Endocrine  Serology  Results Review (optional):23779}   Objective    There were no vitals taken for this visit. {Show previous vital signs (optional):23777}  Physical Exam    No results found for any visits on 07/30/22.  Assessment & Plan      No follow-ups on file.

## 2022-07-31 LAB — CBC WITH DIFFERENTIAL/PLATELET
Basophils Absolute: 0 10*3/uL (ref 0.0–0.2)
Basos: 1 %
EOS (ABSOLUTE): 0.1 10*3/uL (ref 0.0–0.4)
Eos: 3 %
Hematocrit: 43.1 % (ref 37.5–51.0)
Hemoglobin: 14.9 g/dL (ref 13.0–17.7)
Immature Grans (Abs): 0 10*3/uL (ref 0.0–0.1)
Immature Granulocytes: 0 %
Lymphocytes Absolute: 1.3 10*3/uL (ref 0.7–3.1)
Lymphs: 36 %
MCH: 30.7 pg (ref 26.6–33.0)
MCHC: 34.6 g/dL (ref 31.5–35.7)
MCV: 89 fL (ref 79–97)
Monocytes Absolute: 0.2 10*3/uL (ref 0.1–0.9)
Monocytes: 7 %
Neutrophils Absolute: 2 10*3/uL (ref 1.4–7.0)
Neutrophils: 53 %
Platelets: 266 10*3/uL (ref 150–450)
RBC: 4.86 x10E6/uL (ref 4.14–5.80)
RDW: 12.3 % (ref 11.6–15.4)
WBC: 3.6 10*3/uL (ref 3.4–10.8)

## 2022-07-31 LAB — COMPREHENSIVE METABOLIC PANEL
ALT: 34 IU/L (ref 0–44)
AST: 32 IU/L (ref 0–40)
Albumin/Globulin Ratio: 2.1 (ref 1.2–2.2)
Albumin: 4.9 g/dL (ref 4.1–5.1)
Alkaline Phosphatase: 61 IU/L (ref 44–121)
BUN/Creatinine Ratio: 27 — ABNORMAL HIGH (ref 9–20)
BUN: 25 mg/dL — ABNORMAL HIGH (ref 6–24)
Bilirubin Total: 0.4 mg/dL (ref 0.0–1.2)
CO2: 26 mmol/L (ref 20–29)
Calcium: 9.9 mg/dL (ref 8.7–10.2)
Chloride: 101 mmol/L (ref 96–106)
Creatinine, Ser: 0.93 mg/dL (ref 0.76–1.27)
Globulin, Total: 2.3 g/dL (ref 1.5–4.5)
Glucose: 88 mg/dL (ref 70–99)
Potassium: 4.5 mmol/L (ref 3.5–5.2)
Sodium: 141 mmol/L (ref 134–144)
Total Protein: 7.2 g/dL (ref 6.0–8.5)
eGFR: 106 mL/min/{1.73_m2} (ref >59)

## 2022-07-31 LAB — LIPID PANEL
Chol/HDL Ratio: 3.3 ratio (ref 0.0–5.0)
Cholesterol, Total: 202 mg/dL — ABNORMAL HIGH (ref 100–199)
HDL: 62 mg/dL (ref >39)
LDL Chol Calc (NIH): 125 mg/dL — ABNORMAL HIGH (ref 0–99)
Triglycerides: 85 mg/dL (ref 0–149)
VLDL Cholesterol Cal: 15 mg/dL (ref 5–40)

## 2022-08-02 NOTE — Progress Notes (Signed)
Labs are normal/stable with the exception of your cholesterol.  It is still a bit elevated but your overall cardiovascular disease risk is pretty low and you are already on a statin medication  The ASCVD risk is 2.7% so I recommend continuing with Crestor and staying active, reducing dietary intake of saturated fats for further control Let us know if you have further questions or concerns.

## 2022-10-06 ENCOUNTER — Ambulatory Visit (INDEPENDENT_AMBULATORY_CARE_PROVIDER_SITE_OTHER): Payer: BC Managed Care – PPO | Admitting: Family Medicine

## 2022-10-06 ENCOUNTER — Encounter: Payer: Self-pay | Admitting: Family Medicine

## 2022-10-06 VITALS — BP 131/68 | HR 57 | Temp 98.0°F | Ht 70.0 in | Wt 179.2 lb

## 2022-10-06 DIAGNOSIS — M791 Myalgia, unspecified site: Secondary | ICD-10-CM | POA: Diagnosis not present

## 2022-10-06 MED ORDER — MELOXICAM 15 MG PO TABS: 15.0000 mg | ORAL_TABLET | Freq: Every day | ORAL | 3 refills | Status: DC

## 2022-10-06 NOTE — Progress Notes (Signed)
BP 131/68   Pulse (!) 57   Temp 98 F (36.7 C) (Oral)   Ht 5\' 10"  (1.778 m)   Wt 179 lb 3.2 oz (81.3 kg)   SpO2 97%   BMI 25.71 kg/m    Subjective:    Patient ID: Jacob Key, male    DOB: Jun 22, 1980, 42 y.o.   MRN: [id number]  HPI: Jacob Key is a 42 y.o. male  Chief Complaint  Patient presents with   Muscle Pain    Patient says he has noticed more muscular pain about three weeks ago. Patient says it is pain and discomfort all over his body. Patient says it comes and goes, some days he feels as if he is better and then it comes back.    Has been having muscle pains in his arms and sides and legs for about 3 weeks. He's not sure what he did to start them. He does do a lot of lifting at work, but nothing has changed. He notes that the pain comes and goes and moves around. Ibuprofen makes it better. Nothing really makes it worse. No radiation. No fevers. No chills. He has been on his cholesterol medicine a long time and has never had a problem before. No other concerns or complaints at this time.   Relevant past medical, surgical, family and social history reviewed and updated as indicated. Interim medical history since our last visit reviewed. Allergies and medications reviewed and updated.  Review of Systems  Constitutional: Negative.   Respiratory: Negative.    Cardiovascular: Negative.   Gastrointestinal: Negative.   Musculoskeletal:  Positive for back pain and myalgias. Negative for arthralgias, gait problem, joint swelling, neck pain and neck stiffness.  Skin: Negative.   Neurological: Negative.   Psychiatric/Behavioral: Negative.      Per HPI unless specifically indicated above     Objective:    BP 131/68   Pulse (!) 57   Temp 98 F (36.7 C) (Oral)   Ht 5\' 10"  (1.778 m)   Wt 179 lb 3.2 oz (81.3 kg)   SpO2 97%   BMI 25.71 kg/m   Wt Readings from Last 3 Encounters:  10/06/22 179 lb 3.2 oz (81.3 kg)  07/30/22 177 lb 9.6 oz (80.6 kg)  07/21/22 181 lb  9.6 oz (82.4 kg)    Physical Exam Vitals and nursing note reviewed.  Constitutional:      General: He is not in acute distress.    Appearance: Normal appearance. He is normal weight. He is not ill-appearing, toxic-appearing or diaphoretic.  HENT:     Head: Normocephalic and atraumatic.     Right Ear: External ear normal.     Left Ear: External ear normal.     Nose: Nose normal.     Mouth/Throat:     Mouth: Mucous membranes are moist.     Pharynx: Oropharynx is clear.  Eyes:     General: No scleral icterus.       Right eye: No discharge.        Left eye: No discharge.     Extraocular Movements: Extraocular movements intact.     Conjunctiva/sclera: Conjunctivae normal.     Pupils: Pupils are equal, round, and reactive to light.  Cardiovascular:     Rate and Rhythm: Normal rate and regular rhythm.     Pulses: Normal pulses.     Heart sounds: Normal heart sounds. No murmur heard.    No friction rub. No gallop.  Pulmonary:  Effort: Pulmonary effort is normal. No respiratory distress.     Breath sounds: Normal breath sounds. No stridor. No wheezing, rhonchi or rales.  Chest:     Chest wall: No tenderness.  Musculoskeletal:        General: Normal range of motion.     Cervical back: Normal range of motion and neck supple.  Skin:    General: Skin is warm and dry.     Capillary Refill: Capillary refill takes less than 2 seconds.     Coloration: Skin is not jaundiced or pale.     Findings: No bruising, erythema, lesion or rash.  Neurological:     General: No focal deficit present.     Mental Status: He is alert and oriented to person, place, and time. Mental status is at baseline.  Psychiatric:        Mood and Affect: Mood normal.        Behavior: Behavior normal.        Thought Content: Thought content normal.        Judgment: Judgment normal.     Results for orders placed or performed in visit on 07/30/22  Comp Met (CMET)  Result Value Ref Range   Glucose 88 70 - 99  mg/dL   BUN 25 (H) 6 - 24 mg/dL   Creatinine, Ser 1.61 0.76 - 1.27 mg/dL   eGFR 096 >04 VW/UJW/1.19   BUN/Creatinine Ratio 27 (H) 9 - 20   Sodium 141 134 - 144 mmol/L   Potassium 4.5 3.5 - 5.2 mmol/L   Chloride 101 96 - 106 mmol/L   CO2 26 20 - 29 mmol/L   Calcium 9.9 8.7 - 10.2 mg/dL   Total Protein 7.2 6.0 - 8.5 g/dL   Albumin 4.9 4.1 - 5.1 g/dL   Globulin, Total 2.3 1.5 - 4.5 g/dL   Albumin/Globulin Ratio 2.1 1.2 - 2.2   Bilirubin Total 0.4 0.0 - 1.2 mg/dL   Alkaline Phosphatase 61 44 - 121 IU/L   AST 32 0 - 40 IU/L   ALT 34 0 - 44 IU/L  Lipid Profile  Result Value Ref Range   Cholesterol, Total 202 (H) 100 - 199 mg/dL   Triglycerides 85 0 - 149 mg/dL   HDL 62 >14 mg/dL   VLDL Cholesterol Cal 15 5 - 40 mg/dL   LDL Chol Calc (NIH) 782 (H) 0 - 99 mg/dL   Chol/HDL Ratio 3.3 0.0 - 5.0 ratio  CBC w/Diff  Result Value Ref Range   WBC 3.6 3.4 - 10.8 x10E3/uL   RBC 4.86 4.14 - 5.80 x10E6/uL   Hemoglobin 14.9 13.0 - 17.7 g/dL   Hematocrit 95.6 21.3 - 51.0 %   MCV 89 79 - 97 fL   MCH 30.7 26.6 - 33.0 pg   MCHC 34.6 31.5 - 35.7 g/dL   RDW 08.6 57.8 - 46.9 %   Platelets 266 150 - 450 x10E3/uL   Neutrophils 53 Not Estab. %   Lymphs 36 Not Estab. %   Monocytes 7 Not Estab. %   Eos 3 Not Estab. %   Basos 1 Not Estab. %   Neutrophils Absolute 2.0 1.4 - 7.0 x10E3/uL   Lymphocytes Absolute 1.3 0.7 - 3.1 x10E3/uL   Monocytes Absolute 0.2 0.1 - 0.9 x10E3/uL   EOS (ABSOLUTE) 0.1 0.0 - 0.4 x10E3/uL   Basophils Absolute 0.0 0.0 - 0.2 x10E3/uL   Immature Granulocytes 0 Not Estab. %   Immature Grans (Abs) 0.0 0.0 - 0.1 x10E3/uL  Assessment & Plan:   Problem List Items Addressed This Visit   None Visit Diagnoses     Myalgia    -  Primary   Will hold crestor for 3 days and see if it makes a differnence. Will check labs and start meloxicam. Call with any concerns or if not improving.   Relevant Orders   CBC with Differential/Platelet   Comprehensive metabolic panel   CK  (Creatine Kinase)   Lyme Disease Serology w/Reflex   Spotted Fever Group Antibodies   Ehrlichia Antibody Panel        Follow up plan: Return if symptoms worsen or fail to improve.

## 2022-10-12 LAB — COMPREHENSIVE METABOLIC PANEL
ALT: 28 IU/L (ref 0–44)
AST: 27 IU/L (ref 0–40)
Albumin/Globulin Ratio: 1.7 (ref 1.2–2.2)
Albumin: 4.3 g/dL (ref 4.1–5.1)
Alkaline Phosphatase: 76 IU/L (ref 44–121)
BUN/Creatinine Ratio: 23 — ABNORMAL HIGH (ref 9–20)
BUN: 19 mg/dL (ref 6–24)
Bilirubin Total: 0.3 mg/dL (ref 0.0–1.2)
CO2: 26 mmol/L (ref 20–29)
Calcium: 9.5 mg/dL (ref 8.7–10.2)
Chloride: 101 mmol/L (ref 96–106)
Creatinine, Ser: 0.84 mg/dL (ref 0.76–1.27)
Globulin, Total: 2.5 g/dL (ref 1.5–4.5)
Glucose: 118 mg/dL — ABNORMAL HIGH (ref 70–99)
Potassium: 4 mmol/L (ref 3.5–5.2)
Sodium: 140 mmol/L (ref 134–144)
Total Protein: 6.8 g/dL (ref 6.0–8.5)
eGFR: 112 mL/min/{1.73_m2} (ref >59)

## 2022-10-12 LAB — CBC WITH DIFFERENTIAL/PLATELET
Basophils Absolute: 0 10*3/uL (ref 0.0–0.2)
Basos: 0 %
EOS (ABSOLUTE): 0.2 10*3/uL (ref 0.0–0.4)
Eos: 3 %
Hematocrit: 43 % (ref 37.5–51.0)
Hemoglobin: 14.1 g/dL (ref 13.0–17.7)
Immature Grans (Abs): 0 10*3/uL (ref 0.0–0.1)
Immature Granulocytes: 0 %
Lymphocytes Absolute: 1.2 10*3/uL (ref 0.7–3.1)
Lymphs: 19 %
MCH: 30.4 pg (ref 26.6–33.0)
MCHC: 32.8 g/dL (ref 31.5–35.7)
MCV: 93 fL (ref 79–97)
Monocytes Absolute: 0.5 10*3/uL (ref 0.1–0.9)
Monocytes: 8 %
Neutrophils Absolute: 4.3 10*3/uL (ref 1.4–7.0)
Neutrophils: 70 %
Platelets: 235 10*3/uL (ref 150–450)
RBC: 4.64 x10E6/uL (ref 4.14–5.80)
RDW: 12.6 % (ref 11.6–15.4)
WBC: 6.2 10*3/uL (ref 3.4–10.8)

## 2022-10-12 LAB — EHRLICHIA ANTIBODY PANEL
E. Chaffeensis (HME) IgM Titer: NEGATIVE
E.Chaffeensis (HME) IgG: NEGATIVE
HGE IgG Titer: NEGATIVE
HGE IgM Titer: NEGATIVE

## 2022-10-12 LAB — SPOTTED FEVER GROUP ANTIBODIES
Spotted Fever Group IgG: <1:64 {titer}
Spotted Fever Group IgM: <1:64 {titer}

## 2022-10-12 LAB — LYME DISEASE SEROLOGY W/REFLEX: Lyme Total Antibody EIA: NEGATIVE

## 2022-10-12 LAB — CK: Total CK: 141 U/L (ref 49–439)

## 2022-12-27 ENCOUNTER — Telehealth: Payer: 59 | Admitting: Nurse Practitioner

## 2022-12-27 ENCOUNTER — Ambulatory Visit: Payer: Self-pay

## 2022-12-27 DIAGNOSIS — U071 COVID-19: Secondary | ICD-10-CM

## 2022-12-27 MED ORDER — NIRMATRELVIR/RITONAVIR (PAXLOVID)TABLET: 3.0000 | ORAL_TABLET | Freq: Two times a day (BID) | ORAL | 0 refills | Status: AC

## 2022-12-27 MED ORDER — IPRATROPIUM BROMIDE 0.03 % NA SOLN: 2.0000 | Freq: Two times a day (BID) | NASAL | 12 refills | Status: DC

## 2022-12-27 NOTE — Telephone Encounter (Signed)
Pt took a covid test, and wants to be seen in office.   Best contact: [phone]    Chief Complaint: COVID positive today Symptoms: Sore throat, body aches, fever last night Frequency: Yesterday Pertinent Negatives: Patient denies SOB Disposition: [] ED /[] Urgent Care (no appt availability in office) / [] Appointment(In office/virtual)/ [x]  [place] [facility]/ [] Home Care/ [] Refused Recommended Disposition /[] [facility]/ []  Follow-up with PCP Additional Notes:   Reason for Disposition  MILD difficulty breathing (e.g., minimal/no SOB at rest, SOB with walking, pulse <100)  Answer Assessment - Initial Assessment Questions 1. COVID-19 DIAGNOSIS: "How do you know that you have COVID?" (e.g., positive lab test or self-test, diagnosed by doctor or NP/PA, symptoms after exposure).     Home test 2. COVID-19 EXPOSURE: "Was there any known exposure to COVID before the symptoms began?" CDC Definition of close contact: within 6 feet (2 meters) for a total of 15 minutes or more over a 24-hour period.      No 3. ONSET: "When did the COVID-19 symptoms start?"      Yesterday 4. WORST SYMPTOM: "What is your worst symptom?" (e.g., cough, fever, shortness of breath, muscle aches)     Sore throat, body aches 5. COUGH: "Do you have a cough?" If Yes, ask: "How bad is the cough?"       No 6. FEVER: "Do you have a fever?" If Yes, ask: "What is your temperature, how was it measured, and when did it start?"     Last night - 101 7. RESPIRATORY STATUS: "Describe your breathing?" (e.g., normal; shortness of breath, wheezing, unable to speak)      No 8. BETTER-SAME-WORSE: "Are you getting better, staying the same or getting worse compared to yesterday?"  If getting worse, ask, "In what way?"     Better 9. OTHER SYMPTOMS: "Do you have any other symptoms?"  (e.g., chills, fatigue, headache, loss of smell or taste, muscle pain, sore throat)     No 10. HIGH RISK DISEASE: "Do you have any chronic  medical problems?" (e.g., asthma, heart or lung disease, weak immune system, obesity, etc.)       No 11. VACCINE: "Have you had the COVID-19 vaccine?" If Yes, ask: "Which one, how many shots, when did you get it?"       N/a 12. PREGNANCY: "Is there any chance you are pregnant?" "When was your last menstrual period?"       N/a 13. O2 SATURATION MONITOR:  "Do you use an oxygen saturation monitor (pulse oximeter) at home?" If Yes, ask "What is your reading (oxygen level) today?" "What is your usual oxygen saturation reading?" (e.g., 95%)       No  Protocols used: Coronavirus (COVID-19) Diagnosed or Suspected-A-AH

## 2022-12-27 NOTE — Progress Notes (Signed)
Virtual Visit Consent   Jacob Key, you are scheduled for a virtual visit with a [facility] provider today. Just as with appointments in the office, your consent must be obtained to participate. Your consent will be active for this visit and any virtual visit you may have with one of our providers in the next 365 days. If you have a MyChart account, a copy of this consent can be sent to you electronically.  As this is a virtual visit, video technology does not allow for your provider to perform a traditional examination. This may limit your provider's ability to fully assess your condition. If your provider identifies any concerns that need to be evaluated in person or the need to arrange testing (such as labs, EKG, etc.), we will make arrangements to do so. Although advances in technology are sophisticated, we cannot ensure that it will always work on either your end or our end. If the connection with a video visit is poor, the visit may have to be switched to a telephone visit. With either a video or telephone visit, we are not always able to ensure that we have a secure connection.  By engaging in this virtual visit, you consent to the provision of healthcare and authorize for your insurance to be billed (if applicable) for the services provided during this visit. Depending on your insurance coverage, you may receive a charge related to this service.  I need to obtain your verbal consent now. Are you willing to proceed with your visit today? Jacob Key has provided verbal consent on 12/27/2022 for a virtual visit (video or telephone). Viviano Simas, FNP  Date: 12/27/2022 12:18 PM  Virtual Visit via Video Note   I, Viviano Simas, connected with  Jacob Key  ([id number], [birth year]) on 12/27/22 at 12:15 PM EDT by a video-enabled telemedicine application and verified that I am speaking with the correct person using two identifiers.  Location: Patient: Virtual Visit Location Patient:  Home Provider: Virtual Visit Location Provider: Home Office   I discussed the limitations of evaluation and management by telemedicine and the availability of in person appointments. The patient expressed understanding and agreed to proceed.    History of Present Illness: Jacob Key is a 42 y.o. who identifies as a male who was assigned male at birth, and is being seen today for COVID   He took a home test today and was positive  When he woke up yesterday he had a sore throat and developed body aches  Symptoms today include: Body aches, sore throat, no cough   Symptoms are improving with tylenol    He has had COVID once in the past  Denies a history of complications from COVID  He has not had vaccines for COVID   Denies a history of asthma  Denies HTN or DM   Problems:  Patient Active Problem List   Diagnosis Date Noted   Insect bite 02/25/2021   Chronic daily headache 10/10/2020   Cigarette smoker 08/26/2019   Hyperlipidemia 03/24/2015   Depression 01/01/2014   Alcohol consumption four to six days per week 01/01/2014    Allergies: No Known Allergies Medications:  Current Outpatient Medications:    ibuprofen (ADVIL) 800 MG tablet, Take 1 tablet (800 mg total) by mouth every 8 (eight) hours as needed., Disp: 180 tablet, Rfl: 1   meloxicam (MOBIC) 15 MG tablet, Take 1 tablet (15 mg total) by mouth daily., Disp: 30 tablet, Rfl: 3   ondansetron (ZOFRAN-ODT) 4 MG disintegrating tablet,  DISSOLVE 1 TABLET IN MOUTH EVERY 8 HOURS AS NEEDED FOR NAUSEA AND VOMITING, Disp: 20 tablet, Rfl: 6   PARoxetine (PAXIL) 40 MG tablet, Take 1 tablet (40 mg total) by mouth daily., Disp: 90 tablet, Rfl: 1   rosuvastatin (CRESTOR) 20 MG tablet, Take 1 tablet (20 mg total) by mouth daily., Disp: 90 tablet, Rfl: 1  Observations/Objective: Patient is well-developed, well-nourished in no acute distress.  Resting comfortably  at home.  Head is normocephalic, atraumatic.  No labored breathing.   Speech is clear and coherent with logical content.  Patient is alert and oriented at baseline.    Assessment and Plan: 1. COVID-19 Hold Rosuvastatin while on anti-viral  - nirmatrelvir/ritonavir (PAXLOVID) 20 x 150 MG & 10 x 100MG  TABS; Take 3 tablets by mouth 2 (two) times daily for 5 days. (Take nirmatrelvir 150 mg two tablets twice daily for 5 days and ritonavir 100 mg one tablet twice daily for 5 days) Patient GFR is 112  Dispense: 30 tablet; Refill: 0 - ipratropium (ATROVENT) 0.03 % nasal spray; Place 2 sprays into both nostrils every 12 (twelve) hours.  Dispense: 30 mL; Refill: 12     Follow Up Instructions: I discussed the assessment and treatment plan with the patient. The patient was provided an opportunity to ask questions and all were answered. The patient agreed with the plan and demonstrated an understanding of the instructions.  A copy of instructions were sent to the patient via MyChart unless otherwise noted below.    The patient was advised to call back or seek an in-person evaluation if the symptoms worsen or if the condition fails to improve as anticipated.  Time:  I spent 15 minutes with the patient via telehealth technology discussing the above problems/concerns.    Viviano Simas, FNP

## 2022-12-28 ENCOUNTER — Other Ambulatory Visit: Payer: Self-pay | Admitting: Family Medicine

## 2022-12-28 DIAGNOSIS — F3342 Major depressive disorder, recurrent, in full remission: Secondary | ICD-10-CM

## 2022-12-28 NOTE — Telephone Encounter (Signed)
Medication Refill - Medication: PARoxetine (PAXIL) 40 MG tablet   Has the patient contacted their pharmacy? No. No,  more refills.  (Agent: If no, request that the patient contact the pharmacy for the refill. If patient does not wish to contact the pharmacy document the reason why and proceed with request.)   Preferred Pharmacy (with phone number or street name):  [facility] [address] [place] - [address]  [address] [place] [address]  Phone: [phone] Fax: [phone]  Hours: Not open 24 hours   Has the patient been seen for an appointment in the last year OR does the patient have an upcoming appointment? Yes.    Agent: Please be advised that RX refills may take up to 3 business days. We ask that you follow-up with your pharmacy.

## 2023-01-28 ENCOUNTER — Encounter: Payer: Self-pay | Admitting: Family Medicine

## 2023-01-28 ENCOUNTER — Ambulatory Visit (INDEPENDENT_AMBULATORY_CARE_PROVIDER_SITE_OTHER): Payer: No Typology Code available for payment source | Admitting: Family Medicine

## 2023-01-28 VITALS — BP 134/74 | HR 73 | Temp 97.8°F | Ht 69.0 in | Wt 180.6 lb

## 2023-01-28 DIAGNOSIS — F3342 Major depressive disorder, recurrent, in full remission: Secondary | ICD-10-CM

## 2023-01-28 DIAGNOSIS — Z0000 Encounter for general adult medical examination without abnormal findings: Secondary | ICD-10-CM

## 2023-01-28 DIAGNOSIS — E782 Mixed hyperlipidemia: Secondary | ICD-10-CM | POA: Diagnosis not present

## 2023-01-28 DIAGNOSIS — R1084 Generalized abdominal pain: Secondary | ICD-10-CM

## 2023-01-28 MED ORDER — ROSUVASTATIN CALCIUM 20 MG PO TABS: 20.0000 mg | ORAL_TABLET | Freq: Every day | ORAL | 1 refills | Status: DC

## 2023-01-28 MED ORDER — CYCLOBENZAPRINE HCL 10 MG PO TABS: 5.0000 mg | ORAL_TABLET | Freq: Every evening | ORAL | 0 refills | Status: DC | PRN

## 2023-01-28 MED ORDER — PAROXETINE HCL 40 MG PO TABS: 40.0000 mg | ORAL_TABLET | Freq: Every day | ORAL | 1 refills | Status: DC

## 2023-01-28 MED ORDER — MELOXICAM 15 MG PO TABS: 15.0000 mg | ORAL_TABLET | Freq: Every day | ORAL | 3 refills | Status: DC

## 2023-01-28 NOTE — Assessment & Plan Note (Signed)
Under good control on current regimen. Continue current regimen. Continue to monitor. Call with any concerns. Refills given. Labs drawn today.   

## 2023-01-28 NOTE — Progress Notes (Signed)
BP 134/74   Pulse 73   Temp 97.8 F (36.6 C) (Oral)   Ht 5\' 9"  (1.753 m)   Wt 180 lb 9.6 oz (81.9 kg)   SpO2 97%   BMI 26.67 kg/m    Subjective:    Patient ID: Jacob Key, male    DOB: 1981-04-29, 42 y.o.   MRN: [id number]  HPI: Jacob Key is a 42 y.o. male presenting on 01/28/2023 for comprehensive medical examination. Current medical complaints include:  HYPERLIPIDEMIA Hyperlipidemia status: excellent compliance Satisfied with current treatment?  yes Side effects:  no Medication compliance: excellent compliance Past cholesterol meds: crestor Supplements: none Aspirin:  no The 10-year ASCVD risk score (Arnett DK, et al., 2019) is: 3.6%   Values used to calculate the score:     Age: 64 years     Sex: Male     Is Non-Hispanic African American: No     Diabetic: No     Tobacco smoker: Yes     Systolic Blood Pressure: 134 mmHg     Is BP treated: No     HDL Cholesterol: 62 mg/dL     Total Cholesterol: 202 mg/dL Chest pain:  no Coronary artery disease:  no  DEPRESSION Mood status: controlled Satisfied with current treatment?: yes Symptom severity: mild  Duration of current treatment : chronic Side effects: no Medication compliance: excellent compliance Psychotherapy/counseling: no  Previous psychiatric medications: paxil Depressed mood: no Anxious mood: no Anhedonia: no Significant weight loss or gain: no Insomnia: no  Fatigue: no Feelings of worthlessness or guilt: no Impaired concentration/indecisiveness: no Suicidal ideations: no Hopelessness: no Crying spells: no    01/28/2023    1:07 PM 10/06/2022    9:06 AM 07/30/2022    1:27 PM 01/26/2022    1:05 PM 10/08/2021    8:12 AM  Depression screen PHQ 2/9  Decreased Interest 0 0 0 0 0  Down, Depressed, Hopeless 0 0 1 1 1   PHQ - 2 Score 0 0 1 1 1   Altered sleeping 1 0 0 0 0  Tired, decreased energy 0 0 0 0 0  Change in appetite 0 0 0 0 0  Feeling bad or failure about yourself  0 0 0 0 0  Trouble  concentrating 0 0 0 0 0  Moving slowly or fidgety/restless 0 0 0 0 0  Suicidal thoughts 0 0 0 0 0  PHQ-9 Score 1 0 1 1 1   Difficult doing work/chores Somewhat difficult Not difficult at all Not difficult at all Not difficult at all    ABDOMINAL PAIN  Duration:about a month Onset: sudden Severity: moderate Quality: crampy, muscle pain Location:  generalized, seems more musclar  Episode duration: 10 minutes Radiation: none Frequency: every few days Status: fluctuating Treatments attempted: gas-x Fever: no Nausea: no Vomiting: no Weight loss: no Decreased appetite: no Diarrhea: no Constipation: no Blood in stool: no Heartburn: no Jaundice: no Rash: no Dysuria/urinary frequency: no Hematuria: no History of sexually transmitted disease: no Recurrent NSAID use: no  Interim Problems from his last visit: no  Depression Screen done today and results listed below:     01/28/2023    1:07 PM 10/06/2022    9:06 AM 07/30/2022    1:27 PM 01/26/2022    1:05 PM 10/08/2021    8:12 AM  Depression screen PHQ 2/9  Decreased Interest 0 0 0 0 0  Down, Depressed, Hopeless 0 0 1 1 1   PHQ - 2 Score 0 0 1 1 1  Altered sleeping 1 0 0 0 0  Tired, decreased energy 0 0 0 0 0  Change in appetite 0 0 0 0 0  Feeling bad or failure about yourself  0 0 0 0 0  Trouble concentrating 0 0 0 0 0  Moving slowly or fidgety/restless 0 0 0 0 0  Suicidal thoughts 0 0 0 0 0  PHQ-9 Score 1 0 1 1 1   Difficult doing work/chores Somewhat difficult Not difficult at all Not difficult at all Not difficult at all     Past Medical History:  Past Medical History:  Diagnosis Date   Depression     Surgical History:  Past Surgical History:  Procedure Laterality Date   FACIAL RECONSTRUCTION SURGERY     Lower face   TONSILLECTOMY      Medications:  Current Outpatient Medications on File Prior to Visit  Medication Sig   ibuprofen (ADVIL) 800 MG tablet Take 1 tablet (800 mg total) by mouth every 8 (eight)  hours as needed.   ipratropium (ATROVENT) 0.03 % nasal spray Place 2 sprays into both nostrils every 12 (twelve) hours.   ondansetron (ZOFRAN-ODT) 4 MG disintegrating tablet DISSOLVE 1 TABLET IN MOUTH EVERY 8 HOURS AS NEEDED FOR NAUSEA AND VOMITING   No current facility-administered medications on file prior to visit.    Allergies:  No Known Allergies  Social History:  Social History   Socioeconomic History   Marital status: Single    Spouse name: Not on file   Number of children: Not on file   Years of education: Not on file   Highest education level: Not on file  Occupational History   Not on file  Tobacco Use   Smoking status: Former    Current packs/day: 0.50    Average packs/day: 0.5 packs/day for 8.0 years (4.0 ttl pk-yrs)    Types: Cigarettes, E-cigarettes    Start date: 01/18/2015   Smokeless tobacco: Never  Vaping Use   Vaping status: Every Day  Substance and Sexual Activity   Alcohol use: Yes    Alcohol/week: 20.0 standard drinks of alcohol    Types: 20 Cans of beer per week    Comment: moderate   Drug use: No   Sexual activity: Yes  Other Topics Concern   Not on file  Social History Narrative   Not on file   Social Determinants of Health   Financial Resource Strain: Not on file  Food Insecurity: Not on file  Transportation Needs: Not on file  Physical Activity: Not on file  Stress: Not on file  Social Connections: Unknown (10/27/2021)   Received from [facility]   Social Network    Social Network: Not on file  Intimate Partner Violence: Unknown (09/18/2021)   Received from [facility], [facility]   HITS    Physically Hurt: Not on file    Insult or Talk Down To: Not on file    Threaten Physical Harm: Not on file    Scream or Curse: Not on file   Social History   Tobacco Use  Smoking Status Former   Current packs/day: 0.50   Average packs/day: 0.5 packs/day for 8.0 years (4.0 ttl pk-yrs)   Types: Cigarettes, E-cigarettes    Start date: 01/18/2015  Smokeless Tobacco Never   Social History   Substance and Sexual Activity  Alcohol Use Yes   Alcohol/week: 20.0 standard drinks of alcohol   Types: 20 Cans of beer per week   Comment: moderate  Family History:  Family History  Problem Relation Age of Onset   Hyperlipidemia Mother    Hyperlipidemia Father    Alcohol abuse Maternal Grandmother    Heart disease Maternal Grandmother    Heart disease Maternal Grandfather    Alzheimer's disease Paternal Grandfather     Past medical history, surgical history, medications, allergies, family history and social history reviewed with patient today and changes made to appropriate areas of the chart.   Review of Systems  Constitutional: Negative.   HENT: Negative.    Eyes: Negative.   Respiratory: Negative.    Cardiovascular: Negative.   Gastrointestinal:  Positive for abdominal pain and heartburn. Negative for blood in stool, constipation, diarrhea, melena, nausea and vomiting.  Genitourinary: Negative.   Musculoskeletal:  Positive for myalgias. Negative for back pain, falls, joint pain and neck pain.  Skin: Negative.   Neurological: Negative.   Endo/Heme/Allergies: Negative.   Psychiatric/Behavioral: Negative.     All other ROS negative except what is listed above and in the HPI.      Objective:    BP 134/74   Pulse 73   Temp 97.8 F (36.6 C) (Oral)   Ht 5\' 9"  (1.753 m)   Wt 180 lb 9.6 oz (81.9 kg)   SpO2 97%   BMI 26.67 kg/m   Wt Readings from Last 3 Encounters:  01/28/23 180 lb 9.6 oz (81.9 kg)  10/06/22 179 lb 3.2 oz (81.3 kg)  07/30/22 177 lb 9.6 oz (80.6 kg)    Physical Exam Vitals and nursing note reviewed.  Constitutional:      General: He is not in acute distress.    Appearance: Normal appearance. He is obese. He is not ill-appearing, toxic-appearing or diaphoretic.  HENT:     Head: Normocephalic and atraumatic.     Right Ear: Tympanic membrane, ear canal and external ear normal.  There is no impacted cerumen.     Left Ear: Tympanic membrane, ear canal and external ear normal. There is no impacted cerumen.     Nose: Nose normal. No congestion or rhinorrhea.     Mouth/Throat:     Mouth: Mucous membranes are moist.     Pharynx: Oropharynx is clear. No oropharyngeal exudate or posterior oropharyngeal erythema.  Eyes:     General: No scleral icterus.       Right eye: No discharge.        Left eye: No discharge.     Extraocular Movements: Extraocular movements intact.     Conjunctiva/sclera: Conjunctivae normal.     Pupils: Pupils are equal, round, and reactive to light.  Neck:     Vascular: No carotid bruit.  Cardiovascular:     Rate and Rhythm: Normal rate and regular rhythm.     Pulses: Normal pulses.     Heart sounds: No murmur heard.    No friction rub. No gallop.  Pulmonary:     Effort: Pulmonary effort is normal. No respiratory distress.     Breath sounds: Normal breath sounds. No stridor. No wheezing, rhonchi or rales.  Chest:     Chest wall: No tenderness.  Abdominal:     General: Abdomen is flat. Bowel sounds are normal. There is no distension.     Palpations: Abdomen is soft. There is no mass.     Tenderness: There is no abdominal tenderness. There is no right CVA tenderness, left CVA tenderness, guarding or rebound.     Hernia: No hernia is present.  Genitourinary:    Comments:  Genital exam deferred with shared decision making Musculoskeletal:        General: No swelling, tenderness, deformity or signs of injury.     Cervical back: Normal range of motion and neck supple. No rigidity. No muscular tenderness.     Right lower leg: No edema.     Left lower leg: No edema.  Lymphadenopathy:     Cervical: No cervical adenopathy.  Skin:    General: Skin is warm and dry.     Capillary Refill: Capillary refill takes less than 2 seconds.     Coloration: Skin is not jaundiced or pale.     Findings: No bruising, erythema, lesion or rash.  Neurological:      General: No focal deficit present.     Mental Status: He is alert and oriented to person, place, and time.     Cranial Nerves: No cranial nerve deficit.     Sensory: No sensory deficit.     Motor: No weakness.     Coordination: Coordination normal.     Gait: Gait normal.     Deep Tendon Reflexes: Reflexes normal.  Psychiatric:        Mood and Affect: Mood normal.        Behavior: Behavior normal.        Thought Content: Thought content normal.        Judgment: Judgment normal.     Results for orders placed or performed in visit on 10/06/22  CBC with Differential/Platelet  Result Value Ref Range   WBC 6.2 3.4 - 10.8 x10E3/uL   RBC 4.64 4.14 - 5.80 x10E6/uL   Hemoglobin 14.1 13.0 - 17.7 g/dL   Hematocrit 08.6 57.8 - 51.0 %   MCV 93 79 - 97 fL   MCH 30.4 26.6 - 33.0 pg   MCHC 32.8 31.5 - 35.7 g/dL   RDW 46.9 62.9 - 52.8 %   Platelets 235 150 - 450 x10E3/uL   Neutrophils 70 Not Estab. %   Lymphs 19 Not Estab. %   Monocytes 8 Not Estab. %   Eos 3 Not Estab. %   Basos 0 Not Estab. %   Neutrophils Absolute 4.3 1.4 - 7.0 x10E3/uL   Lymphocytes Absolute 1.2 0.7 - 3.1 x10E3/uL   Monocytes Absolute 0.5 0.1 - 0.9 x10E3/uL   EOS (ABSOLUTE) 0.2 0.0 - 0.4 x10E3/uL   Basophils Absolute 0.0 0.0 - 0.2 x10E3/uL   Immature Granulocytes 0 Not Estab. %   Immature Grans (Abs) 0.0 0.0 - 0.1 x10E3/uL  Comprehensive metabolic panel  Result Value Ref Range   Glucose 118 (H) 70 - 99 mg/dL   BUN 19 6 - 24 mg/dL   Creatinine, Ser 4.13 0.76 - 1.27 mg/dL   eGFR 244 >01 UU/VOZ/3.66   BUN/Creatinine Ratio 23 (H) 9 - 20   Sodium 140 134 - 144 mmol/L   Potassium 4.0 3.5 - 5.2 mmol/L   Chloride 101 96 - 106 mmol/L   CO2 26 20 - 29 mmol/L   Calcium 9.5 8.7 - 10.2 mg/dL   Total Protein 6.8 6.0 - 8.5 g/dL   Albumin 4.3 4.1 - 5.1 g/dL   Globulin, Total 2.5 1.5 - 4.5 g/dL   Albumin/Globulin Ratio 1.7 1.2 - 2.2   Bilirubin Total 0.3 0.0 - 1.2 mg/dL   Alkaline Phosphatase 76 44 - 121 IU/L   AST 27 0  - 40 IU/L   ALT 28 0 - 44 IU/L  CK (Creatine Kinase)  Result Value Ref Range  Total CK 141 49 - 439 U/L  Lyme Disease Serology w/Reflex  Result Value Ref Range   Lyme Total Antibody EIA Negative Negative  Spotted Fever Group Antibodies  Result Value Ref Range   Spotted Fever Group IgG <1:64 Neg:<1:64   Spotted Fever Group IgM <1:64 Neg:<1:64   Result Comment Comment   Ehrlichia Antibody Panel  Result Value Ref Range   E.Chaffeensis (HME) IgG Negative Neg:<1:64   E. Chaffeensis (HME) IgM Titer Negative Neg:<1:20   HGE IgG Titer Negative Neg:<1:64   HGE IgM Titer Negative Neg:<1:20   Result Comment: Comment       Assessment & Plan:   Problem List Items Addressed This Visit       Other   Depression    Under good control on current regimen. Continue current regimen. Continue to monitor. Call with any concerns. Refills given.        Relevant Medications   PARoxetine (PAXIL) 40 MG tablet   Hyperlipidemia    Under good control on current regimen. Continue current regimen. Continue to monitor. Call with any concerns. Refills given. Labs drawn today.       Relevant Medications   rosuvastatin (CRESTOR) 20 MG tablet   Other Visit Diagnoses     Routine general medical examination at a health care facility    -  Primary   Vaccines up to date/declined. Screening labs checked today. Continue diet and exercise. Call with any concerns. Continue to monitor.   Relevant Orders   Comprehensive metabolic panel   CBC with Differential/Platelet   Lipid Panel w/o Chol/HDL Ratio   PSA   TSH   Generalized abdominal pain       Likely muscular. Will check labs and start flexeril. Call if not getting better or getting worse.       LABORATORY TESTING:  Health maintenance labs ordered today as discussed above.   IMMUNIZATIONS:   - Tdap: Tetanus vaccination status reviewed: last tetanus booster within 10 years. - Influenza: Refused - Pneumovax: Not applicable - Prevnar: Not  applicable - COVID: Refused - HPV: Refused - Shingrix vaccine: Not applicable  PATIENT COUNSELING:    Sexuality: Discussed sexually transmitted diseases, partner selection, use of condoms, avoidance of unintended pregnancy  and contraceptive alternatives.   Advised to avoid cigarette smoking.  I discussed with the patient that most people either abstain from alcohol or drink within safe limits (<=14/week and <=4 drinks/occasion for males, <=7/weeks and <= 3 drinks/occasion for females) and that the risk for alcohol disorders and other health effects rises proportionally with the number of drinks per week and how often a drinker exceeds daily limits.  Discussed cessation/primary prevention of drug use and availability of treatment for abuse.   Diet: Encouraged to adjust caloric intake to maintain  or achieve ideal body weight, to reduce intake of dietary saturated fat and total fat, to limit sodium intake by avoiding high sodium foods and not adding table salt, and to maintain adequate dietary potassium and calcium preferably from fresh fruits, vegetables, and low-fat dairy products.    stressed the importance of regular exercise  Injury prevention: Discussed safety belts, safety helmets, smoke detector, smoking near bedding or upholstery.   Dental health: Discussed importance of regular tooth brushing, flossing, and dental visits.   Follow up plan: NEXT PREVENTATIVE PHYSICAL DUE IN 1 YEAR. Return in about 6 months (around 07/31/2023).

## 2023-01-28 NOTE — Assessment & Plan Note (Signed)
Under good control on current regimen. Continue current regimen. Continue to monitor. Call with any concerns. Refills given.   

## 2023-01-29 LAB — TSH: TSH: 1.23 u[IU]/mL (ref 0.450–4.500)

## 2023-01-29 LAB — CBC WITH DIFFERENTIAL/PLATELET
Basophils Absolute: 0 10*3/uL (ref 0.0–0.2)
Basos: 0 %
EOS (ABSOLUTE): 0.2 10*3/uL (ref 0.0–0.4)
Eos: 3 %
Hematocrit: 42.8 % (ref 37.5–51.0)
Hemoglobin: 14.6 g/dL (ref 13.0–17.7)
Immature Grans (Abs): 0 10*3/uL (ref 0.0–0.1)
Immature Granulocytes: 0 %
Lymphocytes Absolute: 1.3 10*3/uL (ref 0.7–3.1)
Lymphs: 22 %
MCH: 31.2 pg (ref 26.6–33.0)
MCHC: 34.1 g/dL (ref 31.5–35.7)
MCV: 92 fL (ref 79–97)
Monocytes Absolute: 0.3 10*3/uL (ref 0.1–0.9)
Monocytes: 5 %
Neutrophils Absolute: 4 10*3/uL (ref 1.4–7.0)
Neutrophils: 70 %
Platelets: 240 10*3/uL (ref 150–450)
RBC: 4.68 x10E6/uL (ref 4.14–5.80)
RDW: 12.4 % (ref 11.6–15.4)
WBC: 5.8 10*3/uL (ref 3.4–10.8)

## 2023-01-29 LAB — COMPREHENSIVE METABOLIC PANEL
ALT: 34 IU/L (ref 0–44)
AST: 34 IU/L (ref 0–40)
Albumin: 4.7 g/dL (ref 4.1–5.1)
Alkaline Phosphatase: 67 IU/L (ref 44–121)
BUN/Creatinine Ratio: 18 (ref 9–20)
BUN: 21 mg/dL (ref 6–24)
Bilirubin Total: 0.5 mg/dL (ref 0.0–1.2)
CO2: 28 mmol/L (ref 20–29)
Calcium: 9.6 mg/dL (ref 8.7–10.2)
Chloride: 102 mmol/L (ref 96–106)
Creatinine, Ser: 1.15 mg/dL (ref 0.76–1.27)
Globulin, Total: 2.5 g/dL (ref 1.5–4.5)
Glucose: 86 mg/dL (ref 70–99)
Potassium: 4.5 mmol/L (ref 3.5–5.2)
Sodium: 142 mmol/L (ref 134–144)
Total Protein: 7.2 g/dL (ref 6.0–8.5)
eGFR: 81 mL/min/{1.73_m2} (ref >59)

## 2023-01-29 LAB — LIPID PANEL W/O CHOL/HDL RATIO
Cholesterol, Total: 187 mg/dL (ref 100–199)
HDL: 56 mg/dL (ref >39)
LDL Chol Calc (NIH): 110 mg/dL — ABNORMAL HIGH (ref 0–99)
Triglycerides: 117 mg/dL (ref 0–149)
VLDL Cholesterol Cal: 21 mg/dL (ref 5–40)

## 2023-01-29 LAB — PSA: Prostate Specific Ag, Serum: 0.7 ng/mL (ref 0.0–4.0)

## 2023-04-25 ENCOUNTER — Other Ambulatory Visit: Payer: Self-pay | Admitting: Family Medicine

## 2023-04-26 NOTE — Telephone Encounter (Signed)
Requested Prescriptions  Pending Prescriptions Disp Refills   meloxicam (MOBIC) 15 MG tablet [Pharmacy Med Name: Meloxicam 15 MG Oral Tablet] 90 tablet 0    Sig: Take 1 tablet by mouth once daily     Analgesics:  COX2 Inhibitors Failed - 04/25/2023  9:19 AM      Failed - Manual Review: Labs are only required if the patient has taken medication for more than 8 weeks.      Passed - HGB in normal range and within 360 days    Hemoglobin  Date Value Ref Range Status  01/28/2023 14.6 13.0 - 17.7 g/dL Final         Passed - Cr in normal range and within 360 days    Creatinine  Date Value Ref Range Status  09/15/2013 1.00 0.60 - 1.30 mg/dL Final   Creatinine, Ser  Date Value Ref Range Status  01/28/2023 1.15 0.76 - 1.27 mg/dL Final         Passed - HCT in normal range and within 360 days    Hematocrit  Date Value Ref Range Status  01/28/2023 42.8 37.5 - 51.0 % Final         Passed - AST in normal range and within 360 days    AST  Date Value Ref Range Status  01/28/2023 34 0 - 40 IU/L Final   SGOT(AST)  Date Value Ref Range Status  09/15/2013 36 15 - 37 Unit/L Final   AST (SGOT) Piccolo, Waived  Date Value Ref Range Status  02/06/2016 33 11 - 38 U/L Final         Passed - ALT in normal range and within 360 days    ALT  Date Value Ref Range Status  01/28/2023 34 0 - 44 IU/L Final   SGPT (ALT)  Date Value Ref Range Status  09/15/2013 27 12 - 78 U/L Final   ALT (SGPT) Piccolo, Waived  Date Value Ref Range Status  02/06/2016 23 10 - 47 U/L Final         Passed - eGFR is 30 or above and within 360 days    EGFR (African American)  Date Value Ref Range Status  09/15/2013 >60  Final   GFR calc Af Amer  Date Value Ref Range Status  04/24/2020 128 >59 mL/min/1.73 Final    Comment:    **In accordance with recommendations from the NKF-ASN Task force,**   [facility] is in the process of updating its eGFR calculation to the   2021 CKD-EPI creatinine equation that estimates  kidney function   without a race variable.    EGFR (Non-African Amer.)  Date Value Ref Range Status  09/15/2013 >60  Final    Comment:    eGFR values <71mL/min/1.73 m2 may be an indication of chronic kidney disease (CKD). Calculated eGFR is useful in patients with stable renal function. The eGFR calculation will not be reliable in acutely ill patients when serum creatinine is changing rapidly. It is not useful in  patients on dialysis. The eGFR calculation may not be applicable to patients at the low and high extremes of body sizes, pregnant women, and vegetarians.    GFR calc non Af Amer  Date Value Ref Range Status  04/24/2020 111 >59 mL/min/1.73 Final   GFR  Date Value Ref Range Status  01/01/2014 83.72 >60.00 mL/min Final   eGFR  Date Value Ref Range Status  01/28/2023 81 >59 mL/min/1.73 Final         Passed -  Patient is not pregnant      Passed - Valid encounter within last 12 months    Recent Outpatient Visits           2 months ago Routine general medical examination at a health care facility   [facility] [place], [place] P, DO   6 months ago Myalgia   [facility] [place], Megan P, DO   9 months ago Mixed hyperlipidemia   [facility] M[place], Oswaldo Conroy, PA-C   9 months ago Cellulitis, face   [place] [facility] [place], Megan P, DO   1 year ago Routine general medical examination at a health care facility   [facility] Dorcas Carrow, DO       Future Appointments             In 3 months Dorcas Carrow, DO [place] [facility]

## 2023-08-01 ENCOUNTER — Encounter: Payer: Self-pay | Admitting: Family Medicine

## 2023-08-01 ENCOUNTER — Ambulatory Visit: Payer: No Typology Code available for payment source | Admitting: Family Medicine

## 2023-08-01 VITALS — BP 134/72 | HR 69 | Temp 97.7°F | Ht 69.0 in | Wt 184.2 lb

## 2023-08-01 DIAGNOSIS — E782 Mixed hyperlipidemia: Secondary | ICD-10-CM

## 2023-08-01 DIAGNOSIS — F3342 Major depressive disorder, recurrent, in full remission: Secondary | ICD-10-CM

## 2023-08-01 DIAGNOSIS — M4726 Other spondylosis with radiculopathy, lumbar region: Secondary | ICD-10-CM | POA: Diagnosis not present

## 2023-08-01 MED ORDER — MELOXICAM 15 MG PO TABS: 15.0000 mg | ORAL_TABLET | Freq: Every day | ORAL | 1 refills | Status: DC

## 2023-08-01 MED ORDER — PAROXETINE HCL 40 MG PO TABS: 40.0000 mg | ORAL_TABLET | Freq: Every day | ORAL | 1 refills | Status: DC

## 2023-08-01 MED ORDER — CYCLOBENZAPRINE HCL 10 MG PO TABS: 5.0000 mg | ORAL_TABLET | Freq: Every evening | ORAL | 3 refills | Status: DC | PRN

## 2023-08-01 MED ORDER — ROSUVASTATIN CALCIUM 20 MG PO TABS: 20.0000 mg | ORAL_TABLET | Freq: Every day | ORAL | 1 refills | Status: DC

## 2023-08-01 NOTE — Assessment & Plan Note (Signed)
 Under good control on current regimen. Continue current regimen. Continue to monitor. Call with any concerns. Refills given.

## 2023-08-01 NOTE — Assessment & Plan Note (Signed)
 Under good control on current regimen. Continue current regimen. Continue to monitor. Call with any concerns. Refills given. Labs drawn today.

## 2023-08-01 NOTE — Progress Notes (Signed)
BP 134/72   Pulse 69   Temp 97.7 F (36.5 C) (Oral)   Ht 5\' 9"  (1.753 m)   Wt 184 lb 3.2 oz (83.6 kg)   SpO2 96%   BMI 27.20 kg/m    Subjective:    Patient ID: Jacob Key, Jacob Key    DOB: December 23, 1980, 43 y.o.   MRN: [id number]  HPI: Jacob Key is a 43 y.o. Jacob Key  Chief Complaint  Patient presents with   Hyperlipidemia   Depression   HYPERLIPIDEMIA Hyperlipidemia status: excellent compliance Satisfied with current treatment?  yes Side effects:  no Medication compliance: excellent compliance Past cholesterol meds: crestor Supplements: none Aspirin:  no The 10-year ASCVD risk score (Arnett DK, et al., 2019) is: 1.2%   Values used to calculate the score:     Age: 47 years     Sex: Jacob Key     Is Non-Hispanic African American: No     Diabetic: No     Tobacco smoker: No     Systolic Blood Pressure: 134 mmHg     Is BP treated: No     HDL Cholesterol: 56 mg/dL     Total Cholesterol: 187 mg/dL Chest pain:  no Coronary artery disease:  no Family history CAD:  yes  DEPRESSION Mood status: controlled Satisfied with current treatment?: yes Symptom severity: mild  Duration of current treatment : chronic Side effects: no Medication compliance: excellent compliance Psychotherapy/counseling: no  Previous psychiatric medications: paxil Depressed mood: no Anxious mood: no Anhedonia: no Significant weight loss or gain: no Insomnia: no  Fatigue: no Feelings of worthlessness or guilt: no Impaired concentration/indecisiveness: no Suicidal ideations: no Hopelessness: no Crying spells: no    08/01/2023    1:19 PM 08/01/2023    1:15 PM 01/28/2023    1:07 PM 10/06/2022    9:06 AM 07/30/2022    1:27 PM  Depression screen PHQ 2/9  Decreased Interest 0 0 0 0 0  Down, Depressed, Hopeless 0 0 0 0 1  PHQ - 2 Score 0 0 0 0 1  Altered sleeping 0 0 1 0 0  Tired, decreased energy 0 0 0 0 0  Change in appetite 0 0 0 0 0  Feeling bad or failure about yourself  0  0 0 0  Trouble  concentrating 0 0 0 0 0  Moving slowly or fidgety/restless 0 0 0 0 0  Suicidal thoughts 0 0 0 0 0  PHQ-9 Score 0 0 1 0 1  Difficult doing work/chores Not difficult at all  Somewhat difficult Not difficult at all Not difficult at all   BACK PAIN Duration: chronic Mechanism of injury: unknown Location: L>R and low back Onset: gradual Severity: moderate Quality: shooting Frequency: intermittent Radiation: L leg down to his calf Aggravating factors: lifting Alleviating factors: heating pad, patches Status: worse Treatments attempted: meloxicam, icy hot, stretches  Relief with NSAIDs?: mild Nighttime pain:  no Paresthesias / decreased sensation:  yes Bowel / bladder incontinence:  no Fevers:  no Dysuria / urinary frequency:  no   Relevant past medical, surgical, family and social history reviewed and updated as indicated. Interim medical history since our last visit reviewed. Allergies and medications reviewed and updated.  Review of Systems  Constitutional: Negative.   Respiratory: Negative.    Cardiovascular: Negative.   Musculoskeletal:  Positive for back pain and myalgias. Negative for arthralgias, gait problem, joint swelling, neck pain and neck stiffness.  Skin: Negative.     Per HPI unless specifically indicated  above     Objective:    BP 134/72   Pulse 69   Temp 97.7 F (36.5 C) (Oral)   Ht 5\' 9"  (1.753 m)   Wt 184 lb 3.2 oz (83.6 kg)   SpO2 96%   BMI 27.20 kg/m   Wt Readings from Last 3 Encounters:  08/01/23 184 lb 3.2 oz (83.6 kg)  01/28/23 180 lb 9.6 oz (81.9 kg)  10/06/22 179 lb 3.2 oz (81.3 kg)    Physical Exam Vitals and nursing note reviewed.  Constitutional:      General: He is not in acute distress.    Appearance: Normal appearance. He is not ill-appearing, toxic-appearing or diaphoretic.  HENT:     Head: Normocephalic and atraumatic.     Right Ear: External ear normal.     Left Ear: External ear normal.     Nose: Nose normal.      Mouth/Throat:     Mouth: Mucous membranes are moist.     Pharynx: Oropharynx is clear.  Eyes:     General: No scleral icterus.       Right eye: No discharge.        Left eye: No discharge.     Extraocular Movements: Extraocular movements intact.     Conjunctiva/sclera: Conjunctivae normal.     Pupils: Pupils are equal, round, and reactive to light.  Cardiovascular:     Rate and Rhythm: Normal rate and regular rhythm.     Pulses: Normal pulses.     Heart sounds: Normal heart sounds. No murmur heard.    No friction rub. No gallop.  Pulmonary:     Effort: Pulmonary effort is normal. No respiratory distress.     Breath sounds: Normal breath sounds. No stridor. No wheezing, rhonchi or rales.  Chest:     Chest wall: No tenderness.  Musculoskeletal:        General: Normal range of motion.     Cervical back: Normal range of motion and neck supple.  Skin:    General: Skin is warm and dry.     Capillary Refill: Capillary refill takes less than 2 seconds.     Coloration: Skin is not jaundiced or pale.     Findings: No bruising, erythema, lesion or rash.  Neurological:     General: No focal deficit present.     Mental Status: He is alert and oriented to person, place, and time. Mental status is at baseline.  Psychiatric:        Mood and Affect: Mood normal.        Behavior: Behavior normal.        Thought Content: Thought content normal.        Judgment: Judgment normal.     Results for orders placed or performed in visit on 01/28/23  Comprehensive metabolic panel   Collection Time: 01/28/23  1:24 PM  Result Value Ref Range   Glucose 86 70 - 99 mg/dL   BUN 21 6 - 24 mg/dL   Creatinine, Ser 4.09 0.76 - 1.27 mg/dL   eGFR 81 >81 XB/JYN/8.29   BUN/Creatinine Ratio 18 9 - 20   Sodium 142 134 - 144 mmol/L   Potassium 4.5 3.5 - 5.2 mmol/L   Chloride 102 96 - 106 mmol/L   CO2 28 20 - 29 mmol/L   Calcium 9.6 8.7 - 10.2 mg/dL   Total Protein 7.2 6.0 - 8.5 g/dL   Albumin 4.7 4.1 - 5.1  g/dL   Globulin, Total 2.5  1.5 - 4.5 g/dL   Bilirubin Total 0.5 0.0 - 1.2 mg/dL   Alkaline Phosphatase 67 44 - 121 IU/L   AST 34 0 - 40 IU/L   ALT 34 0 - 44 IU/L  CBC with Differential/Platelet   Collection Time: 01/28/23  1:24 PM  Result Value Ref Range   WBC 5.8 3.4 - 10.8 x10E3/uL   RBC 4.68 4.14 - 5.80 x10E6/uL   Hemoglobin 14.6 13.0 - 17.7 g/dL   Hematocrit 16.1 09.6 - 51.0 %   MCV 92 79 - 97 fL   MCH 31.2 26.6 - 33.0 pg   MCHC 34.1 31.5 - 35.7 g/dL   RDW 04.5 40.9 - 81.1 %   Platelets 240 150 - 450 x10E3/uL   Neutrophils 70 Not Estab. %   Lymphs 22 Not Estab. %   Monocytes 5 Not Estab. %   Eos 3 Not Estab. %   Basos 0 Not Estab. %   Neutrophils Absolute 4.0 1.4 - 7.0 x10E3/uL   Lymphocytes Absolute 1.3 0.7 - 3.1 x10E3/uL   Monocytes Absolute 0.3 0.1 - 0.9 x10E3/uL   EOS (ABSOLUTE) 0.2 0.0 - 0.4 x10E3/uL   Basophils Absolute 0.0 0.0 - 0.2 x10E3/uL   Immature Granulocytes 0 Not Estab. %   Immature Grans (Abs) 0.0 0.0 - 0.1 x10E3/uL  Lipid Panel w/o Chol/HDL Ratio   Collection Time: 01/28/23  1:24 PM  Result Value Ref Range   Cholesterol, Total 187 100 - 199 mg/dL   Triglycerides 914 0 - 149 mg/dL   HDL 56 >78 mg/dL   VLDL Cholesterol Cal 21 5 - 40 mg/dL   LDL Chol Calc (NIH) 295 (H) 0 - 99 mg/dL  PSA   Collection Time: 01/28/23  1:24 PM  Result Value Ref Range   Prostate Specific Ag, Serum 0.7 0.0 - 4.0 ng/mL  TSH   Collection Time: 01/28/23  1:24 PM  Result Value Ref Range   TSH 1.230 0.450 - 4.500 uIU/mL      Assessment & Plan:   Problem List Items Addressed This Visit       Nervous and Auditory   Osteoarthritis of spine with radiculopathy, lumbar region   Acting up again. Refill of meloxicam and flexeril given. Referral to PT placed. Follow up 2 months. Call with any concerns.       Relevant Medications   cyclobenzaprine (FLEXERIL) 10 MG tablet   meloxicam (MOBIC) 15 MG tablet   PARoxetine (PAXIL) 40 MG tablet   Other Relevant Orders   Ambulatory  referral to Physical Therapy     Other   Depression   Under good control on current regimen. Continue current regimen. Continue to monitor. Call with any concerns. Refills given.        Relevant Medications   PARoxetine (PAXIL) 40 MG tablet   Hyperlipidemia - Primary   Under good control on current regimen. Continue current regimen. Continue to monitor. Call with any concerns. Refills given. Labs drawn today.       Relevant Medications   rosuvastatin (CRESTOR) 20 MG tablet   Other Relevant Orders   Comprehensive metabolic panel   Lipid Panel w/o Chol/HDL Ratio     Follow up plan: Return in about 2 months (around 09/29/2023) for follow up back.

## 2023-08-01 NOTE — Assessment & Plan Note (Signed)
Acting up again. Refill of meloxicam and flexeril given. Referral to PT placed. Follow up 2 months. Call with any concerns.

## 2023-08-02 ENCOUNTER — Encounter: Payer: Self-pay | Admitting: Family Medicine

## 2023-08-02 LAB — COMPREHENSIVE METABOLIC PANEL
ALT: 25 [IU]/L (ref 0–44)
AST: 27 [IU]/L (ref 0–40)
Albumin: 4.8 g/dL (ref 4.1–5.1)
Alkaline Phosphatase: 66 [IU]/L (ref 44–121)
BUN/Creatinine Ratio: 16 (ref 9–20)
BUN: 19 mg/dL (ref 6–24)
Bilirubin Total: 0.4 mg/dL (ref 0.0–1.2)
CO2: 31 mmol/L — ABNORMAL HIGH (ref 20–29)
Calcium: 9.7 mg/dL (ref 8.7–10.2)
Chloride: 99 mmol/L (ref 96–106)
Creatinine, Ser: 1.22 mg/dL (ref 0.76–1.27)
Globulin, Total: 2.6 g/dL (ref 1.5–4.5)
Glucose: 79 mg/dL (ref 70–99)
Potassium: 4.5 mmol/L (ref 3.5–5.2)
Sodium: 143 mmol/L (ref 134–144)
Total Protein: 7.4 g/dL (ref 6.0–8.5)
eGFR: 76 mL/min/{1.73_m2} (ref >59)

## 2023-08-02 LAB — LIPID PANEL W/O CHOL/HDL RATIO
Cholesterol, Total: 196 mg/dL (ref 100–199)
HDL: 62 mg/dL (ref >39)
LDL Chol Calc (NIH): 111 mg/dL — ABNORMAL HIGH (ref 0–99)
Triglycerides: 134 mg/dL (ref 0–149)
VLDL Cholesterol Cal: 23 mg/dL (ref 5–40)

## 2023-08-16 ENCOUNTER — Ambulatory Visit: Payer: No Typology Code available for payment source | Attending: Family Medicine

## 2023-08-16 DIAGNOSIS — M79605 Pain in left leg: Secondary | ICD-10-CM | POA: Insufficient documentation

## 2023-08-16 DIAGNOSIS — M4726 Other spondylosis with radiculopathy, lumbar region: Secondary | ICD-10-CM | POA: Diagnosis not present

## 2023-08-16 DIAGNOSIS — M5459 Other low back pain: Secondary | ICD-10-CM | POA: Diagnosis not present

## 2023-08-16 DIAGNOSIS — M25552 Pain in left hip: Secondary | ICD-10-CM | POA: Diagnosis not present

## 2023-08-16 NOTE — Therapy (Unsigned)
 OUTPATIENT PHYSICAL THERAPY THORACOLUMBAR EVALUATION   Patient Name: Kurtis Anastasia MRN: [id number] DOB:Oct 13, 1980, 43 y.o., male Today's Date: 08/17/2023  END OF SESSION:  PT End of Session - 08/17/23 0807     Visit Number 1    Number of Visits 17    Date for PT Re-Evaluation 10/11/23    PT Start Time 1430    PT Stop Time 1515    PT Time Calculation (min) 45 min    Behavior During Therapy [facility] for tasks assessed/performed             Past Medical History:  Diagnosis Date   Depression    Past Surgical History:  Procedure Laterality Date   FACIAL RECONSTRUCTION SURGERY     Lower face   TONSILLECTOMY     Patient Active Problem List   Diagnosis Date Noted   Osteoarthritis of spine with radiculopathy, lumbar region 08/01/2023   Insect bite 02/25/2021   Chronic daily headache 10/10/2020   Cigarette smoker 08/26/2019   Hyperlipidemia 03/24/2015   Depression 01/01/2014   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 male referred to OPPT for LBP.  PERTINENT HISTORY:  Pt reports in general in the last year his lower back has worsened. Reports worsening radicular type referred pain to B lower back, lateral LLE down to anterolateral ankle. Has gotten to the point where he is being woken up due to his pain. Usually is a back sleeper. Denies N/T tingling. But reports shooting and aching pain. Does have  intermittent, unilateral lower back pain L > R. Denies pain with stairs, has maybe intermittent pain with transferring to standing from lowered surfaces. Works at [employer] donated items. Mild pain exacerbation with lifting heavy items. Mostly hurts with prolonged standing. PRN use of back brace and finds the support helps. New shoe wear also helps. Does endorse morning stiffness that is in the similar area as his concordant issue. Has not had exacerbation with running, it actually has alleviated his pain. Worst  pain: 7/10 NPS, best pain: < 5/10 NPS , currently 5-6/10 NPS. Never fully pain free.  PAIN:  Are you having pain? Yes: NPRS scale: 5-6/10 Pain location: L sided paraspinals/upper glut med and max and lateral LLE to knee Pain description: Dull and achey  Aggravating factors: Prolonged standing, STS transfers Relieving factors: Running, back brace, new shoe wear  PRECAUTIONS: None  RED FLAGS: None   WEIGHT BEARING RESTRICTIONS: No  FALLS:  Has patient fallen in last 6 months? No  LIVING ENVIRONMENT: Lives with: lives alone  OCCUPATION: Works at [employer]   PLOF: Independent  PATIENT GOALS: Improve pain   NEXT MD VISIT: N/A  OBJECTIVE:  Note: Objective measures were completed at Evaluation unless otherwise noted.  DIAGNOSTIC FINDINGS:  N/A  PATIENT SURVEYS:  LEFS 55/80  COGNITION: Overall cognitive status: Within functional limits for tasks assessed     SENSATION: Light touch: WFL  MUSCLE LENGTH: Hamstrings: Right 95 deg; Left 89 deg*  POSTURE: No Significant postural limitations  PALPATION: TTP along greater trochanter, upper glut med on LLE. Mild tenderness along IT band   LUMBAR ROM:   AROM eval  Flexion 100%  Extension 100%  Right lateral flexion 100%  Left lateral flexion 100%  Right rotation 100%  Left rotation 100%   (Blank rows = not tested)  LOWER EXTREMITY ROM:     Active  Right eval Left eval  Hip flexion    Hip  extension    Hip abduction    Hip adduction    Hip internal rotation 45 32*  Hip external rotation 45 35*  Knee flexion    Knee extension    Ankle dorsiflexion    Ankle plantarflexion    Ankle inversion    Ankle eversion     (Blank rows = not tested)  LOWER EXTREMITY MMT:    MMT Right eval Left eval  Hip flexion 5 5*  Hip extension 5 4  Hip abduction 5 4  Hip adduction    Hip internal rotation 4 4  Hip external rotation 5 5*  Knee flexion 5 5  Knee extension 5 5  Ankle dorsiflexion 5 5  Ankle plantarflexion 5 5  Ankle inversion    Ankle eversion     (Blank rows = not tested)  LUMBAR SPECIAL TESTS:  Straight leg raise test: Negative, Slump test: Negative, SI Compression/distraction test: Negative, FABER test: Positive, and Gaenslen's test: Positive FADDIR: Positive  Sacral Thrust: Negative  Ober's: Negative.  External De-rotation: Positive  Hip scour: Positive   JOINT MOBILITY: Lumbar CPA's and UPA's normal and painless  Hip: A/P hypomobile and concordant on LLE.   FUNCTIONAL TESTS:  Squat: minimal hip hinge in squat  GAIT: Distance walked: 10 meters Assistive device utilized: None Level of assistance: Complete Independence Comments: Normal, reciprocal gait  TREATMENT DATE: 08/17/23 Eval only. Reviewed HEP with education on Reps/sets/frequency  PATIENT EDUCATION:  Education details: POC, prognosis, PT diagnosis, HEP  Person educated: Patient Education method: Explanation, Demonstration, and Handouts Education comprehension: verbalized understanding and needs further education  HOME EXERCISE PROGRAM: Access Code: [email or web address] URL: [email or web address] Date: 08/17/2023 Prepared by: Ronnie Derby  Exercises - Supine Hamstring Stretch with Strap  - 1 x daily - 7 x weekly - 1 sets - 3 reps - 30 hold - Prone Hip  Extension  - 1 x daily - 7 x weekly - 3 sets - 8 reps - Sidelying ITB Stretch off Table  - 1 x daily - 7 x weekly - 1 sets - 3 reps - 30 hold - Half Kneeling ITB/TFL Stretch  - 1 x daily - 7 x weekly - 1 sets - 3 reps - 30 hold  ASSESSMENT:  CLINICAL IMPRESSION: Patient is a 43 y.o. male who was seen today for physical therapy evaluation and treatment for LBP. Pt's examination more conducive and consistent with hip pathology versus lumbar. Pt has normal lumbar AROM in all planes and painless. Pt has concordant pain with Slump/SLR but MSK based pain versus nerve related pain as he has no N/T, burning, or other nerve type pain with testing. Pt demonstrates deficits in hip IR/ER AROM, painful L hip joint mobility/scour testing, painful hip ER with resistance, and weakness in hip abduction and extension. Pt also TTP along greater trochanter and IT band. These signs/symptoms are consistent with IT band related pain, possible hip joint pain, and/or possible gluteal tendinopathy. These deficits are leading to limitations in sleep, pain with job related tasks like prolonged standing, walking, and heavy lifting. Pt will benefit from skilled PT treatment to address these deficits and maximize return to PLOF.    OBJECTIVE IMPAIRMENTS: decreased mobility, decreased ROM, decreased strength, hypomobility, and pain.   ACTIVITY LIMITATIONS: carrying, lifting, standing, sleeping, transfers, and locomotion level  PARTICIPATION LIMITATIONS: community activity and occupation  PERSONAL FACTORS: Age, Fitness, Past/current experiences, Profession, and Time since onset of injury/illness/exacerbation are also affecting patient's functional outcome.   REHAB POTENTIAL: Good  CLINICAL DECISION MAKING: Stable/uncomplicated  EVALUATION COMPLEXITY: Low   GOALS: Goals reviewed with patient? No  SHORT TERM GOALS: Target date: 09/13/23  Pt will be independent with HEP to improve hip mobility, pain, and strength.   Baseline: 08/16/23: provided initial HEP Goal status: INITIAL  LONG TERM GOALS: Target date: 10/11/23  Pt will improve LEFS by at least 9 points to demonstrate clinically significant reduction in disability due to L hip pain.  Baseline: 08/16/23: 55/80 Goal status: INITIAL  2.  Pt will improve L hip ER/IR AROM to RLE norms to improve painful hypomobility and transfers for work and household ADL's.  Baseline: 08/17/23:  Hip internal rotation 45 32*  Hip external rotation 45 35*   Goal status: INITIAL  3.  Pt will report worst pain as a 3/10 NPS or less with work related tasks to demonstrate clinically significant improvement in pain.  Baseline: 08/17/23: worst pain 7/10 NPS, averaging 5/10 NPS on a daily basis.  Goal status: INITIAL  4.  Pt will report ability to sleep 6-8 hours without being woken up by his hip pain to improve sleep quality.  Baseline: 08/16/33: wakes him up in the middle of the night regularly.  Goal status: INITIAL PLAN:  PT FREQUENCY: 1-2x/week  PT DURATION: 8 weeks  PLANNED INTERVENTIONS: 97164- PT Re-evaluation, 97110-Therapeutic exercises, 97530- Therapeutic activity, [id number]- Neuromuscular re-education, 97535- Self Care, 16109- Manual therapy, [id number]- Electrical stimulation (unattended), [id number]- Electrical stimulation (  manual), 25956- Traction (mechanical), Patient/Family education, Balance training, Taping, Dry Needling, Joint mobilization, Joint manipulation, Spinal manipulation, Spinal mobilization, Cryotherapy, and Moist heat.  PLAN FOR NEXT SESSION: Review HEP, improve L hip strength, L hip ER/IR mobility, hamstring flexibility    Delphia Grates. Fairly IV, PT, DPT Physical Therapist- [place]  [facility]  08/17/2023, 8:41 AM

## 2023-08-18 ENCOUNTER — Ambulatory Visit: Payer: No Typology Code available for payment source

## 2023-08-23 ENCOUNTER — Ambulatory Visit: Payer: No Typology Code available for payment source

## 2023-08-25 ENCOUNTER — Ambulatory Visit: Payer: No Typology Code available for payment source

## 2023-08-29 ENCOUNTER — Ambulatory Visit: Payer: No Typology Code available for payment source

## 2023-08-29 DIAGNOSIS — M25552 Pain in left hip: Secondary | ICD-10-CM

## 2023-08-29 DIAGNOSIS — M5459 Other low back pain: Secondary | ICD-10-CM | POA: Diagnosis not present

## 2023-08-29 DIAGNOSIS — M4726 Other spondylosis with radiculopathy, lumbar region: Secondary | ICD-10-CM | POA: Diagnosis not present

## 2023-08-29 DIAGNOSIS — M79605 Pain in left leg: Secondary | ICD-10-CM

## 2023-08-29 NOTE — Therapy (Unsigned)
 OUTPATIENT PHYSICAL THERAPY THORACOLUMBAR TREATMENT   Patient Name: Jacob Key MRN: [id number] DOB:1981-04-17, 43 y.o., male Today's Date: 08/30/2023  END OF SESSION:  PT End of Session - 08/29/23 1817     Visit Number 2    Number of Visits 17    Date for PT Re-Evaluation 10/11/23    PT Start Time 1817    PT Stop Time 1856    PT Time Calculation (min) 39 min    Activity Tolerance Patient tolerated treatment well    Behavior During Therapy [facility] for tasks assessed/performed             Past Medical History:  Diagnosis Date   Depression    Past Surgical History:  Procedure Laterality Date   FACIAL RECONSTRUCTION SURGERY     Lower face   TONSILLECTOMY     Patient Active Problem List   Diagnosis Date Noted   Osteoarthritis of spine with radiculopathy, lumbar region 08/01/2023   Insect bite 02/25/2021   Chronic daily headache 10/10/2020   Cigarette smoker 08/26/2019   Hyperlipidemia 03/24/2015   Depression 01/01/2014   Alcohol consumption four to six days per week 01/01/2014    PCP: Dorcas Carrow, DO  REFERRING PROVIDER: Dorcas Carrow, DO  REFERRING DIAG: [phone] (ICD-10-CM) - Osteoarthritis of spine with radiculopathy, lumbar region  Rationale for Evaluation and Treatment: Rehabilitation  THERAPY DIAG:  Pain in left hip  Pain in left leg  Other low back pain  ONSET DATE: 4-5 years ago   SUBJECTIVE:                                                                                                                                                                                           SUBJECTIVE STATEMENT: Pt reports current pain 5/10 NPS due to work. Compliant with HEP. Has had days with mild pain levels.   PERTINENT HISTORY:  Pt reports in general in the last year his lower back has worsened. Reports worsening radicular type referred pain to B lower back, lateral LLE down to anterolateral ankle. Has gotten to the point where he is being woken up  due to his pain. Usually is a back sleeper. Denies N/T tingling. But reports shooting and aching pain. Does have intermittent, unilateral lower back pain L > R. Denies pain with stairs, has maybe intermittent pain with transferring to standing from lowered surfaces. Works at [employer] donated items. Mild pain exacerbation with lifting heavy items. Mostly hurts with prolonged standing. PRN use of back brace and finds the support helps. New shoe wear also helps. Does endorse morning stiffness that is in the similar  area as his concordant issue. Has not had exacerbation with running, it actually has alleviated his pain. Worst pain: 7/10 NPS, best pain: < 5/10 NPS , currently 5-6/10 NPS. Never fully pain free.  PAIN:  Are you having pain? Yes: NPRS scale: 5/10 Pain location: L sided paraspinals/upper glut med and max and lateral LLE to knee Pain description: Dull and achey  Aggravating factors: Prolonged standing, STS transfers Relieving factors: Running, back brace, new shoe wear  PRECAUTIONS: None  RED FLAGS: None   WEIGHT BEARING RESTRICTIONS: No  FALLS:  Has patient fallen in last 6 months? No  LIVING ENVIRONMENT: Lives with: lives alone  OCCUPATION: Works at [employer]   PLOF: Independent  PATIENT GOALS: Improve pain   NEXT MD VISIT: N/A  OBJECTIVE:  Note: Objective measures were completed at Evaluation unless otherwise noted.  DIAGNOSTIC FINDINGS:  N/A  PATIENT SURVEYS:  LEFS 55/80  COGNITION: Overall cognitive status: Within functional limits for tasks assessed     SENSATION: Light touch: WFL  MUSCLE LENGTH: Hamstrings: Right 95 deg; Left 89 deg*  POSTURE: No Significant postural limitations  PALPATION: TTP along greater trochanter, upper glut med on LLE. Mild tenderness along IT band   LUMBAR ROM:   AROM eval  Flexion 100%  Extension 100%  Right lateral flexion 100%  Left lateral flexion 100%  Right rotation 100%  Left rotation 100%    (Blank rows = not tested)  LOWER EXTREMITY ROM:     Active  Right eval Left eval  Hip flexion    Hip extension    Hip abduction    Hip adduction    Hip internal rotation 45 32*  Hip external rotation 45 35*  Knee flexion    Knee extension    Ankle dorsiflexion    Ankle plantarflexion    Ankle inversion    Ankle eversion     (Blank rows = not tested)  LOWER EXTREMITY MMT:    MMT Right eval Left eval  Hip flexion 5 5*  Hip extension 5 4  Hip abduction 5 4  Hip adduction    Hip internal rotation 4 4  Hip external rotation 5 5*  Knee flexion 5 5  Knee extension 5 5  Ankle dorsiflexion 5 5  Ankle plantarflexion 5 5  Ankle inversion    Ankle eversion     (Blank rows = not tested)  LUMBAR SPECIAL TESTS:  Straight leg raise test: Negative, Slump test: Negative, SI Compression/distraction test: Negative, FABER test: Positive, and Gaenslen's test: Positive FADDIR: Positive  Sacral Thrust: Negative  Ober's: Negative.  External De-rotation: Positive  Hip scour: Positive   JOINT MOBILITY: Lumbar CPA's and UPA's normal and painless  Hip: A/P hypomobile and concordant on LLE.   FUNCTIONAL TESTS:  Squat: minimal hip hinge in squat  GAIT: Distance walked: 10 meters Assistive device utilized: None Level of assistance: Complete Independence Comments: Normal, reciprocal gait  TREATMENT DATE: 08/29/23  There.ex:   Reviewed HEP as listed below:  Access Code: [email or web address] URL: [email or web address] Date: 08/17/2023 Prepared by: Ronnie Derby  Exercises -*LLE only Supine Hamstring Stretch with Strap  - 1 x daily - 7 x weekly - 1 sets - 3 reps - 30 hold - Prone Hip Extension  - 1 x daily - 7 x weekly - 3 sets - 8 reps - *Right side lying, LLE only Side lying ITB Stretch off Table  - 1 x daily - 7 x weekly - 1 sets - 3 reps - 30 hold -  Half Kneeling ITB/TFL Stretch  - 1 x daily - 7 x weekly - 1 sets - 3 reps - 30 hold   There.Act: [place] Split Squat:  2x6/LE. VC's for form/technique.   Forward/backward monster walks: Blue TB, 3x20/direction   SLS on airex pad 3x30 sec/LE, SBA. Notable ankle righting reactions on LLE > RLE.                                  PATIENT EDUCATION:  Education details: POC, prognosis, PT diagnosis, HEP  Person educated: Patient Education method: Explanation, Demonstration, and Handouts Education comprehension: verbalized understanding and needs further education  HOME EXERCISE PROGRAM: Access Code: [email or web address] URL: [email or web address] Date: 08/17/2023 Prepared by: Ronnie Derby  Exercises - Supine Hamstring Stretch with Strap  - 1 x daily - 7 x weekly - 1 sets - 3 reps - 30 hold - Prone Hip Extension  - 1 x daily - 7 x weekly - 3 sets - 8 reps - Sidelying ITB Stretch off Table  - 1 x daily - 7 x weekly - 1 sets - 3 reps - 30 hold - Half Kneeling ITB/TFL Stretch  - 1 x daily - 7 x weekly - 1 sets - 3 reps - 30 hold  ASSESSMENT:  CLINICAL IMPRESSION: Pt arriving for first PT treatment. Fair to good understanding of HEP appreciated with min multimodal cuing with good carryover. Continuing to address hip flexibility and glut med/max weakness in functional positions. Pt overall with good tolerance for today's session endorsing pain relief with exercise. Pt continues to have difficulty with pain in prolonged standing at work. Pt will benefit from skilled PT treatment to address these deficits and maximize return to PLOF.    OBJECTIVE IMPAIRMENTS: decreased mobility, decreased ROM, decreased strength, hypomobility, and pain.   ACTIVITY LIMITATIONS: carrying, lifting, standing, sleeping, transfers, and locomotion level  PARTICIPATION LIMITATIONS: community activity and occupation  PERSONAL FACTORS: Age, Fitness, Past/current experiences, Profession, and Time since onset of injury/illness/exacerbation are also affecting patient's functional outcome.   REHAB POTENTIAL: Good  CLINICAL DECISION  MAKING: Stable/uncomplicated  EVALUATION COMPLEXITY: Low   GOALS: Goals reviewed with patient? No  SHORT TERM GOALS: Target date: 09/13/23  Pt will be independent with HEP to improve hip mobility, pain, and strength.  Baseline: 08/16/23: provided initial HEP Goal status: INITIAL  LONG TERM GOALS: Target date: 10/11/23  Pt will improve LEFS by at least 9 points to demonstrate clinically significant reduction in disability due to L hip pain.  Baseline: 08/16/23: 55/80 Goal status: INITIAL  2.  Pt will improve L hip ER/IR AROM to RLE norms to improve painful hypomobility and transfers for work and household ADL's.  Baseline: 08/17/23:  Hip internal rotation 45 32*  Hip external rotation 45 35*   Goal status: INITIAL  3.  Pt will report worst pain as a 3/10 NPS or less with work related tasks to demonstrate clinically significant improvement in pain.  Baseline: 08/17/23: worst pain 7/10 NPS, averaging 5/10 NPS on a daily basis.  Goal status: INITIAL  4.  Pt will report ability to sleep 6-8 hours without being woken up by his hip pain to improve sleep quality.  Baseline: 08/16/33: wakes him up in the middle of the night regularly.  Goal status: INITIAL PLAN:  PT FREQUENCY: 1-2x/week  PT DURATION: 8 weeks  PLANNED INTERVENTIONS: 97164- PT Re-evaluation, 97110-Therapeutic exercises, 97530- Therapeutic activity, [id number]- Neuromuscular re-education, 97535- Self Care,  40102- Manual therapy, G0283- Electrical stimulation (unattended), [id number]- Electrical stimulation (manual), 72536- Traction (mechanical), Patient/Family education, Balance training, Taping, Dry Needling, Joint mobilization, Joint manipulation, Spinal manipulation, Spinal mobilization, Cryotherapy, and Moist heat.  PLAN FOR NEXT SESSION: Improve L hip strength, L hip ER/IR mobility, hamstring flexibility.   Delphia Grates. Fairly IV, PT, DPT Physical Therapist- [place]  [facility]  08/30/2023, 8:15 AM

## 2023-08-31 ENCOUNTER — Telehealth: Payer: Self-pay

## 2023-08-31 ENCOUNTER — Ambulatory Visit: Payer: No Typology Code available for payment source

## 2023-08-31 NOTE — Telephone Encounter (Signed)
 Called patient about missed PT appointment. No answer but LVM about next following appointment.

## 2023-09-06 ENCOUNTER — Telehealth: Payer: Self-pay

## 2023-09-06 ENCOUNTER — Ambulatory Visit: Payer: No Typology Code available for payment source

## 2023-09-06 NOTE — Telephone Encounter (Signed)
 Called patient about missed PT appointment. No answer but LVM Informing patient about the no show policy and to please call for future scheduling concerns as needed for cancellations or rescheduling.

## 2023-09-08 ENCOUNTER — Ambulatory Visit: Payer: No Typology Code available for payment source

## 2023-09-12 ENCOUNTER — Telehealth: Payer: Self-pay

## 2023-09-12 ENCOUNTER — Ambulatory Visit: Payer: No Typology Code available for payment source

## 2023-09-12 NOTE — Telephone Encounter (Signed)
 Called patient about missed appointment. Patient mentioned that he had work this morning. He is agreeable to remaining appointments that are scheduled in the evening. Discussed no show policy and to call for future rescheduling or concerns.

## 2023-09-14 ENCOUNTER — Telehealth: Payer: Self-pay

## 2023-09-14 ENCOUNTER — Ambulatory Visit: Payer: No Typology Code available for payment source | Attending: Family Medicine

## 2023-09-14 NOTE — Telephone Encounter (Signed)
 Called patient about missed appointment. No answer but LVM regarding no show policy and to call for future rescheduling or concerns.

## 2023-09-20 ENCOUNTER — Ambulatory Visit: Payer: No Typology Code available for payment source

## 2023-09-22 ENCOUNTER — Ambulatory Visit: Payer: No Typology Code available for payment source

## 2023-10-04 ENCOUNTER — Ambulatory Visit: Payer: No Typology Code available for payment source | Admitting: Family Medicine

## 2023-11-11 ENCOUNTER — Encounter: Payer: Self-pay | Admitting: Family Medicine

## 2023-11-11 ENCOUNTER — Ambulatory Visit: Admitting: Family Medicine

## 2023-11-11 VITALS — BP 145/93 | HR 61 | Ht 69.0 in | Wt 182.2 lb

## 2023-11-11 DIAGNOSIS — L739 Follicular disorder, unspecified: Secondary | ICD-10-CM | POA: Diagnosis not present

## 2023-11-11 DIAGNOSIS — M4726 Other spondylosis with radiculopathy, lumbar region: Secondary | ICD-10-CM | POA: Diagnosis not present

## 2023-11-11 MED ORDER — MUPIROCIN 2 % EX OINT: 1.0000 | TOPICAL_OINTMENT | Freq: Two times a day (BID) | CUTANEOUS | 0 refills | Status: DC

## 2023-11-11 MED ORDER — ONDANSETRON 4 MG PO TBDP: ORAL_TABLET | ORAL | 6 refills | Status: AC

## 2023-11-11 NOTE — Assessment & Plan Note (Signed)
 Would like to see PM&R. Declines MRI or more PT. Referral placed today. Call with any concerns.

## 2023-11-11 NOTE — Progress Notes (Signed)
 BP (!) 145/93 (BP Location: Left Arm, Patient Position: Sitting, Cuff Size: Large)   Pulse 61   Ht 5\' 9"  (1.753 m)   Wt 182 lb 3.2 oz (82.6 kg)   SpO2 97%   BMI 26.91 kg/m    Subjective:    Patient ID: Jacob Key, male    DOB: September 10, 1980, 43 y.o.   MRN: [id number]  HPI: Jacob Key is a 43 y.o. male  Chief Complaint  Patient presents with   Back Pain   BACK PAIN- was not able to do PT due to cost and timing with work Duration: chronic Mechanism of injury: unknown Location: R>L in low back Onset: gradual Severity: moderate Quality: shooting and aching Frequency: intermittent Radiation: L leg leg down to his ankle Aggravating factors: laying in bed, lifting, moving around Alleviating factors: stretching Status: worse Treatments attempted: meloxicam , icy hot, stretches, small amount of PT  Relief with NSAIDs?: mild Nighttime pain:  no Paresthesias / decreased sensation:  yes Bowel / bladder incontinence:  no Fevers:  no Dysuria / urinary frequency:  no  SKIN INFECTION Duration: days Location: R chest History of trauma in area: yes Pain: no Quality: no pain Severity: none Redness: yes Swelling: no Oozing: no Pus: no Fevers: no Nausea/vomiting: no Status: worse Treatments attempted:none  Tetanus: UTD   Relevant past medical, surgical, family and social history reviewed and updated as indicated. Interim medical history since our last visit reviewed. Allergies and medications reviewed and updated.  Review of Systems  Constitutional: Negative.   Respiratory: Negative.    Cardiovascular: Negative.   Musculoskeletal:  Positive for back pain and myalgias. Negative for arthralgias, gait problem, joint swelling, neck pain and neck stiffness.  Skin: Negative.   Neurological:  Positive for numbness. Negative for dizziness, tremors, seizures, syncope, facial asymmetry, speech difficulty, weakness, light-headedness and headaches.  Psychiatric/Behavioral:  Negative.      Per HPI unless specifically indicated above     Objective:     BP (!) 145/93 (BP Location: Left Arm, Patient Position: Sitting, Cuff Size: Large)   Pulse 61   Ht 5\' 9"  (1.753 m)   Wt 182 lb 3.2 oz (82.6 kg)   SpO2 97%   BMI 26.91 kg/m   Wt Readings from Last 3 Encounters:  11/11/23 182 lb 3.2 oz (82.6 kg)  08/01/23 184 lb 3.2 oz (83.6 kg)  01/28/23 180 lb 9.6 oz (81.9 kg)    Physical Exam Vitals and nursing note reviewed.  Constitutional:      General: He is not in acute distress.    Appearance: Normal appearance. He is not ill-appearing, toxic-appearing or diaphoretic.  HENT:     Head: Normocephalic and atraumatic.     Right Ear: External ear normal.     Left Ear: External ear normal.     Nose: Nose normal.     Mouth/Throat:     Mouth: Mucous membranes are moist.     Pharynx: Oropharynx is clear.  Eyes:     General: No scleral icterus.       Right eye: No discharge.        Left eye: No discharge.     Extraocular Movements: Extraocular movements intact.     Conjunctiva/sclera: Conjunctivae normal.     Pupils: Pupils are equal, round, and reactive to light.  Cardiovascular:     Rate and Rhythm: Normal rate and regular rhythm.     Pulses: Normal pulses.     Heart sounds: Normal heart sounds. No murmur  heard.    No friction rub. No gallop.  Pulmonary:     Effort: Pulmonary effort is normal. No respiratory distress.     Breath sounds: Normal breath sounds. No stridor. No wheezing, rhonchi or rales.  Chest:     Chest wall: No tenderness.  Musculoskeletal:        General: Normal range of motion.     Cervical back: Normal range of motion and neck supple.  Skin:    General: Skin is warm and dry.     Capillary Refill: Capillary refill takes less than 2 seconds.     Coloration: Skin is not jaundiced or pale.     Findings: Erythema (R chest lateral and below nipple) present. No bruising, lesion or rash.  Neurological:     General: No focal deficit  present.     Mental Status: He is alert and oriented to person, place, and time. Mental status is at baseline.  Psychiatric:        Mood and Affect: Mood normal.        Behavior: Behavior normal.        Thought Content: Thought content normal.        Judgment: Judgment normal.     Results for orders placed or performed in visit on 08/01/23  Comprehensive metabolic panel   Collection Time: 08/01/23  1:32 PM  Result Value Ref Range   Glucose 79 70 - 99 mg/dL   BUN 19 6 - 24 mg/dL   Creatinine, Ser 1.61 0.76 - 1.27 mg/dL   eGFR 76 >09 UE/AVW/0.98   BUN/Creatinine Ratio 16 9 - 20   Sodium 143 134 - 144 mmol/L   Potassium 4.5 3.5 - 5.2 mmol/L   Chloride 99 96 - 106 mmol/L   CO2 31 (H) 20 - 29 mmol/L   Calcium  9.7 8.7 - 10.2 mg/dL   Total Protein 7.4 6.0 - 8.5 g/dL   Albumin 4.8 4.1 - 5.1 g/dL   Globulin, Total 2.6 1.5 - 4.5 g/dL   Bilirubin Total 0.4 0.0 - 1.2 mg/dL   Alkaline Phosphatase 66 44 - 121 IU/L   AST 27 0 - 40 IU/L   ALT 25 0 - 44 IU/L  Lipid Panel w/o Chol/HDL Ratio   Collection Time: 08/01/23  1:32 PM  Result Value Ref Range   Cholesterol, Total 196 100 - 199 mg/dL   Triglycerides 119 0 - 149 mg/dL   HDL 62 >14 mg/dL   VLDL Cholesterol Cal 23 5 - 40 mg/dL   LDL Chol Calc (NIH) 782 (H) 0 - 99 mg/dL      Assessment & Plan:   Problem List Items Addressed This Visit       Nervous and Auditory   Osteoarthritis of spine with radiculopathy, lumbar region - Primary   Would like to see PM&R. Declines MRI or more PT. Referral placed today. Call with any concerns.      Relevant Orders   Ambulatory referral to Physical Medicine Rehab   Other Visit Diagnoses       Folliculitis       WIll treat with mupiriocin. Call if not getting better or getting worse.        Follow up plan: Return in about 2 months (around 01/23/2024).

## 2023-12-05 ENCOUNTER — Other Ambulatory Visit: Payer: Self-pay | Admitting: Family Medicine

## 2023-12-05 DIAGNOSIS — M5416 Radiculopathy, lumbar region: Secondary | ICD-10-CM

## 2023-12-06 ENCOUNTER — Other Ambulatory Visit: Payer: Self-pay | Admitting: Family Medicine

## 2023-12-07 ENCOUNTER — Other Ambulatory Visit

## 2023-12-13 ENCOUNTER — Other Ambulatory Visit

## 2024-01-06 ENCOUNTER — Ambulatory Visit
Admission: RE | Admit: 2024-01-06 | Discharge: 2024-01-06 | Disposition: A | Discharge status: Home / Self Care | Source: Ambulatory Visit | Attending: Family Medicine | Admitting: Family Medicine

## 2024-01-06 DIAGNOSIS — M5416 Radiculopathy, lumbar region: Secondary | ICD-10-CM

## 2024-01-24 ENCOUNTER — Ambulatory Visit: Admitting: Family Medicine

## 2024-01-30 ENCOUNTER — Ambulatory Visit: Admitting: Family Medicine

## 2024-01-30 ENCOUNTER — Encounter: Payer: Self-pay | Admitting: Family Medicine

## 2024-01-30 VITALS — BP 154/92 | HR 57 | Temp 97.9°F | Ht 69.0 in | Wt 190.0 lb

## 2024-01-30 DIAGNOSIS — I10 Essential (primary) hypertension: Secondary | ICD-10-CM | POA: Diagnosis not present

## 2024-01-30 DIAGNOSIS — Z0000 Encounter for general adult medical examination without abnormal findings: Secondary | ICD-10-CM | POA: Diagnosis not present

## 2024-01-30 DIAGNOSIS — F3342 Major depressive disorder, recurrent, in full remission: Secondary | ICD-10-CM | POA: Diagnosis not present

## 2024-01-30 DIAGNOSIS — E782 Mixed hyperlipidemia: Secondary | ICD-10-CM

## 2024-01-30 LAB — MICROALBUMIN, URINE WAIVED
Creatinine, Urine Waived: 50 mg/dL (ref 10–300)
Microalb, Ur Waived: 30 mg/L — ABNORMAL HIGH (ref 0–19)

## 2024-01-30 MED ORDER — LOSARTAN POTASSIUM 25 MG PO TABS: 25.0000 mg | ORAL_TABLET | Freq: Every day | ORAL | 0 refills | Status: DC

## 2024-01-30 MED ORDER — PAROXETINE HCL 40 MG PO TABS: 40.0000 mg | ORAL_TABLET | Freq: Every day | ORAL | 1 refills | Status: AC

## 2024-01-30 MED ORDER — ROSUVASTATIN CALCIUM 20 MG PO TABS: 20.0000 mg | ORAL_TABLET | Freq: Every day | ORAL | 1 refills | Status: DC

## 2024-01-30 MED ORDER — MELOXICAM 15 MG PO TABS: 15.0000 mg | ORAL_TABLET | Freq: Every day | ORAL | 1 refills | Status: DC

## 2024-01-30 NOTE — Assessment & Plan Note (Signed)
 Under good control on current regimen. Continue current regimen. Continue to monitor. Call with any concerns. Refills given. Labs drawn today.

## 2024-01-30 NOTE — Progress Notes (Signed)
 BP (!) 154/92   Pulse (!) 57   Temp 97.9 F (36.6 C) (Oral)   Ht 5' 9 (1.753 m)   Wt 190 lb (86.2 kg)   SpO2 97%   BMI 28.06 kg/m    Subjective:    Patient ID: Jacob Key, male    DOB: [birth year], 43 y.o.   MRN: [id number]  HPI: Jacob Key is a 43 y.o. male presenting on 01/30/2024 for comprehensive medical examination. Current medical complaints include:  HYPERTENSION / HYPERLIPIDEMIA Satisfied with current treatment? no Duration of hypertension: months BP monitoring frequency: not checking BP medication side effects: N/A Past BP meds: none Duration of hyperlipidemia: chronic Cholesterol medication side effects: no Cholesterol supplements: none Past cholesterol medications: crestor  Medication compliance: excellent compliance Aspirin: no Recent stressors: no Recurrent headaches: no Visual changes: no Palpitations: no Dyspnea: no Chest pain: no Lower extremity edema: no Dizzy/lightheaded: no  DEPRESSION Mood status: controlled Satisfied with current treatment?: yes Symptom severity: mild  Duration of current treatment : chronic Side effects: no Medication compliance: excellent compliance Psychotherapy/counseling: no  Previous psychiatric medications: paxil  Depressed mood: no Anxious mood: no Anhedonia: no Significant weight loss or gain: no Insomnia: no  Fatigue: no Feelings of worthlessness or guilt: no Impaired concentration/indecisiveness: no Suicidal ideations: no Hopelessness: no Crying spells: no    01/30/2024    9:45 AM 11/11/2023    3:44 PM 08/01/2023    1:19 PM 08/01/2023    1:15 PM 01/28/2023    1:07 PM  Depression screen PHQ 2/9  Decreased Interest 0 0 0 0 0  Down, Depressed, Hopeless 0 0 0 0 0  PHQ - 2 Score 0 0 0 0 0  Altered sleeping 0 0 0 0 1  Tired, decreased energy 0 0 0 0 0  Change in appetite 0 0 0 0 0  Feeling bad or failure about yourself  0 0 0  0  Trouble concentrating 0 0 0 0 0  Moving slowly or fidgety/restless 0  0 0 0 0  Suicidal thoughts 0 0 0 0 0  PHQ-9 Score 0 0 0 0 1  Difficult doing work/chores   Not difficult at all  Somewhat difficult   Interim Problems from his last visit: no  Depression Screen done today and results listed below:     01/30/2024    9:45 AM 11/11/2023    3:44 PM 08/01/2023    1:19 PM 08/01/2023    1:15 PM 01/28/2023    1:07 PM  Depression screen PHQ 2/9  Decreased Interest 0 0 0 0 0  Down, Depressed, Hopeless 0 0 0 0 0  PHQ - 2 Score 0 0 0 0 0  Altered sleeping 0 0 0 0 1  Tired, decreased energy 0 0 0 0 0  Change in appetite 0 0 0 0 0  Feeling bad or failure about yourself  0 0 0  0  Trouble concentrating 0 0 0 0 0  Moving slowly or fidgety/restless 0 0 0 0 0  Suicidal thoughts 0 0 0 0 0  PHQ-9 Score 0 0 0 0 1  Difficult doing work/chores   Not difficult at all  Somewhat difficult    Past Medical History:  Past Medical History:  Diagnosis Date   Depression     Surgical History:  Past Surgical History:  Procedure Laterality Date   FACIAL RECONSTRUCTION SURGERY     Lower face   TONSILLECTOMY      Medications:  Current Outpatient Medications  on File Prior to Visit  Medication Sig   mupirocin  ointment (BACTROBAN ) 2 % Apply 1 Application topically 2 (two) times daily.   ondansetron  (ZOFRAN -ODT) 4 MG disintegrating tablet DISSOLVE 1 TABLET IN MOUTH EVERY 8 HOURS AS NEEDED FOR NAUSEA AND VOMITING   No current facility-administered medications on file prior to visit.    Allergies:  No Known Allergies  Social History:  Social History   Socioeconomic History   Marital status: Single    Spouse name: Not on file   Number of children: Not on file   Years of education: Not on file   Highest education level: Not on file  Occupational History   Not on file  Tobacco Use   Smoking status: Former    Current packs/day: 0.50    Average packs/day: 0.5 packs/day for 9.0 years (4.5 ttl pk-yrs)    Types: Cigarettes, E-cigarettes    Start date: 01/18/2015    Smokeless tobacco: Never  Vaping Use   Vaping status: Every Day  Substance and Sexual Activity   Alcohol use: Yes    Alcohol/week: 20.0 standard drinks of alcohol    Types: 20 Cans of beer per week    Comment: moderate   Drug use: No   Sexual activity: Yes  Other Topics Concern   Not on file  Social History Narrative   Not on file   Social Drivers of Health   Financial Resource Strain: Low Risk  (12/02/2023)   Received from [facility]   Overall Financial Resource Strain (CARDIA)    Difficulty of Paying Living Expenses: Not hard at all  Food Insecurity: No Food Insecurity (12/02/2023)   Received from [facility]   Hunger Vital Sign    Within the past 12 months, you worried that your food would run out before you got the money to buy more.: Never true    Within the past 12 months, the food you bought just didn't last and you didn't have money to get more.: Never true  Transportation Needs: No Transportation Needs (12/02/2023)   Received from [facility] - Transportation    In the past 12 months, has lack of transportation kept you from medical appointments or from getting medications?: No    Lack of Transportation (Non-Medical): No  Physical Activity: Not on file  Stress: Not on file  Social Connections: Unknown (10/27/2021)   Received from [facility]   Social Network    Social Network: Not on file  Intimate Partner Violence: Unknown (09/18/2021)   Received from [facility]   HITS    Physically Hurt: Not on file    Insult or Talk Down To: Not on file    Threaten Physical Harm: Not on file    Scream or Curse: Not on file   Social History   Tobacco Use  Smoking Status Former   Current packs/day: 0.50   Average packs/day: 0.5 packs/day for 9.0 years (4.5 ttl pk-yrs)   Types: Cigarettes, E-cigarettes   Start date: 01/18/2015  Smokeless Tobacco Never   Social History   Substance and Sexual Activity   Alcohol Use Yes   Alcohol/week: 20.0 standard drinks of alcohol   Types: 20 Cans of beer per week   Comment: moderate    Family History:  Family History  Problem Relation Age of Onset   Hyperlipidemia Mother    Hyperlipidemia Father    Alcohol abuse Maternal Grandmother    Heart disease Maternal Grandmother  Heart disease Maternal Grandfather    Alzheimer's disease Paternal Grandfather     Past medical history, surgical history, medications, allergies, family history and social history reviewed with patient today and changes made to appropriate areas of the chart.   Review of Systems  Constitutional: Negative.   HENT: Negative.    Eyes: Negative.   Respiratory: Negative.    Cardiovascular: Negative.   Gastrointestinal:  Positive for heartburn and nausea. Negative for abdominal pain, blood in stool, constipation, diarrhea, melena and vomiting.  Genitourinary: Negative.   Musculoskeletal: Negative.   Skin: Negative.   Neurological:  Positive for headaches. Negative for dizziness, tingling, tremors, sensory change, speech change, focal weakness, seizures, loss of consciousness and weakness.  Endo/Heme/Allergies: Negative.   Psychiatric/Behavioral: Negative.     All other ROS negative except what is listed above and in the HPI.      Objective:    BP (!) 154/92   Pulse (!) 57   Temp 97.9 F (36.6 C) (Oral)   Ht 5' 9 (1.753 m)   Wt 190 lb (86.2 kg)   SpO2 97%   BMI 28.06 kg/m   Wt Readings from Last 3 Encounters:  01/30/24 190 lb (86.2 kg)  11/11/23 182 lb 3.2 oz (82.6 kg)  08/01/23 184 lb 3.2 oz (83.6 kg)    Physical Exam Vitals and nursing note reviewed.  Constitutional:      General: He is not in acute distress.    Appearance: Normal appearance. He is not ill-appearing, toxic-appearing or diaphoretic.  HENT:     Head: Normocephalic and atraumatic.     Right Ear: Tympanic membrane, ear canal and external ear normal. There is no impacted cerumen.     Left  Ear: Tympanic membrane, ear canal and external ear normal. There is no impacted cerumen.     Nose: Nose normal. No congestion or rhinorrhea.     Mouth/Throat:     Mouth: Mucous membranes are moist.     Pharynx: Oropharynx is clear. No oropharyngeal exudate or posterior oropharyngeal erythema.  Eyes:     General: No scleral icterus.       Right eye: No discharge.        Left eye: No discharge.     Extraocular Movements: Extraocular movements intact.     Conjunctiva/sclera: Conjunctivae normal.     Pupils: Pupils are equal, round, and reactive to light.  Neck:     Vascular: No carotid bruit.  Cardiovascular:     Rate and Rhythm: Normal rate and regular rhythm.     Pulses: Normal pulses.     Heart sounds: No murmur heard.    No friction rub. No gallop.  Pulmonary:     Effort: Pulmonary effort is normal. No respiratory distress.     Breath sounds: Normal breath sounds. No stridor. No wheezing, rhonchi or rales.  Chest:     Chest wall: No tenderness.  Abdominal:     General: Abdomen is flat. Bowel sounds are normal. There is no distension.     Palpations: Abdomen is soft. There is no mass.     Tenderness: There is no abdominal tenderness. There is no right CVA tenderness, left CVA tenderness, guarding or rebound.     Hernia: No hernia is present.  Genitourinary:    Comments: Genital exam deferred with shared decision making Musculoskeletal:        General: No swelling, tenderness, deformity or signs of injury.     Cervical back: Normal range of motion and  neck supple. No rigidity. No muscular tenderness.     Right lower leg: No edema.     Left lower leg: No edema.  Lymphadenopathy:     Cervical: No cervical adenopathy.  Skin:    General: Skin is warm and dry.     Capillary Refill: Capillary refill takes less than 2 seconds.     Coloration: Skin is not jaundiced or pale.     Findings: No bruising, erythema, lesion or rash.  Neurological:     General: No focal deficit present.      Mental Status: He is alert and oriented to person, place, and time.     Cranial Nerves: No cranial nerve deficit.     Sensory: No sensory deficit.     Motor: No weakness.     Coordination: Coordination normal.     Gait: Gait normal.     Deep Tendon Reflexes: Reflexes normal.  Psychiatric:        Mood and Affect: Mood normal.        Behavior: Behavior normal.        Thought Content: Thought content normal.        Judgment: Judgment normal.     Results for orders placed or performed in visit on 01/30/24  Microalbumin, Urine Waived   Collection Time: 01/30/24 10:13 AM  Result Value Ref Range   Microalb, Ur Waived 30 (H) 0 - 19 mg/L   Creatinine, Urine Waived 50 10 - 300 mg/dL   Microalb/Creat Ratio 30-300 (H) <30 mg/g      Assessment & Plan:   Problem List Items Addressed This Visit       Cardiovascular and Mediastinum   Primary hypertension   Newly diagnosed. Will start him on losartan  and recheck in 4-6 weeks. Call with any concerns.       Relevant Medications   rosuvastatin  (CRESTOR ) 20 MG tablet   losartan  (COZAAR ) 25 MG tablet   Other Relevant Orders   Microalbumin, Urine Waived (Completed)     Other   Depression   Under good control on current regimen. Continue current regimen. Continue to monitor. Call with any concerns. Refills given. Labs drawn today.        Relevant Medications   PARoxetine  (PAXIL ) 40 MG tablet   Hyperlipidemia   Under good control on current regimen. Continue current regimen. Continue to monitor. Call with any concerns. Refills given. Labs drawn today.       Relevant Medications   rosuvastatin  (CRESTOR ) 20 MG tablet   losartan  (COZAAR ) 25 MG tablet   Other Visit Diagnoses       Routine general medical examination at a health care facility    -  Primary   Vaccines up to date/declined. Screening labs checked today. Continue diet and exercise. Call with any concerns.   Relevant Orders   Comprehensive metabolic panel with GFR   CBC  with Differential/Platelet   Lipid Panel w/o Chol/HDL Ratio   PSA   TSH   Microalbumin, Urine Waived (Completed)   Hepatitis B surface antibody,quantitative       LABORATORY TESTING:  Health maintenance labs ordered today as discussed above.   The natural history of prostate cancer and ongoing controversy regarding screening and potential treatment outcomes of prostate cancer has been discussed with the patient. The meaning of a false positive PSA and a false negative PSA has been discussed. He indicates understanding of the limitations of this screening test and wishes to proceed with screening PSA testing.  IMMUNIZATIONS:   - Tdap: Tetanus vaccination status reviewed: last tetanus booster within 10 years. - Influenza: Refused - Pneumovax: Refused - Prevnar: Refused - COVID: Refused - HPV: Refused  PATIENT COUNSELING:    Sexuality: Discussed sexually transmitted diseases, partner selection, use of condoms, avoidance of unintended pregnancy  and contraceptive alternatives.   Advised to avoid cigarette smoking.  I discussed with the patient that most people either abstain from alcohol or drink within safe limits (<=14/week and <=4 drinks/occasion for males, <=7/weeks and <= 3 drinks/occasion for females) and that the risk for alcohol disorders and other health effects rises proportionally with the number of drinks per week and how often a drinker exceeds daily limits.  Discussed cessation/primary prevention of drug use and availability of treatment for abuse.   Diet: Encouraged to adjust caloric intake to maintain  or achieve ideal body weight, to reduce intake of dietary saturated fat and total fat, to limit sodium intake by avoiding high sodium foods and not adding table salt, and to maintain adequate dietary potassium and calcium  preferably from fresh fruits, vegetables, and low-fat dairy products.    stressed the importance of regular exercise  Injury prevention: Discussed  safety belts, safety helmets, smoke detector, smoking near bedding or upholstery.   Dental health: Discussed importance of regular tooth brushing, flossing, and dental visits.   Follow up plan: NEXT PREVENTATIVE PHYSICAL DUE IN 1 YEAR. Return in about 6 weeks (around 03/12/2024).

## 2024-01-30 NOTE — Assessment & Plan Note (Signed)
 Newly diagnosed. Will start him on losartan  and recheck in 4-6 weeks. Call with any concerns.

## 2024-01-31 ENCOUNTER — Other Ambulatory Visit: Payer: Self-pay | Admitting: Internal Medicine

## 2024-01-31 ENCOUNTER — Ambulatory Visit: Payer: Self-pay | Admitting: Family Medicine

## 2024-01-31 DIAGNOSIS — E782 Mixed hyperlipidemia: Secondary | ICD-10-CM

## 2024-01-31 LAB — CBC WITH DIFFERENTIAL/PLATELET
Basophils Absolute: 0 x10E3/uL (ref 0.0–0.2)
Basos: 1 %
EOS (ABSOLUTE): 0.2 x10E3/uL (ref 0.0–0.4)
Eos: 5 %
Hematocrit: 45.2 % (ref 37.5–51.0)
Hemoglobin: 14.8 g/dL (ref 13.0–17.7)
Immature Grans (Abs): 0 x10E3/uL (ref 0.0–0.1)
Immature Granulocytes: 0 %
Lymphocytes Absolute: 1.2 x10E3/uL (ref 0.7–3.1)
Lymphs: 29 %
MCH: 31 pg (ref 26.6–33.0)
MCHC: 32.7 g/dL (ref 31.5–35.7)
MCV: 95 fL (ref 79–97)
Monocytes Absolute: 0.4 x10E3/uL (ref 0.1–0.9)
Monocytes: 9 %
Neutrophils Absolute: 2.4 x10E3/uL (ref 1.4–7.0)
Neutrophils: 55 %
Platelets: 243 x10E3/uL (ref 150–450)
RBC: 4.77 x10E6/uL (ref 4.14–5.80)
RDW: 12.2 % (ref 11.6–15.4)
WBC: 4.3 x10E3/uL (ref 3.4–10.8)

## 2024-01-31 LAB — COMPREHENSIVE METABOLIC PANEL WITH GFR
ALT: 38 IU/L (ref 0–44)
AST: 35 IU/L (ref 0–40)
Albumin: 4.5 g/dL (ref 4.1–5.1)
Alkaline Phosphatase: 66 IU/L (ref 44–121)
BUN/Creatinine Ratio: 19 (ref 9–20)
BUN: 17 mg/dL (ref 6–24)
Bilirubin Total: 0.5 mg/dL (ref 0.0–1.2)
CO2: 25 mmol/L (ref 20–29)
Calcium: 9.5 mg/dL (ref 8.7–10.2)
Chloride: 101 mmol/L (ref 96–106)
Creatinine, Ser: 0.91 mg/dL (ref 0.76–1.27)
Globulin, Total: 2.5 g/dL (ref 1.5–4.5)
Glucose: 96 mg/dL (ref 70–99)
Potassium: 4.4 mmol/L (ref 3.5–5.2)
Sodium: 139 mmol/L (ref 134–144)
Total Protein: 7 g/dL (ref 6.0–8.5)
eGFR: 107 mL/min/1.73 (ref >59)

## 2024-01-31 LAB — LIPID PANEL W/O CHOL/HDL RATIO
Cholesterol, Total: 213 mg/dL — ABNORMAL HIGH (ref 100–199)
HDL: 59 mg/dL (ref >39)
LDL Chol Calc (NIH): 137 mg/dL — ABNORMAL HIGH (ref 0–99)
Triglycerides: 96 mg/dL (ref 0–149)
VLDL Cholesterol Cal: 17 mg/dL (ref 5–40)

## 2024-01-31 LAB — TSH: TSH: 2.34 u[IU]/mL (ref 0.450–4.500)

## 2024-01-31 LAB — HEPATITIS B SURFACE ANTIBODY, QUANTITATIVE: Hepatitis B Surf Ab Quant: 1897 m[IU]/mL

## 2024-01-31 LAB — PSA: Prostate Specific Ag, Serum: 0.7 ng/mL (ref 0.0–4.0)

## 2024-01-31 MED ORDER — ROSUVASTATIN CALCIUM 40 MG PO TABS: 40.0000 mg | ORAL_TABLET | Freq: Every day | ORAL | 1 refills | Status: AC

## 2024-01-31 NOTE — Telephone Encounter (Signed)
 Copied from CRM [id number]. Topic: Clinical - Medication Refill >> Jan 31, 2024 12:11 PM Emylou G wrote: Medication: losartan  (COZAAR ) 25 MG tablet  Has the patient contacted their pharmacy? No (Agent: If no, request that the patient contact the pharmacy for the refill. If patient does not wish to contact the pharmacy document the reason why and proceed with request.) (Agent: If yes, when and what did the pharmacy advise?)  This is the patient's preferred pharmacy:  [facility] [address] [place] - [address] [place] [address] Phone: [phone] Fax: [phone]  Is this the correct pharmacy for this prescription? Yes If no, delete pharmacy and type the correct one.   Has the prescription been filled recently? No  Is the patient out of the medication? Yes  Has the patient been seen for an appointment in the last year OR does the patient have an upcoming appointment? Yes  Can we respond through MyChart? Yes  Agent: Please be advised that Rx refills may take up to 3 business days. We ask that you follow-up with your pharmacy.

## 2024-02-02 NOTE — Telephone Encounter (Signed)
 Duplicate request, LRF 01/30/24 for 90 days.E-Prescribing Status: Receipt confirmed by pharmacy (01/30/2024 10:04 AM EDT). Requested Prescriptions  Pending Prescriptions Disp Refills   losartan  (COZAAR ) 25 MG tablet 90 tablet 0    Sig: Take 1 tablet (25 mg total) by mouth daily.     Cardiovascular:  Angiotensin Receptor Blockers Failed - 02/02/2024  8:12 AM      Failed - Last BP in normal range    BP Readings from Last 1 Encounters:  01/30/24 (!) 154/92         Passed - Cr in normal range and within 180 days    Creatinine  Date Value Ref Range Status  09/15/2013 1.00 0.60 - 1.30 mg/dL Final   Creatinine, Ser  Date Value Ref Range Status  01/30/2024 0.91 0.76 - 1.27 mg/dL Final         Passed - K in normal range and within 180 days    Potassium  Date Value Ref Range Status  01/30/2024 4.4 3.5 - 5.2 mmol/L Final  09/15/2013 4.0 3.5 - 5.1 mmol/L Final         Passed - Patient is not pregnant      Passed - Valid encounter within last 6 months    Recent Outpatient Visits           3 days ago Routine general medical examination at a health care facility   [facility], Megan P, DO   2 months ago Osteoarthritis of spine with radiculopathy, lumbar region   [facility], Megan P, DO   6 months ago Mixed hyperlipidemia   [facility], [facility], DO

## 2024-02-20 ENCOUNTER — Ambulatory Visit: Admission: EM | Admit: 2024-02-20 | Discharge: 2024-02-20 | Disposition: A | Discharge status: Home / Self Care

## 2024-02-20 ENCOUNTER — Encounter: Payer: Self-pay | Admitting: Emergency Medicine

## 2024-02-20 ENCOUNTER — Ambulatory Visit: Payer: Self-pay

## 2024-02-20 DIAGNOSIS — R0789 Other chest pain: Secondary | ICD-10-CM

## 2024-02-20 HISTORY — DX: Essential (primary) hypertension: I10

## 2024-02-20 MED ORDER — CYCLOBENZAPRINE HCL 10 MG PO TABS: 10.0000 mg | ORAL_TABLET | Freq: Two times a day (BID) | ORAL | 0 refills | Status: AC | PRN

## 2024-02-20 NOTE — Discharge Instructions (Addendum)
  1. Acute chest wall pain (Primary) - ED EKG performed in UC shows sinus bradycardia, no STEMI, normal EKG, ventricular rate of 53 bpm. - cyclobenzaprine  (FLEXERIL ) 10 MG tablet; Take 1 tablet (10 mg total) by mouth 2 (two) times daily as needed for muscle spasms.  Dispense: 20 tablet; Refill: 0 - Continue take daily meloxicam  with cyclobenzaprine  to help relax muscle and help with inflammation to chest wall. -Continue to monitor symptoms for any change in severity if there is any escalation of current symptoms or development of new symptoms follow-up in ER for further evaluation and management.

## 2024-02-20 NOTE — ED Triage Notes (Signed)
 Patient reports left sided chest pain that radiates to left side of back and left arm. Patient states he lifts heavy objects at work. Rates pain 5/10. Patient states he took some meloxicam  with no relief.

## 2024-02-20 NOTE — ED Provider Notes (Signed)
 [facility]  Note:  This document was prepared using Conservation officer, historic buildings and may include unintentional dictation errors.  MRN: [id number] DOB: 10-30-1980  Subjective:   Jacob Key is a 43 y.o. male presenting for left-sided chest wall pain with radiation to the left axilla and left back.  Patient reports that symptoms have been persistent for about 1 week.  Patient states he lifts heavy objects at work and may have injured himself during work activities.  Patient reports pain is 5/10.  Patient takes daily meloxicam  and states that symptoms have not improved with meloxicam  therapy.  Patient has past history of hypertension, hyperlipidemia, cigarette smoker.  Denies any known sick exposures.  Denies any past history of cardiac dysfunction.  No shortness of breath, weakness, dizziness  No current facility-administered medications for this encounter.  Current Outpatient Medications:    cyclobenzaprine  (FLEXERIL ) 10 MG tablet, Take 1 tablet (10 mg total) by mouth 2 (two) times daily as needed for muscle spasms., Disp: 20 tablet, Rfl: 0   losartan  (COZAAR ) 25 MG tablet, Take 1 tablet (25 mg total) by mouth daily., Disp: 90 tablet, Rfl: 0   meloxicam  (MOBIC ) 15 MG tablet, Take 1 tablet (15 mg total) by mouth daily., Disp: 90 tablet, Rfl: 1   mupirocin  ointment (BACTROBAN ) 2 %, Apply 1 Application topically 2 (two) times daily., Disp: 22 g, Rfl: 0   ondansetron  (ZOFRAN -ODT) 4 MG disintegrating tablet, DISSOLVE 1 TABLET IN MOUTH EVERY 8 HOURS AS NEEDED FOR NAUSEA AND VOMITING, Disp: 20 tablet, Rfl: 6   PARoxetine  (PAXIL ) 40 MG tablet, Take 1 tablet (40 mg total) by mouth daily., Disp: 90 tablet, Rfl: 1   rosuvastatin  (CRESTOR ) 40 MG tablet, Take 1 tablet (40 mg total) by mouth daily., Disp: 90 tablet, Rfl: 1   No Known Allergies  Past Medical History:  Diagnosis Date   Depression    Hypertension      Past Surgical History:  Procedure Laterality Date   FACIAL  RECONSTRUCTION SURGERY     Lower face   TONSILLECTOMY      Family History  Problem Relation Age of Onset   Hyperlipidemia Mother    Hyperlipidemia Father    Alcohol abuse Maternal Grandmother    Heart disease Maternal Grandmother    Heart disease Maternal Grandfather    Alzheimer's disease Paternal Grandfather     Social History   Tobacco Use   Smoking status: Former    Current packs/day: 0.50    Average packs/day: 0.5 packs/day for 9.1 years (4.5 ttl pk-yrs)    Types: Cigarettes, E-cigarettes    Start date: 01/18/2015   Smokeless tobacco: Never  Vaping Use   Vaping status: Every Day  Substance Use Topics   Alcohol use: Yes    Alcohol/week: 20.0 standard drinks of alcohol    Types: 20 Cans of beer per week    Comment: moderate   Drug use: No    ROS Refer to HPI for ROS details.  Objective:   Vitals: BP (!) 142/76 (BP Location: Right Arm)   Pulse 60   Temp 97.7 F (36.5 C) (Oral)   Resp 20   SpO2 95%   Physical Exam Vitals and nursing note reviewed.  Constitutional:      General: He is not in acute distress.    Appearance: Normal appearance. He is well-developed. He is not ill-appearing or toxic-appearing.  HENT:     Head: Normocephalic.  Cardiovascular:     Rate and Rhythm: Normal rate and  regular rhythm.     Heart sounds: Normal heart sounds. No murmur heard. Pulmonary:     Effort: Pulmonary effort is normal. No respiratory distress.     Breath sounds: Normal breath sounds. No stridor. No wheezing, rhonchi or rales.  Chest:     Chest wall: Tenderness (Chest wall tenderness with palpation to the left axilla/upper ribs.) present.  Musculoskeletal:     Cervical back: Normal range of motion and neck supple. Tenderness present. No rigidity.  Lymphadenopathy:     Cervical: No cervical adenopathy.  Skin:    General: Skin is warm and dry.  Neurological:     General: No focal deficit present.     Mental Status: He is alert and oriented to person, place, and  time.  Psychiatric:        Mood and Affect: Mood normal.        Behavior: Behavior normal.     Procedures  No results found for this or any previous visit (from the past 24 hours).  No results found.   Assessment and Plan :     Discharge Instructions       1. Acute chest wall pain (Primary) - ED EKG performed in UC shows sinus bradycardia, no STEMI, normal EKG, ventricular rate of 53 bpm. - cyclobenzaprine  (FLEXERIL ) 10 MG tablet; Take 1 tablet (10 mg total) by mouth 2 (two) times daily as needed for muscle spasms.  Dispense: 20 tablet; Refill: 0 - Continue take daily meloxicam  with cyclobenzaprine  to help relax muscle and help with inflammation to chest wall. -Continue to monitor symptoms for any change in severity if there is any escalation of current symptoms or development of new symptoms follow-up in ER for further evaluation and management.      Sullivan Blasing B Antwione Picotte   Lloyde Ludlam, [place], [place] 02/20/24 [id number]

## 2024-02-20 NOTE — Telephone Encounter (Signed)
 FYI Only or Action Required?: FYI only for provider.  Patient was last seen in primary care on 01/30/2024 by Vicci Duwaine SQUIBB, DO.  Called Nurse Triage reporting Pain.  Symptoms began a week ago.  Interventions attempted: Nothing.  Symptoms are: unchanged.  Triage Disposition: See Physician Within 24 Hours  Patient/caregiver understands and will follow disposition?: Yes   Copied from CRM #[id number]. Topic: Clinical - Red Word Triage >> Feb 20, 2024 12:30 PM Delon HERO wrote: Red Word that prompte    Pain on Left peck on left side , sometimes arm, neck, back for on and off for about 1 week. Started losartan  (COZAAR ) 25 MG tablet [[id number]] 01/31/24 Reason for Disposition  [1] Chest pain lasts < 5 minutes AND [2] NO chest pain or cardiac symptoms (e.g., breathing difficulty, sweating) now  (Exception: Chest pains that last only a few seconds.)  Answer Assessment - Initial Assessment Questions Additional info:  1) New on losartan . 140/80 2) Requesting to schedule with PCP on Wednesday. Patient agreeable to urgent care evaluation today.   1. LOCATION: Where does it hurt?       Left sided peck. Unsure if he strained while at work, 'not sure if it is the muscle' 2. RADIATION: Does the pain go anywhere else? (e.g., into neck, jaw, arms, back)     Neck, back 3. ONSET: When did the chest pain begin? (Minutes, hours or days)      One week ago 4. PATTERN: Does the pain come and go, or has it been constant since it started?  Does it get worse with exertion?      intermittent 5. DURATION: How long does it last (e.g., seconds, minutes, hours)     varies 6. SEVERITY: How bad is the pain?  (e.g., Scale 1-10; mild, moderate, or severe)     Varies up to 5/10 7. CARDIAC RISK FACTORS: Do you have any history of heart problems or risk factors for heart disease? (e.g., angina, prior heart attack; diabetes, high blood pressure, high cholesterol, smoker, or strong family history of heart  disease)     New on htn medication 8. PULMONARY RISK FACTORS: Do you have any history of lung disease?  (e.g., blood clots in lung, asthma, emphysema, birth control pills)      9. CAUSE: What do you think is causing the chest pain?     unsure 10. OTHER SYMPTOMS: Do you have any other symptoms? (e.g., dizziness, nausea, vomiting, sweating, fever, difficulty breathing, cough)     Headache intermittently  Protocols used: Chest Pain-A-AH

## 2024-02-28 ENCOUNTER — Ambulatory Visit: Admitting: Family Medicine

## 2024-03-16 ENCOUNTER — Ambulatory Visit: Admitting: Family Medicine

## 2024-04-20 ENCOUNTER — Encounter: Payer: Self-pay | Admitting: Family Medicine

## 2024-04-20 ENCOUNTER — Ambulatory Visit: Payer: Self-pay

## 2024-04-20 ENCOUNTER — Ambulatory Visit: Admitting: Family Medicine

## 2024-04-20 VITALS — BP 166/100 | HR 70 | Ht 69.0 in | Wt 191.0 lb

## 2024-04-20 DIAGNOSIS — R1013 Epigastric pain: Secondary | ICD-10-CM | POA: Diagnosis not present

## 2024-04-20 DIAGNOSIS — Z789 Other specified health status: Secondary | ICD-10-CM

## 2024-04-20 DIAGNOSIS — I10 Essential (primary) hypertension: Secondary | ICD-10-CM | POA: Diagnosis not present

## 2024-04-20 MED ORDER — SUCRALFATE 1 GM/10ML PO SUSP: 1.0000 g | Freq: Three times a day (TID) | ORAL | 0 refills | Status: DC

## 2024-04-20 MED ORDER — PANTOPRAZOLE SODIUM 40 MG PO TBEC: 40.0000 mg | DELAYED_RELEASE_TABLET | Freq: Every day | ORAL | 0 refills | Status: DC

## 2024-04-20 NOTE — Progress Notes (Signed)
 Acute Office Visit  Subjective:     Patient ID: Jacob Key, male    DOB: Aug 30, 1980, 43 y.o.   MRN: [id number]  Chief Complaint  Patient presents with   Abdominal Pain    X 1.5 weeks, sharp pain around stomach, sometimes feelings like a cramp on the center of his stomach     HPI Patient is in today for acute visit.  Discussed the use of AI scribe software for clinical note transcription with the patient, who gave verbal consent to proceed.  History of Present Illness Jacob Key is a 43 year old male who presents with abdominal pain.  He has been experiencing abdominal pain for the past week to week and a half. The pain is sometimes located in the middle of his abdomen, right under the rib cage, and sometimes presents as cramps on both sides. He describes the sensation as a burning pain that can radiate to the lower abdomen. He has tried using heating pads and icy hot patches, and is prescribed meloxicam , which has not provided relief.  No history of similar pain, nausea, vomiting, diarrhea, or blood in the stool. He reports mild nausea over the last couple of days but is tolerating food and water well without loss of appetite. He occasionally experiences constipation and has a history of heartburn but no history of ulcers.  He started taking blood pressure medication at the end of August and has been monitoring his blood pressure at home, though he has reduced the frequency of checks recently.  He consumes alcohol, typically having a couple of drinks at night to help with sleep, and reports drinking four beers the previous night. He also vapes.  He has a history of hip and leg pain for which he was prescribed meloxicam , and he has been seeing an orthopedist for lower back and hip pain. He was previously prescribed Zofran  for nausea associated with headaches.    Review of Systems  All other systems reviewed and are negative.       Objective:    BP (!) 166/100    Pulse 70   Ht 5' 9 (1.753 m)   Wt 191 lb (86.6 kg)   SpO2 98%   BMI 28.21 kg/m     Physical Exam Vitals and nursing note reviewed.  Constitutional:      Appearance: Normal appearance.  HENT:     Head: Normocephalic.     Right Ear: External ear normal.     Left Ear: External ear normal.  Eyes:     Conjunctiva/sclera: Conjunctivae normal.  Cardiovascular:     Rate and Rhythm: Normal rate.  Pulmonary:     Effort: Pulmonary effort is normal. No respiratory distress.  Abdominal:     Palpations: Abdomen is soft.  Musculoskeletal:        General: Normal range of motion.  Skin:    General: Skin is warm.  Neurological:     Mental Status: He is alert and oriented to person, place, and time.  Psychiatric:        Mood and Affect: Mood normal.     No results found for any visits on 04/20/24.      Assessment & Plan:   Problem List Items Addressed This Visit       Cardiovascular and Mediastinum   Primary hypertension     Other   Alcohol consumption four to six days per week   Other Visit Diagnoses       Epigastric pain    -  Primary   Relevant Medications   sucralfate (CARAFATE) 1 GM/10ML suspension   pantoprazole (PROTONIX) 40 MG tablet   Other Relevant Orders   Comprehensive Metabolic Panel (CMET)   Lipase   CBC with Differential       Meds ordered this encounter  Medications   sucralfate (CARAFATE) 1 GM/10ML suspension    Sig: Take 10 mLs (1 g total) by mouth 4 (four) times daily -  with meals and at bedtime.    Dispense:  420 mL    Refill:  0   pantoprazole (PROTONIX) 40 MG tablet    Sig: Take 1 tablet (40 mg total) by mouth daily.    Dispense:  30 tablet    Refill:  0  Assessment and Plan Assessment & Plan Abdominal pain with suspected gastritis and possible pancreatitis Abdominal pain with burning sensation, possibly due to gastritis or pancreatitis. Alcohol use and meloxicam  may exacerbate symptoms. - Ordered blood work for pancreatic and liver  function. - Prescribed Carafate suspension four times daily on an empty stomach. - Advised cessation of alcohol, coffee, and vaping. - Recommended non-fatty soups for bowel rest. - Instructed to stop meloxicam . - Advised emergency care if pain worsens or if fever, nausea, or blood in stool occur.  Alcohol use disorder Regular alcohol consumption may contribute to abdominal pain and potential pancreatitis. - Advised cessation of alcohol consumption.  Essential hypertension Hypertension possibly exacerbated by alcohol use or pain. No recent home monitoring. - Advised follow-up with primary care for blood pressure management. - Discussed potential need for medication adjustment.    No follow-ups on file.  Vinary K Latanya Hemmer, MD

## 2024-04-20 NOTE — Telephone Encounter (Signed)
 FYI Only or Action Required?: FYI only for provider: appointment scheduled on 04/20/24.  Patient was last seen in primary care on 01/30/2024 by Vicci Duwaine SQUIBB, DO.  Called Nurse Triage reporting Abdominal Pain.  Symptoms began a week ago.  Interventions attempted: OTC medications: icy hot, meloxicam  and Rest, hydration, or home remedies.  Symptoms are: unchanged.  Triage Disposition: See Physician Within 24 Hours  Patient/caregiver understands and will follow disposition?: Yes    Copied from CRM #[id number]. Topic: Clinical - Red Word Triage >> Apr 20, 2024 10:17 AM Tinnie BROCKS wrote: Red Word that prompted transfer to Nurse Triage: Abdominal pain for 1.5 weeks, can be 7 or 8 out of 10 pain. Reason for Disposition  [1] MODERATE pain (e.g., interferes with normal activities) AND [2] pain comes and goes (cramps) AND [3] present > 24 hours  (Exception: Pain with Vomiting or Diarrhea - see that Guideline.)  Answer Assessment - Initial Assessment Questions 1. LOCATION: Where does it hurt?      Upper  2. RADIATION: Does the pain shoot anywhere else? (e.g., chest, back)     Denies  3. ONSET: When did the pain begin? (Minutes, hours or days ago)      1.5 weeks ago  4. SUDDEN: Gradual or sudden onset?     Gradual  5. PATTERN Does the pain come and go, or is it constant?     Intermittent  6. SEVERITY: How bad is the pain?  (e.g., Scale 1-10; mild, moderate, or severe)     7/10 cramping  7. RECURRENT SYMPTOM: Have you ever had this type of stomach pain before? If Yes, ask: When was the last time? and What happened that time?      Yes  8. CAUSE: What do you think is causing the stomach pain? (e.g., gallstones, recent abdominal surgery)     Unsure  9. RELIEVING/AGGRAVATING FACTORS: What makes it better or worse? (e.g., antacids, bending or twisting motion, bowel movement)     Icy hot helps some. Physical activity makes it worse, eating will cause increased pain.  10.  OTHER SYMPTOMS: Do you have any other symptoms? (e.g., back pain, diarrhea, fever, urination pain, vomiting)      Slight nausea  Protocols used: Abdominal Pain - Male-A-AH

## 2024-04-21 LAB — COMPREHENSIVE METABOLIC PANEL WITH GFR
ALT: 38 IU/L (ref 0–44)
AST: 32 IU/L (ref 0–40)
Albumin: 4.6 g/dL (ref 4.1–5.1)
Alkaline Phosphatase: 66 IU/L (ref 47–123)
BUN/Creatinine Ratio: 20 (ref 9–20)
BUN: 19 mg/dL (ref 6–24)
Bilirubin Total: 0.4 mg/dL (ref 0.0–1.2)
CO2: 27 mmol/L (ref 20–29)
Calcium: 10 mg/dL (ref 8.7–10.2)
Chloride: 100 mmol/L (ref 96–106)
Creatinine, Ser: 0.94 mg/dL (ref 0.76–1.27)
Globulin, Total: 2.7 g/dL (ref 1.5–4.5)
Glucose: 81 mg/dL (ref 70–99)
Potassium: 4.6 mmol/L (ref 3.5–5.2)
Sodium: 141 mmol/L (ref 134–144)
Total Protein: 7.3 g/dL (ref 6.0–8.5)
eGFR: 103 mL/min/1.73 (ref >59)

## 2024-04-21 LAB — CBC WITH DIFFERENTIAL/PLATELET
Basophils Absolute: 0 x10E3/uL (ref 0.0–0.2)
Basos: 1 %
EOS (ABSOLUTE): 0.1 x10E3/uL (ref 0.0–0.4)
Eos: 1 %
Hematocrit: 45.8 % (ref 37.5–51.0)
Hemoglobin: 15.5 g/dL (ref 13.0–17.7)
Immature Grans (Abs): 0 x10E3/uL (ref 0.0–0.1)
Immature Granulocytes: 0 %
Lymphocytes Absolute: 1.3 x10E3/uL (ref 0.7–3.1)
Lymphs: 21 %
MCH: 31.5 pg (ref 26.6–33.0)
MCHC: 33.8 g/dL (ref 31.5–35.7)
MCV: 93 fL (ref 79–97)
Monocytes Absolute: 0.4 x10E3/uL (ref 0.1–0.9)
Monocytes: 6 %
Neutrophils Absolute: 4.2 x10E3/uL (ref 1.4–7.0)
Neutrophils: 71 %
Platelets: 273 x10E3/uL (ref 150–450)
RBC: 4.92 x10E6/uL (ref 4.14–5.80)
RDW: 12.3 % (ref 11.6–15.4)
WBC: 6 x10E3/uL (ref 3.4–10.8)

## 2024-04-21 LAB — LIPASE: Lipase: 50 U/L (ref 13–78)

## 2024-04-22 ENCOUNTER — Emergency Department

## 2024-04-22 ENCOUNTER — Other Ambulatory Visit: Payer: Self-pay

## 2024-04-22 ENCOUNTER — Encounter: Payer: Self-pay | Admitting: Emergency Medicine

## 2024-04-22 ENCOUNTER — Emergency Department
Admission: EM | Admit: 2024-04-22 | Discharge: 2024-04-22 | Disposition: A | Discharge status: Home / Self Care | Attending: Emergency Medicine | Admitting: Emergency Medicine

## 2024-04-22 DIAGNOSIS — R1013 Epigastric pain: Secondary | ICD-10-CM | POA: Diagnosis not present

## 2024-04-22 DIAGNOSIS — I10 Essential (primary) hypertension: Secondary | ICD-10-CM | POA: Insufficient documentation

## 2024-04-22 DIAGNOSIS — R1012 Left upper quadrant pain: Secondary | ICD-10-CM | POA: Insufficient documentation

## 2024-04-22 LAB — CBC
HCT: 44.3 % (ref 39.0–52.0)
Hemoglobin: 15.3 g/dL (ref 13.0–17.0)
MCH: 31.5 pg (ref 26.0–34.0)
MCHC: 34.5 g/dL (ref 30.0–36.0)
MCV: 91.2 fL (ref 80.0–100.0)
Platelets: 250 K/uL (ref 150–400)
RBC: 4.86 MIL/uL (ref 4.22–5.81)
RDW: 11.8 % (ref 11.5–15.5)
WBC: 5.2 K/uL (ref 4.0–10.5)
nRBC: 0 % (ref 0.0–0.2)

## 2024-04-22 LAB — TROPONIN I (HIGH SENSITIVITY)
Troponin I (High Sensitivity): 4 ng/L (ref <18)
Troponin I (High Sensitivity): 8 ng/L (ref <18)

## 2024-04-22 LAB — URINALYSIS, ROUTINE W REFLEX MICROSCOPIC
Bilirubin Urine: NEGATIVE
Glucose, UA: NEGATIVE mg/dL
Hgb urine dipstick: NEGATIVE
Ketones, ur: NEGATIVE mg/dL
Leukocytes,Ua: NEGATIVE
Nitrite: NEGATIVE
Protein, ur: NEGATIVE mg/dL
Specific Gravity, Urine: 1.014 (ref 1.005–1.030)
pH: 7 (ref 5.0–8.0)

## 2024-04-22 LAB — COMPREHENSIVE METABOLIC PANEL WITH GFR
ALT: 37 U/L (ref 0–44)
AST: 35 U/L (ref 15–41)
Albumin: 4.3 g/dL (ref 3.5–5.0)
Alkaline Phosphatase: 57 U/L (ref 38–126)
Anion gap: 13 (ref 5–15)
BUN: 24 mg/dL — ABNORMAL HIGH (ref 6–20)
CO2: 27 mmol/L (ref 22–32)
Calcium: 9.5 mg/dL (ref 8.9–10.3)
Chloride: 100 mmol/L (ref 98–111)
Creatinine, Ser: 0.93 mg/dL (ref 0.61–1.24)
GFR, Estimated: >60 mL/min (ref >60)
Glucose, Bld: 109 mg/dL — ABNORMAL HIGH (ref 70–99)
Potassium: 4.6 mmol/L (ref 3.5–5.1)
Sodium: 140 mmol/L (ref 135–145)
Total Bilirubin: 0.9 mg/dL (ref 0.0–1.2)
Total Protein: 8.3 g/dL — ABNORMAL HIGH (ref 6.5–8.1)

## 2024-04-22 LAB — LIPASE, BLOOD: Lipase: 45 U/L (ref 11–51)

## 2024-04-22 MED ORDER — ALUM & MAG HYDROXIDE-SIMETH 200-200-20 MG/5ML PO SUSP
15.0000 mL | Freq: Once | ORAL | Status: AC
  Administered 2024-04-22: 15 mL via ORAL
  Filled 2024-04-22: qty 30

## 2024-04-22 MED ORDER — ONDANSETRON HCL 4 MG/2ML IJ SOLN
4.0000 mg | INTRAMUSCULAR | Status: AC
  Administered 2024-04-22: 4 mg via INTRAVENOUS
  Filled 2024-04-22: qty 2

## 2024-04-22 MED ORDER — IOHEXOL 350 MG/ML SOLN
100.0000 mL | Freq: Once | INTRAVENOUS | Status: AC | PRN
  Administered 2024-04-22: 100 mL via INTRAVENOUS

## 2024-04-22 MED ORDER — LOSARTAN POTASSIUM 50 MG PO TABS
50.0000 mg | ORAL_TABLET | ORAL | Status: AC
  Administered 2024-04-22: 50 mg via ORAL
  Filled 2024-04-22: qty 1

## 2024-04-22 NOTE — ED Triage Notes (Signed)
 Patient arrives ambulatory by POV states he has been having generalized abdominal pain for week and a half. States he went to PCP Friday and diagnosed with gastritis and pancreatitis. States he was working in his yard today and began feeling dizzy.

## 2024-04-22 NOTE — ED Provider Notes (Signed)
 [facility] Provider Note    Event Date/Time   First MD Initiated Contact with Patient 04/22/24 1645     (approximate)   History   Abdominal Pain   HPI  Jacob Key is a 43 y.o. male   history of hypertension  Patient about a week ago started developing pain in his mid to left upper abdomen.  He went to urgent care a couple days ago as it has not improved and they told him they were concerned he might have pancreatitis and sent lab tests.  When he got the lab test back they came back normal.  He is noticed that he continues to have this discomfort like a pain hard to describe below the left ribs and his left upper abdomen.  He said no fevers no chills he is eating and drinking normally.  There is been no nausea or vomiting.  They did tell him to start taking an acid medicine which she has been on which is a PPI, and sucralfate and they told him there was also present potentially could have an ulcer.  I did advise him to stop use of alcohol as he has 2 drinks of alcohol daily  He is also noticed sometimes the discomfort sort of feels like it goes into his left chest at times.  Hard to describe.  Has been persistent now for about a week  He does take blood pressure medicine daily but noticed that his blood pressure was a bit high at urgent care and it seems high here in the ER.  No shortness of breath no leg swelling no headaches no vision change  He does occasionally take meloxicam  or Excedrin.  He has not noticed any blood in his stool denies history of ulcer      Physical Exam   Triage Vital Signs: ED Triage Vitals  Encounter Vitals Group     BP 04/22/24 1444 (!) 193/98     Girls Systolic BP Percentile --      Girls Diastolic BP Percentile --      Boys Systolic BP Percentile --      Boys Diastolic BP Percentile --      Pulse Rate 04/22/24 1444 71     Resp 04/22/24 1444 16     Temp 04/22/24 1444 97.9 F (36.6 C)     Temp Source 04/22/24 1444  Oral     SpO2 04/22/24 1444 99 %     Weight 04/22/24 1447 191 lb (86.6 kg)     Height 04/22/24 1447 5' 9 (1.753 m)     Head Circumference --      Peak Flow --      Pain Score 04/22/24 1447 6     Pain Loc --      Pain Education --      Exclude from Growth Chart --     Most recent vital signs: Vitals:   04/22/24 1927 04/22/24 2209  BP: (!) 153/92 (!) 163/106  Pulse: (!) 58 (!) 56  Resp: (!) 25 18  Temp: 97.7 F (36.5 C) 97.7 F (36.5 C)  SpO2: 100% 99%     General: Awake, no distress.  Very pleasant CV:  Good peripheral perfusion.  Normal tones and rate Resp:  Normal effort.  Clear bilateral Abd:  No distention.  Mild tenderness to palpation of left upper quadrant and slight in the epigastrium no rebound or guarding.  No right upper quadrant pain.  No lower abdominal pain.  Relates at times it feels like it go a little bit towards his left upper or mid back and occasionally towards the left chest but not actively Other:  No edema.  No jaundice no pain McBurney's point negative Murphy's   ED Results / Procedures / Treatments   Labs (all labs ordered are listed, but only abnormal results are displayed) Labs Reviewed  COMPREHENSIVE METABOLIC PANEL WITH GFR - Abnormal; Notable for the following components:      Result Value   Glucose, Bld 109 (*)    BUN 24 (*)    Total Protein 8.3 (*)    All other components within normal limits  URINALYSIS, ROUTINE W REFLEX MICROSCOPIC - Abnormal; Notable for the following components:   Color, Urine STRAW (*)    APPearance CLEAR (*)    All other components within normal limits  LIPASE, BLOOD  CBC  TROPONIN I (HIGH SENSITIVITY)  TROPONIN I (HIGH SENSITIVITY)     EKG  Inter by me at 1740 heart rate 55 QRS 110 QTc 400 Normal sinus rhythm no evidence of acute ischemia.  Mild repolarization abnormality or potential mild LVH.  No evidence of acute ischemia   RADIOLOGY  CT Angio Chest/Abd/Pel for Dissection W and/or Wo  Contrast Result Date: 04/22/2024 EXAM: CTA CHEST, ABDOMEN AND PELVIS WITH AND WITHOUT CONTRAST 04/22/2024 06:36:54 PM TECHNIQUE: CTA of the chest was performed with and without the administration of intravenous contrast. CTA of the abdomen and pelvis was performed with and without the administration of intravenous contrast. Multiplanar reformatted images are provided for review. MIP images are provided for review. Automated exposure control, iterative reconstruction, and/or weight based adjustment of the mA/kV was utilized to reduce the radiation dose to as low as reasonably achievable. COMPARISON: No comparison study. CLINICAL HISTORY: Acute aortic syndrome (AAS) suspected; chest and mid abd pain for few days, hypertension. pancreatitis suspected by PCP a couple days ago. Eval for acute itnraabdominal or aortic. FINDINGS: VASCULATURE: AORTA: Initial precontrast images show no aneurysmal dilatation of the aorta. No hyperdense crescent to suggest acute aortic injury is seen. Postcontrast images show the aorta and its branches are within normal limits. No dissection is seen. PULMONARY ARTERIES: The pulmonary artery shows no definitive pulmonary emboli, although it is not timed for embolus evaluation. GREAT VESSELS OF AORTIC ARCH: No dissection. No arterial occlusion or significant stenosis. CELIAC TRUNK: No occlusion or significant stenosis. SUPERIOR MESENTERIC ARTERY: No occlusion or significant stenosis. INFERIOR MESENTERIC ARTERY: No occlusion or significant stenosis. RENAL ARTERIES: No occlusion or significant stenosis. ILIAC ARTERIES: No occlusion or significant stenosis. CHEST: MEDIASTINUM: The heart is at the upper limits of normal in size. No coronary calcifications are seen. No mediastinal lymphadenopathy. The thoracic inlet is within normal limits. No hilar adenopathy is noted. The esophagus is within normal limits. LUNGS AND PLEURA: The lungs are well aerated bilaterally. No sizable parenchymal nodule is  seen. No focal infiltrate or sizable effusion is noted. THORACIC BONES AND SOFT TISSUES: Bony structures appear within normal limits. ABDOMEN AND PELVIS: LIVER: The liver is within normal limits. GALLBLADDER AND BILE DUCTS: The gallbladder is within normal limits. No biliary ductal dilatation. SPLEEN: The spleen is normal. PANCREAS: The pancreas is normal. ADRENAL GLANDS: The adrenal glands are within normal limits. KIDNEYS, URETERS AND BLADDER: The kidneys demonstrate a normal enhancement pattern. No obstructive changes are seen. No renal calculi are noted. The bladder is within normal limits. GI AND BOWEL: Stomach and duodenal sweep are within normal limits. No obstructive or inflammatory  changes of the colon are seen. The appendix is not well visualized. No appendiceal inflammatory changes are noted to suggest appendicitis. Small bowel is within normal limits. There is no bowel obstruction. No abnormal bowel wall thickening or distension. REPRODUCTIVE: Prostate is within normal limits. PERITONEUM AND RETROPERITONEUM: No free fluid or free air is noted. LYMPH NODES: No lymphadenopathy. ABDOMINAL BONES AND SOFT TISSUES: The bony structures are within normal limits. IMPRESSION: 1. No evidence of acute aortic abnormality 2.  No acute abnormality to correspond with given clinical history is noted. Electronically signed by: Oneil Devonshire MD 04/22/2024 07:09 PM EST RP Workstation: [id number]      PROCEDURES:  Critical Care performed: No  Procedures   MEDICATIONS ORDERED IN ED: Medications  ondansetron  (ZOFRAN ) injection 4 mg (4 mg Intravenous Given 04/22/24 1841)  losartan  (COZAAR ) tablet 50 mg (50 mg Oral Given 04/22/24 1750)  alum & mag hydroxide-simeth (MAALOX/MYLANTA) 200-200-20 MG/5ML suspension 15 mL (15 mLs Oral Given 04/22/24 1750)  iohexol (OMNIPAQUE) 350 MG/ML injection 100 mL (100 mLs Intravenous Contrast Given 04/22/24 1828)     IMPRESSION / MDM / ASSESSMENT AND PLAN / ED COURSE  I reviewed  the triage vital signs and the nursing notes.                              Differential diagnosis includes, but is not limited to, given the history causes such as GI gastritis peptic ulcer pancreatitis do seem likely especially in the use of his alcohol regularly and NSAIDs etc.  However his testing at urgent care looks quite reassuring and his GI testing lipase and LFTs were normal here as well.  Will also check cardiac testing as slight atypical component I suspect is most likely GI but he does have some risk factors and hypertension.  He does not display any obvious evidence of ACS and after evaluation he has 2 normal troponins and his ECG reassuring given his history I think very low likelihood of ACS but do recommend he follow-up with his primary and consider stress testing as well which he is agreeable with.  Discussed careful chest pain precautions in addition to GI precautions  His blood pressure was notably elevated improved after treatment here.  Given single dose and he is responded well.  He has an appointment he is going to see his doctor tomorrow at 9:45 AM with his primary care and will follow-up on all these issues.  No evidence of hypertensive emergency  Vitals:   04/22/24 1927 04/22/24 2209  BP: (!) 153/92 (!) 163/106  Pulse: (!) 58 (!) 56  Resp: (!) 25 18  Temp: 97.7 F (36.5 C) 97.7 F (36.5 C)  SpO2: 100% 99%     Patient's presentation is most consistent with acute presentation with potential threat to life or bodily function.   The patient is on the cardiac monitor to evaluate for evidence of arrhythmia and/or significant heart rate changes.  Clinical Course as of 04/22/24 2212  [place] Apr 22, 2024  2125 When resting BPs 144/81. 153/92. Elevates when I am at bedside. Asympomatic at this time. Has PCP appt tomorrow 945a.  [MQ]  2142 Resting comfortably without distress.  Advised to actually has a primary care doctor appointment tomorrow at 9:45 AM.  He will follow-up  close with the primary care and discuss his symptoms recommended close attention to his blood pressure, and follow-up with gastroenterology and consideration for cardiac stress testing as well.  Thus far workup very very reassuring [MQ]    Clinical Course User Index [MQ] Dicky Anes, MD   Workup very reassuring.  I had discussed with patient some concerns this is likely of gastrointestinal nature though cannot be certain.  Will be following up with cardiology primary care as well as GI.  He will continue the proton pump inhibitor that he was prescribed and worked to decrease alcohol intake.  Discussed careful return precautions around his symptoms including chest pain return precautions, symptoms of hypertensive urgency, and also GI return precautions such as severe abdominal pain nausea vomiting bleeding fevers etc.  Patient agreeable  Return precautions and treatment recommendations and follow-up discussed with the patient who is agreeable with the plan.  Patient has follow-up which he will go to with his doctor tomorrow he reports appointment at 9:45 AM  FINAL CLINICAL IMPRESSION(S) / ED DIAGNOSES   Final diagnoses:  LUQ abdominal pain  Epigastric pain  Primary hypertension     Rx / DC Orders   ED Discharge Orders          Ordered    Ambulatory referral to Cardiology       Comments: Left upper quadrant pain some slight chest slight component.  Query if the patient may need cardiac workup or stress test   04/22/24 2206             Note:  This document was prepared using Dragon voice recognition software and may include unintentional dictation errors.   Dicky Anes, MD 04/22/24 2212

## 2024-04-22 NOTE — Discharge Instructions (Addendum)
 Please go to your primary care appointment at 9:45 AM tomorrow.  This is extremely important.  Return to the emergency room right away if you develop any severe chest pain difficulty breathing, high fevers develop any bloody stools begin vomiting have severe worsening or increasing concerns of abdominal pain or other concerns arise.  Talk to your doctor carefully about your blood pressure.  Have them check that do notify them that we gave you an additional 50 mg of your blood pressure medicine here today, and that your blood pressure needs to be rechecked.  Also I recommend follow-up and discussion with your doctor about referral to gastroenterology and possibly cardiology for stress test as well.

## 2024-04-22 NOTE — ED Notes (Signed)
 Pt given water

## 2024-04-23 ENCOUNTER — Encounter: Payer: Self-pay | Admitting: Family Medicine

## 2024-04-23 ENCOUNTER — Ambulatory Visit: Payer: Self-pay | Admitting: Family Medicine

## 2024-04-23 ENCOUNTER — Ambulatory Visit: Admitting: Family Medicine

## 2024-04-23 VITALS — BP 146/100 | HR 68 | Temp 97.6°F | Ht 69.0 in | Wt 192.0 lb

## 2024-04-23 DIAGNOSIS — R1013 Epigastric pain: Secondary | ICD-10-CM | POA: Diagnosis not present

## 2024-04-23 DIAGNOSIS — I10 Essential (primary) hypertension: Secondary | ICD-10-CM

## 2024-04-23 MED ORDER — LOSARTAN POTASSIUM 50 MG PO TABS: 50.0000 mg | ORAL_TABLET | Freq: Every day | ORAL | 1 refills | Status: DC

## 2024-04-23 MED ORDER — SUCRALFATE 1 G PO TABS: 1.0000 g | ORAL_TABLET | Freq: Three times a day (TID) | ORAL | 2 refills | Status: AC

## 2024-04-23 NOTE — Progress Notes (Signed)
 BP (!) 146/100   Pulse 68   Temp 97.6 F (36.4 C) (Oral)   Ht 5' 9 (1.753 m)   Wt 192 lb (87.1 kg)   SpO2 98%   BMI 28.35 kg/m    Subjective:    Patient ID: Jacob Key, male    DOB: September 16, 1980, 43 y.o.   MRN: [id number]  HPI: Jacob Key is a 44 y.o. male  Chief Complaint  Patient presents with   Follow-up   Hypertension   Abdominal Pain    Went to ED last night 11/9   ABDOMINAL PAIN  Duration: 2 week Onset: sudden Severity: severe Quality: dull in the middle, sharp on the sides Location:  epigastric  Episode duration:  Radiation: lower belly Frequency: intermittent Alleviating factors:  Aggravating factors: Status: stable Treatments attempted: PPI Fever: no Nausea: no Vomiting: no Weight loss: no Decreased appetite: no Diarrhea: no Constipation: no Blood in stool: no Heartburn: no Jaundice: no Rash: no Dysuria/urinary frequency: no Hematuria: no History of sexually transmitted disease: no Recurrent NSAID use: yes  HYPERTENSION  Hypertension status: uncontrolled  Satisfied with current treatment? no Duration of hypertension: chronic BP monitoring frequency:  rarely BP medication side effects:  no Medication compliance: excellent compliance Previous BP meds: losartan  Aspirin: no Recurrent headaches: no Visual changes: no Palpitations: no Dyspnea: no Chest pain: no- epigastric though Lower extremity edema: no Dizzy/lightheaded: no   Relevant past medical, surgical, family and social history reviewed and updated as indicated. Interim medical history since our last visit reviewed. Allergies and medications reviewed and updated.  Review of Systems  Constitutional: Negative.   Respiratory: Negative.    Cardiovascular: Negative.   Gastrointestinal:  Positive for abdominal pain. Negative for abdominal distention, anal bleeding, blood in stool, constipation, diarrhea, nausea, rectal pain and vomiting.  Musculoskeletal: Negative.    Neurological: Negative.   Psychiatric/Behavioral: Negative.      Per HPI unless specifically indicated above     Objective:    BP (!) 146/100   Pulse 68   Temp 97.6 F (36.4 C) (Oral)   Ht 5' 9 (1.753 m)   Wt 192 lb (87.1 kg)   SpO2 98%   BMI 28.35 kg/m   Wt Readings from Last 3 Encounters:  04/23/24 192 lb (87.1 kg)  04/22/24 191 lb (86.6 kg)  04/20/24 191 lb (86.6 kg)    Physical Exam Vitals and nursing note reviewed.  Constitutional:      General: He is not in acute distress.    Appearance: Normal appearance. He is not ill-appearing, toxic-appearing or diaphoretic.  HENT:     Head: Normocephalic and atraumatic.     Right Ear: External ear normal.     Left Ear: External ear normal.     Nose: Nose normal.     Mouth/Throat:     Mouth: Mucous membranes are moist.     Pharynx: Oropharynx is clear.  Eyes:     General: No scleral icterus.       Right eye: No discharge.        Left eye: No discharge.     Extraocular Movements: Extraocular movements intact.     Conjunctiva/sclera: Conjunctivae normal.     Pupils: Pupils are equal, round, and reactive to light.  Cardiovascular:     Rate and Rhythm: Normal rate and regular rhythm.     Pulses: Normal pulses.     Heart sounds: Normal heart sounds. No murmur heard.    No friction rub. No gallop.  Pulmonary:  Effort: Pulmonary effort is normal. No respiratory distress.     Breath sounds: Normal breath sounds. No stridor. No wheezing, rhonchi or rales.  Chest:     Chest wall: No tenderness.  Musculoskeletal:        General: Normal range of motion.     Cervical back: Normal range of motion and neck supple.  Skin:    General: Skin is warm and dry.     Capillary Refill: Capillary refill takes less than 2 seconds.     Coloration: Skin is not jaundiced or pale.     Findings: No bruising, erythema, lesion or rash.  Neurological:     General: No focal deficit present.     Mental Status: He is alert and oriented to  person, place, and time. Mental status is at baseline.  Psychiatric:        Mood and Affect: Mood normal.        Behavior: Behavior normal.        Thought Content: Thought content normal.        Judgment: Judgment normal.     Results for orders placed or performed during the hospital encounter of 04/22/24  Lipase, blood   Collection Time: 04/22/24  2:48 PM  Result Value Ref Range   Lipase 45 11 - 51 U/L  Comprehensive metabolic panel   Collection Time: 04/22/24  2:48 PM  Result Value Ref Range   Sodium 140 135 - 145 mmol/L   Potassium 4.6 3.5 - 5.1 mmol/L   Chloride 100 98 - 111 mmol/L   CO2 27 22 - 32 mmol/L   Glucose, Bld 109 (H) 70 - 99 mg/dL   BUN 24 (H) 6 - 20 mg/dL   Creatinine, Ser 9.06 0.61 - 1.24 mg/dL   Calcium  9.5 8.9 - 10.3 mg/dL   Total Protein 8.3 (H) 6.5 - 8.1 g/dL   Albumin 4.3 3.5 - 5.0 g/dL   AST 35 15 - 41 U/L   ALT 37 0 - 44 U/L   Alkaline Phosphatase 57 38 - 126 U/L   Total Bilirubin 0.9 0.0 - 1.2 mg/dL   GFR, Estimated >39 >39 mL/min   Anion gap 13 5 - 15  CBC   Collection Time: 04/22/24  2:48 PM  Result Value Ref Range   WBC 5.2 4.0 - 10.5 K/uL   RBC 4.86 4.22 - 5.81 MIL/uL   Hemoglobin 15.3 13.0 - 17.0 g/dL   HCT 55.6 60.9 - 47.9 %   MCV 91.2 80.0 - 100.0 fL   MCH 31.5 26.0 - 34.0 pg   MCHC 34.5 30.0 - 36.0 g/dL   RDW 88.1 88.4 - 84.4 %   Platelets 250 150 - 400 K/uL   nRBC 0.0 0.0 - 0.2 %  Urinalysis, Routine w reflex microscopic -Urine, Clean Catch   Collection Time: 04/22/24  2:48 PM  Result Value Ref Range   Color, Urine STRAW (A) YELLOW   APPearance CLEAR (A) CLEAR   Specific Gravity, Urine 1.014 1.005 - 1.030   pH 7.0 5.0 - 8.0   Glucose, UA NEGATIVE NEGATIVE mg/dL   Hgb urine dipstick NEGATIVE NEGATIVE   Bilirubin Urine NEGATIVE NEGATIVE   Ketones, ur NEGATIVE NEGATIVE mg/dL   Protein, ur NEGATIVE NEGATIVE mg/dL   Nitrite NEGATIVE NEGATIVE   Leukocytes,Ua NEGATIVE NEGATIVE  Troponin I (High Sensitivity)   Collection Time:  04/22/24  2:48 PM  Result Value Ref Range   Troponin I (High Sensitivity) 4 <18 ng/L  Troponin I (High  Sensitivity)   Collection Time: 04/22/24  7:18 PM  Result Value Ref Range   Troponin I (High Sensitivity) 8 <18 ng/L      Assessment & Plan:   Problem List Items Addressed This Visit       Cardiovascular and Mediastinum   Primary hypertension - Primary   Not under good control. Will increase his losartan  to 50mg  and recheck in 1 month. Call with any concerns.       Relevant Medications   losartan  (COZAAR ) 50 MG tablet   Other Visit Diagnoses       Epigastric pain       Has not been able to pick up his carafate yet- will send in pills and start. Referral to GI placed today. Referral to cardiology pending. Call with concerns.   Relevant Orders   Ambulatory referral to Gastroenterology        Follow up plan: Return in about 4 weeks (around 05/21/2024).

## 2024-04-23 NOTE — Assessment & Plan Note (Signed)
 Not under good control. Will increase his losartan  to 50mg  and recheck in 1 month. Call with any concerns.

## 2024-04-26 ENCOUNTER — Ambulatory Visit: Payer: Self-pay

## 2024-04-26 NOTE — Telephone Encounter (Signed)
 FYI Only or Action Required?: Action required by provider: update on patient condition.  Patient was last seen in primary care on 04/23/2024 by Vicci Duwaine SQUIBB, DO.  Called Nurse Triage reporting Abdominal Pain and Headache.  Symptoms began several weeks ago.  Interventions attempted: Prescription medications: Losartan , antacid and Rest, hydration, or home remedies.  Symptoms are: BP improving, abd pain, nausea, headaches not improving.  Triage Disposition: See Physician Within 24 Hours  Patient/caregiver understands and will follow disposition?: No   Copied from CRM #[id number]. Topic: Clinical - Red Word Triage >> Apr 26, 2024 10:51 AM Charlet HERO wrote: Red Word that prompted transfer to Nurse Triage: patient is calling stating that he is still having abdominal pain nausea and headaches , was in Er on Sunday. Dr Vicci Reason for Disposition  [1] MILD pain (e.g., does not interfere with normal activities) AND [2] pain comes and goes (cramps) [3] present > 48 hours  (Exception: This same abdominal pain is a chronic symptom recurrent or ongoing AND present > 4 weeks.)  Answer Assessment - Initial Assessment Questions Additional info: Patient calling to update provider that he has taken increased dose of losartan , bp improved to 145/88, still experiencing abdominal pain, nausea, and intermittent headaches. He would like to know what is causing these symptoms.    1. LOCATION: Where does it hurt?      Mid abdomen-left sided  2. RADIATION: Does the pain shoot anywhere else? (e.g., chest, back)     Denies  3. ONSET: When did the pain begin? (Minutes, hours or days ago)      Ongoing 2-3 weeks-evaluated in [facility] and ER 04/22/24 4. SUDDEN: Gradual or sudden onset?     sudden 5. PATTERN Does the pain come and go, or is it constant?     intermittent 6. SEVERITY: How bad is the pain?  (e.g., Scale 1-10; mild, moderate, or severe)     Mild-moderate 7. RECURRENT SYMPTOM: Have you  ever had this type of stomach pain before? If Yes, ask: When was the last time? and What happened that time?      Ongoing for a few weeks 8. CAUSE: What do you think is causing the stomach pain? (e.g., gallstones, recent abdominal surgery)     Unsure-thought is high blood pressure but increased dose of Losartan  on Monday and no change to abdominal pain. BP has improved to 145/88 9. RELIEVING/AGGRAVATING FACTORS: What makes it better or worse? (e.g., antacids, bending or twisting motion, bowel movement)     Not identified 10. OTHER SYMPTOMS: Do you have any other symptoms? (e.g., back pain, diarrhea, fever, urination pain, vomiting)       Nausea, intermittent headaches  Protocols used: Abdominal Pain - Male-A-AH

## 2024-04-26 NOTE — Telephone Encounter (Signed)
 His blood pressure was not controlled at his last visit and his medicine was changed 3 days ago. I would advise him to keep taking his blood pressure medicine as it can take up to 2 weeks to really work. He has had a work up at the ER for his abdominal pain and it was normal.

## 2024-04-26 NOTE — Telephone Encounter (Signed)
 Patient called back. I have advised him of Dr. Ferdie recommendation which he understands and is agreeable with. Patient had no further needs at this time.

## 2024-05-06 NOTE — Progress Notes (Deleted)
  Cardiology Office Note   Date:  05/06/2024  ID:  Randie Tallarico, DOB 1980-08-30, MRN [id number] PCP: Vicci Duwaine SQUIBB, DO  [facility] Providers Cardiologist:  None { Click to update primary MD,subspecialty MD or APP then REFRESH:1}    History of Present Illness Jacob Key is a 43 y.o. male PMH HLD, HTN who presents for further evaluation and management of chest discomfort.  Patient seen in the ED for this issue 04/22/2024.  He presented there for abdominal pain but then started to describe at least some chest component.  Serial troponin negative.  CBC, CMP unremarkable.  A CTA abdomen pelvis chest was obtained which was negative for dissection or anything else concerning.  Last LDL 137 01/2024.  Relevant CVD History -CT chest abdomen pelvis 04/2024 no aortic atherosclerosis or CAC   ROS: Pt denies any chest discomfort, jaw pain, arm pain, palpitations, syncope, presyncope, orthopnea, PND, or LE edema.  Studies Reviewed I have independently reviewed the patient's ECG, previous CT scan, previous medical records, previous blood work.  Physical Exam VS:  There were no vitals taken for this visit.       Wt Readings from Last 3 Encounters:  04/23/24 192 lb (87.1 kg)  04/22/24 191 lb (86.6 kg)  04/20/24 191 lb (86.6 kg)    GEN: No acute distress. NECK: No JVD; No carotid bruits. CARDIAC: ***RRR, no murmurs, rubs, gallops. RESPIRATORY:  Clear to auscultation. EXTREMITIES:  Warm and well-perfused. No edema.  ASSESSMENT AND PLAN Chest discomfort HLD        {Are you ordering a CV Procedure (e.g. stress test, cath, DCCV, TEE, etc)?   Press F2        :[id number]}  Dispo: ***  Signed, Caron Poser, MD

## 2024-05-09 ENCOUNTER — Ambulatory Visit

## 2024-05-14 ENCOUNTER — Other Ambulatory Visit: Payer: Self-pay | Admitting: Family Medicine

## 2024-05-14 DIAGNOSIS — R1013 Epigastric pain: Secondary | ICD-10-CM

## 2024-05-15 NOTE — Progress Notes (Unsigned)
  Cardiology Office Note   Date:  05/17/2024  ID:  Jacob Key, DOB 1980-06-28, MRN [id number] PCP: Vicci Duwaine SQUIBB, DO  [facility] Providers Cardiologist:  Caron Poser, MD     History of Present Illness Jacob Key is a 43 y.o. male PMH HLD, HTN who presents for further evaluation and management of chest discomfort.  Patient seen in the ED for this issue 04/22/2024.  He presented there for abdominal pain but then started to describe at least some chest component.  Serial troponin negative.  CBC, CMP unremarkable.  A CTA abdomen pelvis chest was obtained which was negative for dissection or anything else concerning.  Last LDL 137 01/2024.  Patient reports he is extremely active at work and he runs outside of work.  He says he never gets any chest discomfort or significant shortness of breath with these activities.  He denies any significant family history of ASCVD events.  He is a former smoker.  Relevant CVD History -CT chest abdomen pelvis 04/2024 no aortic atherosclerosis or CAC   ROS: Pt denies any chest discomfort, jaw pain, arm pain, palpitations, syncope, presyncope, orthopnea, PND, or LE edema.  Studies Reviewed I have independently reviewed the patient's ECG, previous CT scan, previous medical records, previous blood work.  Physical Exam VS:  BP 118/72 (BP Location: Right Arm, Patient Position: Sitting, Cuff Size: Normal)   Pulse (!) 49   Ht 5' 9 (1.753 m)   Wt 190 lb (86.2 kg)   SpO2 98%   BMI 28.06 kg/m        Wt Readings from Last 3 Encounters:  05/17/24 190 lb (86.2 kg)  04/23/24 192 lb (87.1 kg)  04/22/24 191 lb (86.6 kg)    GEN: No acute distress. NECK: No JVD; No carotid bruits. CARDIAC: RRR, no murmurs, rubs, gallops. RESPIRATORY:  Clear to auscultation. EXTREMITIES:  Warm and well-perfused. No edema.  ASSESSMENT AND PLAN Chest discomfort LVH by electrocardiogram Patient presents with only occasional chest discomfort which occurs when  he is actively using his left pectoral muscle; he said he had a surgery for this in the past and thinks that is related to musculoskeletal etiology.  He does not get any chest discomfort with running or doing any other significant exertional activities which is very common with his job.  He is an avid runner and does not have any symptoms while exerting himself.  He does have hypertension and hyperlipidemia which is reasonably well-controlled.  No significant family history.  At this point, would classify him as low pretest probability for obstructive CAD.  No CAC or aortic atherosclerosis present on recent CT scan.  Plan: - Discussed watchful waiting versus further testing with patient; he is essentially asymptomatic.  We came to a shared decision making agreement on watchful waiting for now.  As above, he is low risk.  I advised him to reach back out to me should he develop any new or concerning cardiac symptoms.  HLD Suboptimal control.  Last LDL 137 01/2024.  His Crestor  was then increased to 40 mg daily.  I think LDL goal less than 100 would be reasonable for him.  He notes that he will plan to follow-up with his PCP on this issue.  I think he can probably get to goal with the increased dose of Crestor  or possibly with addition of zetia.        Dispo: RTC 1 year or sooner as needed  Signed, Caron Poser, MD

## 2024-05-17 ENCOUNTER — Ambulatory Visit

## 2024-05-17 VITALS — BP 118/72 | HR 49 | Ht 69.0 in | Wt 190.0 lb

## 2024-05-17 DIAGNOSIS — I517 Cardiomegaly: Secondary | ICD-10-CM | POA: Diagnosis not present

## 2024-05-17 DIAGNOSIS — R079 Chest pain, unspecified: Secondary | ICD-10-CM | POA: Diagnosis not present

## 2024-05-17 DIAGNOSIS — E782 Mixed hyperlipidemia: Secondary | ICD-10-CM | POA: Diagnosis not present

## 2024-05-17 NOTE — Telephone Encounter (Signed)
 Requested medication (s) are due for refill today - unsure  Requested medication (s) are on the active medication list -yes  Future visit scheduled -yes  Last refill: 04/30/24 #30  Notes to clinic: Acute visit Rx- outside provider- does PCP want to continue?   Requested Prescriptions  Pending Prescriptions Disp Refills   pantoprazole  (PROTONIX ) 40 MG tablet [Pharmacy Med Name: Pantoprazole  Sodium 40 MG Oral Tablet Delayed Release] 30 tablet 0    Sig: Take 1 tablet by mouth once daily     Gastroenterology: Proton Pump Inhibitors Passed - 05/17/2024 11:21 AM      Passed - Valid encounter within last 12 months    Recent Outpatient Visits           3 weeks ago Primary hypertension   [facility] [place], Megan P, DO   3 weeks ago Epigastric pain   [facility], Vinay K, MD   3 months ago Routine general medical examination at a health care facility   [facility], [place] P, DO   6 months ago Osteoarthritis of spine with radiculopathy, lumbar region   [facility], Megan P, DO   9 months ago Mixed hyperlipidemia   [facility] [facility] [place], [place] P, DO                 Requested Prescriptions  Pending Prescriptions Disp Refills   pantoprazole  (PROTONIX ) 40 MG tablet [Pharmacy Med Name: Pantoprazole  Sodium 40 MG Oral Tablet Delayed Release] 30 tablet 0    Sig: Take 1 tablet by mouth once daily     Gastroenterology: Proton Pump Inhibitors Passed - 05/17/2024 11:21 AM      Passed - Valid encounter within last 12 months    Recent Outpatient Visits           3 weeks ago Primary hypertension   [facility] [place], Megan P, DO   3 weeks ago Epigastric pain   [facility], Vinay K, MD   3 months ago Routine general medical examination  at a health care facility   [facility], [place] P, DO   6 months ago Osteoarthritis of spine with radiculopathy, lumbar region   [facility] [place], Megan P, DO   9 months ago Mixed hyperlipidemia    [facility] [place], [place], DO

## 2024-05-17 NOTE — Patient Instructions (Signed)
 Medication Instructions:  Your physician recommends that you continue on your current medications as directed. Please refer to the Current Medication list given to you today.  *If you need a refill on your cardiac medications before your next appointment, please call your pharmacy*  Lab Work: No labs ordered today  If you have labs (blood work) drawn today and your tests are completely normal, you will receive your results only by: MyChart Message (if you have MyChart) OR A paper copy in the mail If you have any lab test that is abnormal or we need to change your treatment, we will call you to review the results.  Testing/Procedures: No test ordered today   Follow-Up: At [facility], you and your health needs are our priority.  As part of our continuing mission to provide you with exceptional heart care, our providers are all part of one team.  This team includes your primary Cardiologist (physician) and Advanced Practice Providers or APPs (Physician Assistants and Nurse Practitioners) who all work together to provide you with the care you need, when you need it.  Your next appointment:   12 month(s)  Provider:   Caron Poser, MD    We recommend signing up for the patient portal called MyChart.  Sign up information is provided on this After Visit Summary.  MyChart is used to connect with patients for Virtual Visits (Telemedicine).  Patients are able to view lab/test results, encounter notes, upcoming appointments, etc.  Non-urgent messages can be sent to your provider as well.   To learn more about what you can do with MyChart, go to [email or web address].

## 2024-05-24 ENCOUNTER — Encounter: Payer: Self-pay | Admitting: Family Medicine

## 2024-05-24 ENCOUNTER — Ambulatory Visit: Admitting: Family Medicine

## 2024-05-24 VITALS — BP 170/100 | HR 53 | Temp 97.7°F | Ht 69.0 in | Wt 190.6 lb

## 2024-05-24 DIAGNOSIS — I10 Essential (primary) hypertension: Secondary | ICD-10-CM | POA: Diagnosis not present

## 2024-05-24 DIAGNOSIS — E782 Mixed hyperlipidemia: Secondary | ICD-10-CM

## 2024-05-24 NOTE — Assessment & Plan Note (Signed)
 Rechecking labs today. Await results. Treat as needed.

## 2024-05-24 NOTE — Assessment & Plan Note (Signed)
 Not doing well. Will increase his losartan  and recheck in 1 month. Call with any concerns.

## 2024-05-24 NOTE — Progress Notes (Signed)
 BP (!) 170/100   Pulse (!) 53   Temp 97.7 F (36.5 C) (Oral)   Ht 5' 9 (1.753 m)   Wt 190 lb 9.6 oz (86.5 kg)   SpO2 97%   BMI 28.15 kg/m    Subjective:    Patient ID: Jacob Key, male    DOB: [birth year], 43 y.o.   MRN: [id number]  HPI: Jacob Key is a 43 y.o. male  Chief Complaint  Patient presents with   Hypertension   HYPERTENSION / HYPERLIPIDEMIA Satisfied with current treatment? yes Duration of hypertension: chronic BP monitoring frequency: a few times a month BP range: 130s-150s systolic BP medication side effects: no Past BP meds: losartan  Duration of hyperlipidemia: chronic Cholesterol medication side effects: no Cholesterol supplements: none Past cholesterol medications: crestor  Medication compliance: excellent compliance Aspirin: no Recent stressors: no Recurrent headaches: no Visual changes: no Palpitations: no Dyspnea: no Chest pain: no Lower extremity edema: no Dizzy/lightheaded: no  Relevant past medical, surgical, family and social history reviewed and updated as indicated. Interim medical history since our last visit reviewed. Allergies and medications reviewed and updated.  Review of Systems  Constitutional: Negative.   Respiratory: Negative.    Cardiovascular: Negative.   Musculoskeletal: Negative.   Neurological:  Positive for headaches. Negative for dizziness, tremors, seizures, syncope, facial asymmetry, speech difficulty, weakness, light-headedness and numbness.  Psychiatric/Behavioral: Negative.      Per HPI unless specifically indicated above     Objective:    BP (!) 170/100   Pulse (!) 53   Temp 97.7 F (36.5 C) (Oral)   Ht 5' 9 (1.753 m)   Wt 190 lb 9.6 oz (86.5 kg)   SpO2 97%   BMI 28.15 kg/m   Wt Readings from Last 3 Encounters:  05/24/24 190 lb 9.6 oz (86.5 kg)  05/17/24 190 lb (86.2 kg)  04/23/24 192 lb (87.1 kg)    Physical Exam Vitals and nursing note reviewed.  Constitutional:      General: He  is not in acute distress.    Appearance: Normal appearance. He is not ill-appearing, toxic-appearing or diaphoretic.  HENT:     Head: Normocephalic and atraumatic.     Right Ear: External ear normal.     Left Ear: External ear normal.     Nose: Nose normal.     Mouth/Throat:     Mouth: Mucous membranes are moist.     Pharynx: Oropharynx is clear.  Eyes:     General: No scleral icterus.       Right eye: No discharge.        Left eye: No discharge.     Extraocular Movements: Extraocular movements intact.     Conjunctiva/sclera: Conjunctivae normal.     Pupils: Pupils are equal, round, and reactive to light.  Cardiovascular:     Rate and Rhythm: Normal rate and regular rhythm.     Pulses: Normal pulses.     Heart sounds: Normal heart sounds. No murmur heard.    No friction rub. No gallop.  Pulmonary:     Effort: Pulmonary effort is normal. No respiratory distress.     Breath sounds: Normal breath sounds. No stridor. No wheezing, rhonchi or rales.  Chest:     Chest wall: No tenderness.  Musculoskeletal:        General: Normal range of motion.     Cervical back: Normal range of motion and neck supple.  Skin:    General: Skin is warm and dry.  Capillary Refill: Capillary refill takes less than 2 seconds.     Coloration: Skin is not jaundiced or pale.     Findings: No bruising, erythema, lesion or rash.  Neurological:     General: No focal deficit present.     Mental Status: He is alert and oriented to person, place, and time. Mental status is at baseline.  Psychiatric:        Mood and Affect: Mood normal.        Behavior: Behavior normal.        Thought Content: Thought content normal.        Judgment: Judgment normal.     Results for orders placed or performed during the hospital encounter of 04/22/24  Lipase, blood   Collection Time: 04/22/24  2:48 PM  Result Value Ref Range   Lipase 45 11 - 51 U/L  Comprehensive metabolic panel   Collection Time: 04/22/24  2:48 PM   Result Value Ref Range   Sodium 140 135 - 145 mmol/L   Potassium 4.6 3.5 - 5.1 mmol/L   Chloride 100 98 - 111 mmol/L   CO2 27 22 - 32 mmol/L   Glucose, Bld 109 (H) 70 - 99 mg/dL   BUN 24 (H) 6 - 20 mg/dL   Creatinine, Ser 9.06 0.61 - 1.24 mg/dL   Calcium  9.5 8.9 - 10.3 mg/dL   Total Protein 8.3 (H) 6.5 - 8.1 g/dL   Albumin 4.3 3.5 - 5.0 g/dL   AST 35 15 - 41 U/L   ALT 37 0 - 44 U/L   Alkaline Phosphatase 57 38 - 126 U/L   Total Bilirubin 0.9 0.0 - 1.2 mg/dL   GFR, Estimated >39 >39 mL/min   Anion gap 13 5 - 15  CBC   Collection Time: 04/22/24  2:48 PM  Result Value Ref Range   WBC 5.2 4.0 - 10.5 K/uL   RBC 4.86 4.22 - 5.81 MIL/uL   Hemoglobin 15.3 13.0 - 17.0 g/dL   HCT 55.6 60.9 - 47.9 %   MCV 91.2 80.0 - 100.0 fL   MCH 31.5 26.0 - 34.0 pg   MCHC 34.5 30.0 - 36.0 g/dL   RDW 88.1 88.4 - 84.4 %   Platelets 250 150 - 400 K/uL   nRBC 0.0 0.0 - 0.2 %  Urinalysis, Routine w reflex microscopic -Urine, Clean Catch   Collection Time: 04/22/24  2:48 PM  Result Value Ref Range   Color, Urine STRAW (A) YELLOW   APPearance CLEAR (A) CLEAR   Specific Gravity, Urine 1.014 1.005 - 1.030   pH 7.0 5.0 - 8.0   Glucose, UA NEGATIVE NEGATIVE mg/dL   Hgb urine dipstick NEGATIVE NEGATIVE   Bilirubin Urine NEGATIVE NEGATIVE   Ketones, ur NEGATIVE NEGATIVE mg/dL   Protein, ur NEGATIVE NEGATIVE mg/dL   Nitrite NEGATIVE NEGATIVE   Leukocytes,Ua NEGATIVE NEGATIVE  Troponin I (High Sensitivity)   Collection Time: 04/22/24  2:48 PM  Result Value Ref Range   Troponin I (High Sensitivity) 4 <18 ng/L  Troponin I (High Sensitivity)   Collection Time: 04/22/24  7:18 PM  Result Value Ref Range   Troponin I (High Sensitivity) 8 <18 ng/L      Assessment & Plan:   Problem List Items Addressed This Visit       Cardiovascular and Mediastinum   Primary hypertension - Primary   Not doing well. Will increase his losartan  and recheck in 1 month. Call with any concerns.  Relevant  Medications   losartan  (COZAAR ) 100 MG tablet   Other Relevant Orders   Comprehensive metabolic panel with GFR     Other   Hyperlipidemia   Rechecking labs today. Await results. Treat as needed.       Relevant Medications   losartan  (COZAAR ) 100 MG tablet   Other Relevant Orders   Lipid Panel w/o Chol/HDL Ratio     Follow up plan: Return in about 4 weeks (around 06/21/2024).

## 2024-05-25 ENCOUNTER — Ambulatory Visit: Payer: Self-pay | Admitting: Family Medicine

## 2024-05-25 LAB — COMPREHENSIVE METABOLIC PANEL WITH GFR
ALT: 26 IU/L (ref 0–44)
AST: 27 IU/L (ref 0–40)
Albumin: 4.6 g/dL (ref 4.1–5.1)
Alkaline Phosphatase: 62 IU/L (ref 47–123)
BUN/Creatinine Ratio: 18 (ref 9–20)
BUN: 17 mg/dL (ref 6–24)
Bilirubin Total: 0.3 mg/dL (ref 0.0–1.2)
CO2: 27 mmol/L (ref 20–29)
Calcium: 9.4 mg/dL (ref 8.7–10.2)
Chloride: 99 mmol/L (ref 96–106)
Creatinine, Ser: 0.95 mg/dL (ref 0.76–1.27)
Globulin, Total: 2.1 g/dL (ref 1.5–4.5)
Glucose: 93 mg/dL (ref 70–99)
Potassium: 4.2 mmol/L (ref 3.5–5.2)
Sodium: 138 mmol/L (ref 134–144)
Total Protein: 6.7 g/dL (ref 6.0–8.5)
eGFR: 102 mL/min/1.73 (ref >59)

## 2024-05-25 LAB — LIPID PANEL W/O CHOL/HDL RATIO
Cholesterol, Total: 168 mg/dL (ref 100–199)
HDL: 53 mg/dL (ref >39)
LDL Chol Calc (NIH): 102 mg/dL — ABNORMAL HIGH (ref 0–99)
Triglycerides: 68 mg/dL (ref 0–149)
VLDL Cholesterol Cal: 13 mg/dL (ref 5–40)

## 2024-06-15 ENCOUNTER — Other Ambulatory Visit: Payer: Self-pay | Admitting: Family Medicine

## 2024-06-15 ENCOUNTER — Encounter: Payer: Self-pay | Admitting: Family Medicine

## 2024-06-15 DIAGNOSIS — M1612 Unilateral primary osteoarthritis, left hip: Secondary | ICD-10-CM

## 2024-06-15 DIAGNOSIS — R1013 Epigastric pain: Secondary | ICD-10-CM

## 2024-06-21 ENCOUNTER — Other Ambulatory Visit

## 2024-06-22 ENCOUNTER — Ambulatory Visit: Admitting: Student

## 2024-06-25 ENCOUNTER — Other Ambulatory Visit

## 2024-06-29 ENCOUNTER — Ambulatory Visit: Admitting: Family Medicine

## 2024-06-29 ENCOUNTER — Other Ambulatory Visit

## 2024-06-29 ENCOUNTER — Encounter: Payer: Self-pay | Admitting: Family Medicine

## 2024-06-29 VITALS — BP 156/90 | HR 60 | Temp 97.9°F | Ht 69.0 in | Wt 186.0 lb

## 2024-06-29 DIAGNOSIS — I10 Essential (primary) hypertension: Secondary | ICD-10-CM

## 2024-06-29 MED ORDER — AMLODIPINE BESYLATE 5 MG PO TABS: 5.0000 mg | ORAL_TABLET | Freq: Every day | ORAL | 1 refills | Status: AC

## 2024-06-29 NOTE — Assessment & Plan Note (Signed)
 Still running high. Will add in amlodipine  and recheck in about a month. Call with any concerns. Labs drawn today.

## 2024-06-29 NOTE — Progress Notes (Signed)
 "  BP (!) 156/90   Pulse 60   Temp 97.9 F (36.6 C) (Oral)   Ht 5' 9 (1.753 m)   Wt 186 lb (84.4 kg)   SpO2 97%   BMI 27.47 kg/m    Subjective:    Patient ID: Jacob Key, male    DOB: Nov 28, 1980, 44 y.o.   MRN: [id number]  HPI: Jacob Key is a 44 y.o. male  Chief Complaint  Patient presents with   Hypertension    Headaches/ pressure    HYPERTENSION  Hypertension status: better  Satisfied with current treatment? no Duration of hypertension: chronic BP monitoring frequency:  a few times a week BP range: 150s-160s systolic BP medication side effects:  no Medication compliance: good compliance Previous BP meds:losartan  Aspirin: no Recurrent headaches: yes Visual changes: no Palpitations: no Dyspnea: no Chest pain: no Lower extremity edema: no Dizzy/lightheaded: no   Relevant past medical, surgical, family and social history reviewed and updated as indicated. Interim medical history since our last visit reviewed. Allergies and medications reviewed and updated.  Review of Systems  Constitutional: Negative.   Respiratory: Negative.    Cardiovascular: Negative.   Musculoskeletal: Negative.   Neurological:  Positive for headaches. Negative for dizziness, tremors, seizures, syncope, facial asymmetry, speech difficulty, weakness, light-headedness and numbness.  Psychiatric/Behavioral: Negative.      Per HPI unless specifically indicated above     Objective:    BP (!) 156/90   Pulse 60   Temp 97.9 F (36.6 C) (Oral)   Ht 5' 9 (1.753 m)   Wt 186 lb (84.4 kg)   SpO2 97%   BMI 27.47 kg/m   Wt Readings from Last 3 Encounters:  06/29/24 186 lb (84.4 kg)  05/24/24 190 lb 9.6 oz (86.5 kg)  05/17/24 190 lb (86.2 kg)    Physical Exam Vitals and nursing note reviewed.  Constitutional:      General: He is not in acute distress.    Appearance: Normal appearance. He is not ill-appearing, toxic-appearing or diaphoretic.  HENT:     Head: Normocephalic  and atraumatic.     Right Ear: External ear normal.     Left Ear: External ear normal.     Nose: Nose normal.     Mouth/Throat:     Mouth: Mucous membranes are moist.     Pharynx: Oropharynx is clear.  Eyes:     General: No scleral icterus.       Right eye: No discharge.        Left eye: No discharge.     Extraocular Movements: Extraocular movements intact.     Conjunctiva/sclera: Conjunctivae normal.     Pupils: Pupils are equal, round, and reactive to light.  Cardiovascular:     Rate and Rhythm: Normal rate and regular rhythm.     Pulses: Normal pulses.     Heart sounds: Normal heart sounds. No murmur heard.    No friction rub. No gallop.  Pulmonary:     Effort: Pulmonary effort is normal. No respiratory distress.     Breath sounds: Normal breath sounds. No stridor. No wheezing, rhonchi or rales.  Chest:     Chest wall: No tenderness.  Musculoskeletal:        General: Normal range of motion.     Cervical back: Normal range of motion and neck supple.  Skin:    General: Skin is warm and dry.     Capillary Refill: Capillary refill takes less than 2 seconds.  Coloration: Skin is not jaundiced or pale.     Findings: No bruising, erythema, lesion or rash.  Neurological:     General: No focal deficit present.     Mental Status: He is alert and oriented to person, place, and time. Mental status is at baseline.  Psychiatric:        Mood and Affect: Mood normal.        Behavior: Behavior normal.        Thought Content: Thought content normal.        Judgment: Judgment normal.     Results for orders placed or performed in visit on 05/24/24  Comprehensive metabolic panel with GFR   Collection Time: 05/24/24  9:51 AM  Result Value Ref Range   Glucose 93 70 - 99 mg/dL   BUN 17 6 - 24 mg/dL   Creatinine, Ser 9.04 0.76 - 1.27 mg/dL   eGFR 897 >40 fO/fpw/8.26   BUN/Creatinine Ratio 18 9 - 20   Sodium 138 134 - 144 mmol/L   Potassium 4.2 3.5 - 5.2 mmol/L   Chloride 99 96 -  106 mmol/L   CO2 27 20 - 29 mmol/L   Calcium  9.4 8.7 - 10.2 mg/dL   Total Protein 6.7 6.0 - 8.5 g/dL   Albumin 4.6 4.1 - 5.1 g/dL   Globulin, Total 2.1 1.5 - 4.5 g/dL   Bilirubin Total 0.3 0.0 - 1.2 mg/dL   Alkaline Phosphatase 62 47 - 123 IU/L   AST 27 0 - 40 IU/L   ALT 26 0 - 44 IU/L  Lipid Panel w/o Chol/HDL Ratio   Collection Time: 05/24/24  9:51 AM  Result Value Ref Range   Cholesterol, Total 168 100 - 199 mg/dL   Triglycerides 68 0 - 149 mg/dL   HDL 53 >60 mg/dL   VLDL Cholesterol Cal 13 5 - 40 mg/dL   LDL Chol Calc (NIH) 897 (H) 0 - 99 mg/dL      Assessment & Plan:   Problem List Items Addressed This Visit       Cardiovascular and Mediastinum   Primary hypertension - Primary   Still running high. Will add in amlodipine  and recheck in about a month. Call with any concerns. Labs drawn today.       Relevant Medications   amLODipine  (NORVASC ) 5 MG tablet   Other Relevant Orders   Basic metabolic panel with GFR     Follow up plan: Return for As scheduled.      "

## 2024-06-30 LAB — BASIC METABOLIC PANEL WITH GFR
BUN/Creatinine Ratio: 19 (ref 9–20)
BUN: 19 mg/dL (ref 6–24)
CO2: 24 mmol/L (ref 20–29)
Calcium: 9.6 mg/dL (ref 8.7–10.2)
Chloride: 100 mmol/L (ref 96–106)
Creatinine, Ser: 0.99 mg/dL (ref 0.76–1.27)
Glucose: 92 mg/dL (ref 70–99)
Potassium: 4.7 mmol/L (ref 3.5–5.2)
Sodium: 139 mmol/L (ref 134–144)
eGFR: 97 mL/min/1.73

## 2024-07-01 ENCOUNTER — Ambulatory Visit: Payer: Self-pay | Admitting: Family Medicine

## 2024-07-03 ENCOUNTER — Other Ambulatory Visit

## 2024-07-05 ENCOUNTER — Ambulatory Visit: Payer: Self-pay

## 2024-07-05 DIAGNOSIS — K316 Fistula of stomach and duodenum: Secondary | ICD-10-CM | POA: Diagnosis not present

## 2024-07-05 DIAGNOSIS — K2950 Unspecified chronic gastritis without bleeding: Secondary | ICD-10-CM | POA: Diagnosis present

## 2024-07-10 ENCOUNTER — Inpatient Hospital Stay: Admission: RE | Admit: 2024-07-10 | Source: Ambulatory Visit

## 2024-07-15 ENCOUNTER — Other Ambulatory Visit: Payer: Self-pay | Admitting: Family Medicine

## 2024-07-15 DIAGNOSIS — R1013 Epigastric pain: Secondary | ICD-10-CM

## 2024-07-16 ENCOUNTER — Ambulatory Visit: Admitting: Family Medicine

## 2024-07-17 NOTE — Telephone Encounter (Signed)
 Requested Prescriptions  Pending Prescriptions Disp Refills   pantoprazole  (PROTONIX ) 40 MG tablet [Pharmacy Med Name: Pantoprazole  Sodium 40 MG Oral Tablet Delayed Release] 30 tablet 0    Sig: Take 1 tablet by mouth once daily     Gastroenterology: Proton Pump Inhibitors Passed - 07/17/2024 11:31 AM      Passed - Valid encounter within last 12 months    Recent Outpatient Visits           2 weeks ago Primary hypertension   [facility], [employer], DO   1 month ago Primary hypertension   [facility], [place], DO   2 months ago Primary hypertension   [facility] [place], Megan P, DO   2 months ago Epigastric pain   [facility], Vinay K, MD   5 months ago Routine general medical examination at a health care facility   [facility] [place], [place]
